# Patient Record
Sex: Female | Born: 1935 | Race: White | Hispanic: No | Smoking: Current every day smoker
Health system: Southern US, Community
[De-identification: ages and names within clinical notes are randomized; demographics above are authoritative.]

## PROBLEM LIST (undated history)

## (undated) DIAGNOSIS — R51 Headache: Secondary | ICD-10-CM

## (undated) DIAGNOSIS — R0600 Dyspnea, unspecified: Secondary | ICD-10-CM

## (undated) DIAGNOSIS — C801 Malignant (primary) neoplasm, unspecified: Secondary | ICD-10-CM

## (undated) DIAGNOSIS — H9190 Unspecified hearing loss, unspecified ear: Secondary | ICD-10-CM

## (undated) DIAGNOSIS — K219 Gastro-esophageal reflux disease without esophagitis: Secondary | ICD-10-CM

## (undated) DIAGNOSIS — Z8719 Personal history of other diseases of the digestive system: Secondary | ICD-10-CM

## (undated) DIAGNOSIS — I2510 Atherosclerotic heart disease of native coronary artery without angina pectoris: Secondary | ICD-10-CM

## (undated) DIAGNOSIS — M1990 Unspecified osteoarthritis, unspecified site: Secondary | ICD-10-CM

## (undated) DIAGNOSIS — R1310 Dysphagia, unspecified: Secondary | ICD-10-CM

## (undated) DIAGNOSIS — F329 Major depressive disorder, single episode, unspecified: Secondary | ICD-10-CM

## (undated) DIAGNOSIS — I219 Acute myocardial infarction, unspecified: Secondary | ICD-10-CM

## (undated) DIAGNOSIS — R519 Headache, unspecified: Secondary | ICD-10-CM

## (undated) DIAGNOSIS — K5790 Diverticulosis of intestine, part unspecified, without perforation or abscess without bleeding: Secondary | ICD-10-CM

## (undated) DIAGNOSIS — Z972 Presence of dental prosthetic device (complete) (partial): Secondary | ICD-10-CM

## (undated) DIAGNOSIS — K922 Gastrointestinal hemorrhage, unspecified: Secondary | ICD-10-CM

## (undated) DIAGNOSIS — D649 Anemia, unspecified: Secondary | ICD-10-CM

## (undated) DIAGNOSIS — F32A Depression, unspecified: Secondary | ICD-10-CM

## (undated) DIAGNOSIS — J449 Chronic obstructive pulmonary disease, unspecified: Secondary | ICD-10-CM

## (undated) DIAGNOSIS — I10 Essential (primary) hypertension: Secondary | ICD-10-CM

## (undated) DIAGNOSIS — E785 Hyperlipidemia, unspecified: Secondary | ICD-10-CM

## (undated) DIAGNOSIS — M810 Age-related osteoporosis without current pathological fracture: Secondary | ICD-10-CM

## (undated) HISTORY — PX: PILONIDAL CYST EXCISION: SHX744

## (undated) HISTORY — PX: CHOLECYSTECTOMY: SHX55

## (undated) HISTORY — PX: ABDOMINAL HYSTERECTOMY: SHX81

## (undated) HISTORY — PX: ESOPHAGOGASTRODUODENOSCOPY: SHX1529

## (undated) HISTORY — PX: APPENDECTOMY: SHX54

## (undated) HISTORY — PX: GANGLION CYST EXCISION: SHX1691

## (undated) HISTORY — PX: COLONOSCOPY: SHX174

---

## 2004-07-03 ENCOUNTER — Other Ambulatory Visit: Payer: Self-pay

## 2006-08-06 ENCOUNTER — Ambulatory Visit: Payer: Self-pay | Admitting: Family Medicine

## 2007-03-01 ENCOUNTER — Ambulatory Visit: Payer: Self-pay | Admitting: Gastroenterology

## 2007-03-26 ENCOUNTER — Ambulatory Visit: Payer: Self-pay | Admitting: Gastroenterology

## 2007-11-11 ENCOUNTER — Ambulatory Visit: Payer: Self-pay | Admitting: Family Medicine

## 2008-02-08 ENCOUNTER — Ambulatory Visit: Payer: Self-pay

## 2008-05-01 ENCOUNTER — Ambulatory Visit: Payer: Self-pay | Admitting: Family Medicine

## 2008-10-08 ENCOUNTER — Ambulatory Visit: Payer: Self-pay | Admitting: Family Medicine

## 2008-10-12 ENCOUNTER — Ambulatory Visit: Payer: Self-pay | Admitting: Internal Medicine

## 2009-07-11 ENCOUNTER — Ambulatory Visit: Payer: Self-pay | Admitting: Unknown Physician Specialty

## 2010-09-02 ENCOUNTER — Ambulatory Visit: Payer: Self-pay | Admitting: Internal Medicine

## 2010-09-04 ENCOUNTER — Ambulatory Visit: Payer: Self-pay | Admitting: Internal Medicine

## 2010-09-06 ENCOUNTER — Ambulatory Visit: Payer: Self-pay | Admitting: Internal Medicine

## 2010-12-18 ENCOUNTER — Ambulatory Visit: Payer: Self-pay | Admitting: Gastroenterology

## 2010-12-20 LAB — PATHOLOGY REPORT

## 2012-06-15 ENCOUNTER — Observation Stay: Payer: Self-pay | Admitting: Internal Medicine

## 2012-06-15 LAB — COMPREHENSIVE METABOLIC PANEL
Alkaline Phosphatase: 65 U/L (ref 50–136)
Anion Gap: 6 — ABNORMAL LOW (ref 7–16)
Bilirubin,Total: 0.6 mg/dL (ref 0.2–1.0)
Calcium, Total: 8.8 mg/dL (ref 8.5–10.1)
Chloride: 105 mmol/L (ref 98–107)
Co2: 28 mmol/L (ref 21–32)
EGFR (African American): >60
EGFR (Non-African Amer.): 59 — ABNORMAL LOW
Glucose: 76 mg/dL (ref 65–99)
Osmolality: 276 (ref 275–301)
Potassium: 3.5 mmol/L (ref 3.5–5.1)
Sodium: 139 mmol/L (ref 136–145)

## 2012-06-15 LAB — CBC
HCT: 40.9 % (ref 35.0–47.0)
MCHC: 34.6 g/dL (ref 32.0–36.0)
RBC: 4.79 10*6/uL (ref 3.80–5.20)
RDW: 14.5 % (ref 11.5–14.5)
WBC: 7.8 10*3/uL (ref 3.6–11.0)

## 2012-06-15 LAB — CK TOTAL AND CKMB (NOT AT ARMC): CK, Total: 51 U/L (ref 21–215)

## 2012-06-15 LAB — PROTIME-INR: Prothrombin Time: 14.5 secs (ref 11.5–14.7)

## 2012-06-15 LAB — TROPONIN I: Troponin-I: 0.03 ng/mL

## 2012-06-16 LAB — CBC WITH DIFFERENTIAL/PLATELET
Basophil %: 0.6 %
Eosinophil %: 3 %
HGB: 13.5 g/dL (ref 12.0–16.0)
Lymphocyte #: 1.9 10*3/uL (ref 1.0–3.6)
MCV: 85 fL (ref 80–100)
Monocyte %: 9.7 %
Neutrophil %: 57.3 %
Platelet: 149 10*3/uL — ABNORMAL LOW (ref 150–440)
RBC: 4.68 10*6/uL (ref 3.80–5.20)
RDW: 14.3 % (ref 11.5–14.5)
WBC: 6.6 10*3/uL (ref 3.6–11.0)

## 2012-06-16 LAB — BASIC METABOLIC PANEL
Anion Gap: 6 — ABNORMAL LOW (ref 7–16)
Co2: 27 mmol/L (ref 21–32)
Creatinine: 0.94 mg/dL (ref 0.60–1.30)
EGFR (African American): >60
EGFR (Non-African Amer.): 58 — ABNORMAL LOW
Glucose: 91 mg/dL (ref 65–99)
Osmolality: 284 (ref 275–301)
Potassium: 4.2 mmol/L (ref 3.5–5.1)
Sodium: 143 mmol/L (ref 136–145)

## 2012-06-16 LAB — HEMOGLOBIN A1C: Hemoglobin A1C: 5 % (ref 4.2–6.3)

## 2012-06-16 LAB — CK TOTAL AND CKMB (NOT AT ARMC)
CK, Total: 56 U/L (ref 21–215)
CK, Total: 64 U/L (ref 21–215)
CK-MB: 1 ng/mL (ref 0.5–3.6)

## 2012-06-16 LAB — MAGNESIUM: Magnesium: 2 mg/dL

## 2012-06-16 LAB — LIPID PANEL
Cholesterol: 123 mg/dL (ref 0–200)
Triglycerides: 147 mg/dL (ref 0–200)

## 2012-06-16 LAB — TROPONIN I: Troponin-I: 0.03 ng/mL

## 2012-11-05 ENCOUNTER — Ambulatory Visit: Payer: Self-pay | Admitting: Gastroenterology

## 2012-11-25 ENCOUNTER — Ambulatory Visit: Payer: Self-pay | Admitting: Gastroenterology

## 2013-11-27 ENCOUNTER — Inpatient Hospital Stay: Payer: Self-pay | Admitting: Internal Medicine

## 2013-11-27 LAB — CBC
HCT: 39.1 % (ref 35.0–47.0)
HGB: 12.8 g/dL (ref 12.0–16.0)
MCH: 28.3 pg (ref 26.0–34.0)
MCHC: 32.7 g/dL (ref 32.0–36.0)
MCV: 86 fL (ref 80–100)
Platelet: 133 10*3/uL — ABNORMAL LOW (ref 150–440)
RBC: 4.53 10*6/uL (ref 3.80–5.20)
RDW: 14.4 % (ref 11.5–14.5)
WBC: 7 10*3/uL (ref 3.6–11.0)

## 2013-11-27 LAB — URINALYSIS, COMPLETE
BACTERIA: NONE SEEN
BLOOD: NEGATIVE
Bilirubin,UR: NEGATIVE
GLUCOSE, UR: NEGATIVE mg/dL (ref 0–75)
Ketone: NEGATIVE
Leukocyte Esterase: NEGATIVE
Nitrite: NEGATIVE
Ph: 5 (ref 4.5–8.0)
Protein: 30
RBC,UR: 1 /HPF (ref 0–5)
SPECIFIC GRAVITY: 1.021 (ref 1.003–1.030)
Squamous Epithelial: <1
WBC UR: 2 /HPF (ref 0–5)

## 2013-11-27 LAB — BASIC METABOLIC PANEL
Anion Gap: 5 — ABNORMAL LOW (ref 7–16)
BUN: 16 mg/dL (ref 7–18)
CO2: 25 mmol/L (ref 21–32)
Calcium, Total: 8.1 mg/dL — ABNORMAL LOW (ref 8.5–10.1)
Chloride: 108 mmol/L — ABNORMAL HIGH (ref 98–107)
Creatinine: 1.13 mg/dL (ref 0.60–1.30)
EGFR (African American): 54 — ABNORMAL LOW
EGFR (Non-African Amer.): 46 — ABNORMAL LOW
Glucose: 99 mg/dL (ref 65–99)
Osmolality: 277 (ref 275–301)
Potassium: 4.5 mmol/L (ref 3.5–5.1)
SODIUM: 138 mmol/L (ref 136–145)

## 2013-11-27 LAB — TROPONIN I: Troponin-I: 0.03 ng/mL

## 2013-11-28 LAB — BASIC METABOLIC PANEL
ANION GAP: 5 — AB (ref 7–16)
BUN: 18 mg/dL (ref 7–18)
CALCIUM: 8.2 mg/dL — AB (ref 8.5–10.1)
Chloride: 106 mmol/L (ref 98–107)
Co2: 26 mmol/L (ref 21–32)
Creatinine: 1.18 mg/dL (ref 0.60–1.30)
EGFR (African American): 51 — ABNORMAL LOW
EGFR (Non-African Amer.): 44 — ABNORMAL LOW
Glucose: 216 mg/dL — ABNORMAL HIGH (ref 65–99)
Osmolality: 282 (ref 275–301)
Potassium: 3.9 mmol/L (ref 3.5–5.1)
Sodium: 137 mmol/L (ref 136–145)

## 2014-10-04 DIAGNOSIS — E782 Mixed hyperlipidemia: Secondary | ICD-10-CM | POA: Insufficient documentation

## 2015-01-23 NOTE — Discharge Summary (Signed)
PATIENT NAME:  Kari Mcdonald, Kari Mcdonald MR#:  [id number] DATE OF BIRTH:  January 30, 1932  DATE OF ADMISSION:  06/15/2012 DATE OF DISCHARGE:  06/17/2012  DISCHARGE DIAGNOSIS: Noncardiac chest pain.  PRESENTATION AND HISTORY:  81 year old female with past medical history of significant chronic obstructive pulmonary disease, ongoing smoking, history of non-ST elevation myocardial infarction, and hypertension presented to the hospital secondary to chest pain that started this morning. It started like sharp chest pain with some shortness of breath and numbness in the left arm. She called EMS and EMS gave her sublingual nitroglycerin and the pain was relieved with that.  She was brought to the ER and admitted to medical service for further work-up for possible cardiac event. She was admitted under observation.  HOSPITAL COURSE:  All three troponins remained negative and she remained pain-free during the hospital stay. We did a LexiScan nuclear medicine scan checking for any ischemia, which was negative for any ischemia or arrhythmia. She had an ejection fraction of 48%. She was discharged with her regular home medications.  Other medical problems addressed during this hospital stay were: 1. History of hypertension. We continued her home medications like hydrochlorothiazide, triamterene, atenolol, and enalapril. 2. Chronic obstructive pulmonary disease: She was stable and there was no wheezing, so we just continued her Advair and albuterol. 3. History of tobacco use. She was placed on a nicotine patch while in the hospital. 4. Depression. We continued her trazodone.   CONDITION ON DISCHARGE: Stable.   CODE STATUS: FULL CODE.  MEDICATIONS ON DISCHARGE:  1. Atenolol 50 mg once a day.  2. Enalapril 20 mg once a day.  3. Atorvastatin 20 mg once a day.  4. Omeprazole 20 mg delayed-release once a day. 5. Trazodone 100-mg oral tablet, 2 tabs orally once a day. 6. Hydrochlorothiazide/triamterene 50/75 mg once a  day. 7. Aspirin 81 mg once a day.  8. Nicotine patch once a day for 20 days. 9. Fluticasone formoterol 1 puff inhaled 2 times a day.   HOME OXYGEN: Not needed.  DIET: Low fat, low cholesterol, carbohydrate controlled diet.  Consistency regular.   ACTIVITY: As tolerated.  Return to work in one week.  TIMEFRAME FOR FOLLOWUP:  1 to 2 weeks with her primary medical doctor.    ____________________________ Ceasar Lund. Anselm Jungling, MD vgv:bjt D: 06/18/2012 18:38:58 ET T: 06/19/2012 13:57:52 ET JOB#: [id number]  cc: Ceasar Lund. Anselm Jungling, MD, <Dictator> Fonnie Jarvis. Ilene Qua, MD Vaughan Basta MD ELECTRONICALLY SIGNED 06/29/2012 15:26

## 2015-01-23 NOTE — H&P (Signed)
PATIENT NAME:  Kari Mcdonald, Kari Mcdonald MR#:  [id number] DATE OF BIRTH:  Oct 23, 1931  DATE OF ADMISSION:  06/15/2012  ADMITTING PHYSICIAN: Dr. Gladstone Lighter  PRIMARY CARE PHYSICIAN: [facility] in [place]: Chest pain.  HISTORY OF PRESENT ILLNESS: Kari Mcdonald is a 81 year old Caucasian female with past medical history significant for chronic obstructive pulmonary disease, ongoing smoking and history of NSTEMI and hypertension presented to the hospital secondary to chest pain that started this morning. Patient says her pain started like a sharp chest pain when she woke up this morning under her left breast and also she was a little diaphoretic with some shortness of breath and also had numbness of the left arm so called EMS. The pain lasted up until EMS arrived and she received two sublingual nitro pills. Currently she is chest pain free and does not have any other symptoms. No prior cardiac history other than what she describes as an NSTEMI without any cardiac intervention and was told that she had a stress heart attack. She has been at her baseline without any other changes up until this morning.   PAST MEDICAL HISTORY: 1. Hypertension.  2. Early dementia. 3. Gastroesophageal reflux disease.   4. NSTEMI without any cardiac intervention.  5. Chronic obstructive pulmonary disease. 6. Ongoing smoking.   PAST SURGICAL HISTORY:  1. Cholecystectomy.  2. Appendectomy.  3. Pilonidal cyst removal.  4. Hysterectomy.   SOCIAL HISTORY: Lives at home with grandson. Smokes about 1 pack per day, smoked for more than 60 years. No use of alcohol.   FAMILY HISTORY: Mom with Alzheimer's dementia and dad with non-Hodgkin's lymphoma and no history of any cardiac disease in the family.   ALLERGIES TO MEDICATIONS: She is allergic to penicillin.   CURRENT MEDICATIONS AT HOME:  1. Atenolol 50 mg p.o. daily.  2. Atorvastatin 20 mg p.o. daily.  3. Enalapril 20 mg p.o. daily.   4. Hydrochlorothiazide/triamterene 50/75, 1 tablet p.o. daily.  5. Omeprazole 20 mg p.o. daily.  6. Trazodone 200 mg at bedtime.   REVIEW OF SYSTEMS: CONSTITUTIONAL: No fever, fatigue or weakness. EYES: Uses reading glasses. No glaucoma or cataracts or inflammation. ENT: No tinnitus, epistaxis or discharge. Positive for allergies and some runny nose. RESPIRATORY: Positive for chronic cough from chronic obstructive pulmonary disease and also has chronic obstructive pulmonary disease. No wheeze or hemoptysis. CARDIOVASCULAR: Positive for chest pain. No orthopnea, edema, arrhythmia, palpitations or syncope. GASTROINTESTINAL: No nausea, vomiting, diarrhea, abdominal pain, hematemesis, or melena.. Positive for dysphagia and status post esophageal stricture dilatation. GENITOURINARY: No dysuria, hematuria, renal calculus, frequency, or incontinence. ENDOCRINE: No polyuria, nocturia, thyroid problems, heat or cold intolerance. HEMATOLOGY: No anemia, easy bruising or bleeding. SKIN: No acne, rash, or lesions. MUSCULOSKELETAL: No neck, back pain. Positive for arthritis and pain in the knees. NEUROLOGIC: No numbness, weakness, cerebrovascular accident, transient ischemic attack, or seizures. PSYCHOLOGICAL: No anxiety, insomnia. Positive for history of depression, currently not depressed.   PHYSICAL EXAMINATION:  VITAL SIGNS: Temperature 98 degrees Fahrenheit, pulse 51, respirations 20, blood pressure 127/64, pulse oximetry 95% on room air.   GENERAL: Well-built, well-nourished female lying in bed, not in any acute distress.   HEENT: Normocephalic, atraumatic. Pupils equal, round, reacting to light. Anicteric sclerae. Extraocular movements intact. Oropharynx clear without erythema, mass or exudates.   NECK: Supple. No thyromegaly, JVD, or carotid bruits. No lymphadenopathy.   LUNGS: Moving air bilaterally. No wheeze or crackles. No use of accessory muscles for breathing.   CARDIOVASCULAR: S1, S2.  3/6  systolic murmur. No rubs or gallops.   ABDOMEN: Soft, nontender, nondistended. No hepatosplenomegaly. Normal bowel sounds.   EXTREMITIES: No pedal edema. No clubbing or cyanosis. 2+ dorsalis pedis pulses palpable bilaterally.   SKIN: No acne, rash or lesions.   LYMPH: No cervical lymphadenopathy.   NEUROLOGIC: Cranial nerves intact. No focal motor or sensory deficits.   PSYCHOLOGICAL: Patient is awake, alert, oriented x3.   LABORATORY, DIAGNOSTIC AND RADIOLOGICAL DATA: EKG showing sinus tachycardia with second-degree AV block, heart rate of 53. Sodium 139, potassium 3.5, chloride 105, bicarbonate 28, BUN 13, creatinine 0.93, glucose 76, calcium 8.8.  ALT 12, AST 22, alkaline phosphatase 65, total bilirubin 0.6, albumin 3.1, WBC 7.8, hemoglobin 14.1, hematocrit 40.9, platelet count 198. Magnesium 1.6. INR 1.1, troponin 0.03. CK 51, CK-MB less than 0.5, PTT 30.3.   ASSESSMENT AND PLAN: 81 year old female with past medical history of hypertension, chronic obstructive pulmonary disease and ongoing smoking admitted with chest pain.  1. Chest pain. Admit under observation to telemetry especially with prior history of NSTEMI and risk factors. Could be musculoskeletal pain as well, however, pain is now resolved so will get Myoview in a.m. Continue aspirin.  2. Hypertension. Continue home medications of triamterene, HCTZ, atenolol and enalapril. 3. Chronic obstructive pulmonary disease. She, appears stable without any wheezing. No need to start any steroids. Continue Advair and albuterol p.r.n. inhalers. 4. Tobacco use disorder. Counseled for three minutes. Placed on nicotine patch while in the hospital. 5. CODE STATUS: FULL CODE.     TIME SPENT ON ADMISSION: 50 minutes.   ____________________________ Gladstone Lighter, MD rk:cms D: 06/15/2012 16:51:33 ET T: 06/15/2012 17:22:51 ET JOB#: [id number]  cc: Gladstone Lighter, MD, <Dictator> [place]  MD ELECTRONICALLY SIGNED 06/15/2012 20:41

## 2015-01-27 NOTE — H&P (Signed)
PATIENT NAME:  Kari Mcdonald, Kari Mcdonald MR#:  [id number] DATE OF BIRTH:  [birth year]  DATE OF ADMISSION:  11/27/2013  PRIMARY CARE PHYSICIAN:  Dr. Thereasa Distance.   REFERRING PHYSICIAN:  Dr. Lisa Roca.   CHIEF COMPLAINT:  Cough, shortness of breath.   HISTORY OF PRESENT ILLNESS:  Kari Mcdonald is a 81 year old female who comes to the Emergency Department with complaints of cough, shortness of breath for the last one week.  The patient has been having cough with productive sputum of greenish sputum.  Today, the patient was experiencing severe tightness in the chest associated with severe shortness of breath.  The patient was also noted to have a fever of 102.  The patient was given ibuprofen before bringing to the Emergency Department.  Work-up in the Emergency Department with chest x-ray, no acute abnormalities were noted.  The patient was diffusely wheezing, was given breathing treatments.   PAST MEDICAL HISTORY: 1.  Hypertension.  2.  COPD.  3.  Hyperlipidemia.  4.  Gastroesophageal reflux disease.  5.  Depression, anxiety.   PAST SURGICAL HISTORY:  1.  Hysterectomy.  2.  Cholecystectomy.  3.  Appendectomy.  4.  Cyst removed from the arm and supine.   5.  Pilonidal cystectomy.   ALLERGIES:  PENICILLIN.   HOME MEDICATIONS:  1.  Trazodone 100 mg 2 tablets at bedtime.  2.  Prednisone 40 mg for four days. 3.  Omeprazole 20 mg once a day.  4.  Nicotine 21 mcg patch transdermal once a day.  5.  Triamterene/hydrochlorothiazide 1 tablet once a day.  6.  Advair Diskus 1 puff 2 times a day.  7.  Enalapril 20 mg once a day.  8.  Atorvastatin 20 mg once a day.  9.  Atenolol 50 mg once a day.  10.  Aspirin 81 mg once a day.   SOCIAL HISTORY:  Continues to smoke more than 2 packs a day.  Denies drinking alcohol or using illicit drugs.  Lives with her niece.   FAMILY HISTORY:  Father and another sister died from lung cancer.  Another sister died from melanoma.   REVIEW OF  SYSTEMS: CONSTITUTIONAL:  Generalized weakness.  EYES:  No change in vision.  EARS, NOSE, THROAT:  No change in hearing.  RESPIRATORY:  Has cough, shortness of breath.  CARDIOVASCULAR:  No chest pain, palpitations.  GASTROINTESTINAL:  No nausea, vomiting, abdominal pain.   GENITORUINARY:  No dysuria or hematuria.  SKIN:  No rashes or lesions.  ENDOCRINE:  No polyuria or polydipsia.  SKIN:  No rash or lesions.  MUSCULOSKELETAL:  Has joint pains and aches.  NEUROLOGIC:  No weakness or numbness in any part of the body.   PHYSICAL EXAMINATION: GENERAL:  This is a well-built, well-nourished, age-appropriate female lying down in the bed not in distress.  VITAL SIGNS:  Temperature 98.3, pulse 78, blood pressure 139/73, respiratory rate of 16, oxygen saturation is 93% on 2 liters of oxygen.  HEENT:  Head normocephalic, atraumatic.  Eyes, no scleral icterus.  Conjunctivae normal.  Pupils equal and reactive.  Extraocular movements are intact.  Mucous membranes moist.  No pharyngeal erythema.  NECK:  Supple.  No lymphadenopathy.  No JVD.  No carotid bruit.  CHEST:  Has no focal tenderness.  LUNGS:  Bilateral diffuse wheezing.  HEART:  S1, S2 regular.  No murmurs are heard.  Tachycardia.  ABDOMEN:  Bowel sounds plus.  Soft, nontender, nondistended.  No hepatosplenomegaly.  EXTREMITIES:  No pedal edema.  Pulses  2+.  NEUROLOGIC:  The patient is alert, oriented to place, person and time.  Cranial nerves II through XII intact.  Motor 5 by 5 in upper and lower extremities.  SKIN:  No rash or lesions.  MUSCULOSKELETAL:  Good range of motion in all the extremities.   LABORATORY DATA:  UA negative for nitrites and leukocyte esterase.  Chest x-ray, one view portable:  No acute cardiopulmonary disease.  CT head without contrast:  No acute intracranial abnormality, atrophy and chronic small vessel disease.  BMP is completely within normal limits.  CBC completely within normal limits.   ASSESSMENT AND PLAN:   Kari Mcdonald is a 81 year old female who comes to the Emergency Department with chronic obstructive pulmonary disease exacerbation.  1.  Chronic obstructive pulmonary disease exacerbation:  Continue with the breathing treatments, Solu-Medrol, Rocephin and Zithromax.  2.  Tobacco use.  Counseled with the patient.  3.  Hypertension.  Continue the home medications.  4.  Keep the patient on deep vein thrombosis prophylaxis with Lovenox.   TIME SPENT:  45 minutes.   ____________________________ Monica Becton, MD pv:ea D: 11/27/2013 22:58:53 ET T: 11/28/2013 02:13:45 ET JOB#: [id number]  cc: Monica Becton, MD, <Dictator> Sofie Hartigan, MD Monica Becton MD ELECTRONICALLY SIGNED 12/08/2013 1:31

## 2015-01-27 NOTE — Discharge Summary (Signed)
PATIENT NAME:  Kari Mcdonald, Kari Mcdonald MR#:  [id number] DATE OF BIRTH:  12/07/1931  DATE OF ADMISSION:  11/27/2013 DATE OF DISCHARGE:  11/29/2013  DISCHARGE DIAGNOSES: 1. Chronic obstructive pulmonary disease exacerbation.  2. Hypertension.  3. Hyperlipidemia.  4. Tobacco abuse.   CONDITION ON DISCHARGE: Stable.   CODE STATUS: FULL CODE   DISCHARGE MEDICATIONS: 1. Enalapril 20 mg oral once a day.  2. Atorvastatin 20 mg oral once a day.  3. Trazodone 100 mg 2 tablets once a day.  4. Hydrochlorothiazide and triamterene 50/75 mg once a day.  5. Aspirin 81 mg once a day  6. Prednisone 10 mg oral tablet, start at 60 mg and taper x 10 mg daily until complete.  7. Spiriva 18 mcg inhalation capsule once a day  8. Advair 1 puff inhalation 2 times a day.  9. ProAir 2 puffs inhalation 4 times a day as needed for shortness of breath. 11. Nicotine patch once a day for 15 days.   HOME HEALTH ON DISCHARGE: Yes. Advised to have physical therapy and nurse aide on discharge.  DIET ON DISCHARGE: Low-sodium, low-fat, low-cholesterol, regular consistency diet.   ACTIVITY: As tolerated.   Advised to follow routine follow-ups with primary care physician within 1 to 2 weeks.   HISTORY OF PRESENT ILLNESS: The patient is a 81 year old female came to the Emergency Department with complaint of cough and shortness of breath for the last one week. She had been having cough with productive sputum with greenish sputum, was also experiencing severe tightness in her chest, associated with shortness of breath and fever of 102 degrees Fahrenheit. In ER initial work-up chest x-ray there was no abnormality. The patient was diffuse wheezing and so given breathing treatment and admitted to medical service.  HOSPITAL COURSE AND STAY:  1. Chronic obstructive pulmonary disease exacerbation. Breathing treatment, and Solu-Medrol and IV ceftriaxone and azithromycin and the patient with significant improvement in overall  condition and so discharged home with tapering steroid and oral antibiotic.  2. Hypertension. We continued home medication.  3. Hyperlipidemia. Continue atorvastatin. 4. Smoking.  Counseling was done for smoking cessation.   IMPORTANT LABORATORY RESULTS IN THE HOSPITAL STAY: WBC count was 7, hemoglobin was 12.8, platelet count was 133, MCV was 86. Troponin 0.03, glucose 99, creatinine 1.13, sodium 138, potassium 4.5, chloride 108 and CO2 of 28.   Urinalysis grossly negative.   CT of the chest, PA and lateral, no acute abnormality.   TOTAL TIME SPENT ON THIS DISCHARGE: 40 minutes.   ____________________________ Ceasar Lund Anselm Jungling, MD vgv:sg D: 12/03/2013 21:37:40 ET T: 12/04/2013 07:28:44 ET JOB#: [id number]  cc: Ceasar Lund. Anselm Jungling, MD, <Dictator> Vaughan Basta MD ELECTRONICALLY SIGNED 12/12/2013 7:46

## 2015-08-09 ENCOUNTER — Ambulatory Visit
Admission: EM | Admit: 2015-08-09 | Discharge: 2015-08-09 | Disposition: A | Discharge status: Home / Self Care | Payer: Medicare Other | Attending: Family Medicine | Admitting: Family Medicine

## 2015-08-09 ENCOUNTER — Other Ambulatory Visit: Payer: Self-pay

## 2015-08-09 ENCOUNTER — Ambulatory Visit (INDEPENDENT_AMBULATORY_CARE_PROVIDER_SITE_OTHER): Payer: Medicare Other

## 2015-08-09 ENCOUNTER — Encounter: Payer: Self-pay | Admitting: Emergency Medicine

## 2015-08-09 DIAGNOSIS — J45901 Unspecified asthma with (acute) exacerbation: Secondary | ICD-10-CM | POA: Insufficient documentation

## 2015-08-09 DIAGNOSIS — F17200 Nicotine dependence, unspecified, uncomplicated: Secondary | ICD-10-CM | POA: Insufficient documentation

## 2015-08-09 DIAGNOSIS — R05 Cough: Secondary | ICD-10-CM | POA: Diagnosis present

## 2015-08-09 DIAGNOSIS — K2990 Gastroduodenitis, unspecified, without bleeding: Secondary | ICD-10-CM | POA: Diagnosis not present

## 2015-08-09 DIAGNOSIS — R0602 Shortness of breath: Secondary | ICD-10-CM

## 2015-08-09 DIAGNOSIS — J441 Chronic obstructive pulmonary disease with (acute) exacerbation: Secondary | ICD-10-CM | POA: Diagnosis not present

## 2015-08-09 DIAGNOSIS — R109 Unspecified abdominal pain: Secondary | ICD-10-CM | POA: Insufficient documentation

## 2015-08-09 DIAGNOSIS — K2970 Gastritis, unspecified, without bleeding: Secondary | ICD-10-CM

## 2015-08-09 DIAGNOSIS — J449 Chronic obstructive pulmonary disease, unspecified: Secondary | ICD-10-CM | POA: Diagnosis present

## 2015-08-09 HISTORY — DX: Essential (primary) hypertension: I10

## 2015-08-09 MED ORDER — IPRATROPIUM-ALBUTEROL 0.5-2.5 (3) MG/3ML IN SOLN
3.0000 mL | Freq: Once | RESPIRATORY_TRACT | Status: AC
  Administered 2015-08-09: 3 mL via RESPIRATORY_TRACT

## 2015-08-09 MED ORDER — GI COCKTAIL ~~LOC~~
30.0000 mL | Freq: Once | ORAL | Status: AC
  Administered 2015-08-09: 30 mL via ORAL

## 2015-08-09 MED ORDER — PREDNISONE 10 MG (21) PO TBPK: ORAL_TABLET | ORAL | Status: DC

## 2015-08-09 MED ORDER — BENZONATATE 200 MG PO CAPS: 200.0000 mg | ORAL_CAPSULE | Freq: Three times a day (TID) | ORAL | Status: DC | PRN

## 2015-08-09 MED ORDER — SUCRALFATE 1 G PO TABS: 2.0000 g | ORAL_TABLET | Freq: Two times a day (BID) | ORAL | Status: DC

## 2015-08-09 MED ORDER — METHYLPREDNISOLONE SODIUM SUCC 125 MG IJ SOLR
125.0000 mg | Freq: Once | INTRAMUSCULAR | Status: AC
  Administered 2015-08-09: 125 mg via INTRAMUSCULAR

## 2015-08-09 MED ORDER — AZITHROMYCIN 250 MG PO TABS: ORAL_TABLET | ORAL | Status: DC

## 2015-08-09 NOTE — ED Notes (Signed)
Pt with sob x days and cough

## 2015-08-09 NOTE — Discharge Instructions (Signed)
Abdominal Pain, Adult Many things can cause belly (abdominal) pain. Most times, the belly pain is not dangerous. Many cases of belly pain can be watched and treated at home. HOME CARE   Do not take medicines that help you go poop (laxatives) unless told to by your doctor.  Only take medicine as told by your doctor.  Eat or drink as told by your doctor. Your doctor will tell you if you should be on a special diet. GET HELP IF:  You do not know what is causing your belly pain.  You have belly pain while you are sick to your stomach (nauseous) or have runny poop (diarrhea).  You have pain while you pee or poop.  Your belly pain wakes you up at night.  You have belly pain that gets worse or better when you eat.  You have belly pain that gets worse when you eat fatty foods.  You have a fever. GET HELP RIGHT AWAY IF:   The pain does not go away within 2 hours.  You keep throwing up (vomiting).  The pain changes and is only in the right or left part of the belly.  You have bloody or tarry looking poop. MAKE SURE YOU:   Understand these instructions.  Will watch your condition.  Will get help right away if you are not doing well or get worse.   This information is not intended to replace advice given to you by your health care provider. Make sure you discuss any questions you have with your health care provider.   Document Released: 03/10/2008 Document Revised: 10/13/2014 Document Reviewed: 06/01/2013 Elsevier Interactive Patient Education 2016 [employer]  Chronic Obstructive Pulmonary Disease Exacerbation Chronic obstructive pulmonary disease (COPD) is a common lung problem. In COPD, the flow of air from the lungs is limited. COPD exacerbations are times that breathing gets worse and you need extra treatment. Without treatment they can be life threatening. If they happen often, your lungs can become more damaged. If your COPD gets worse, your doctor may treat you  with:  Medicines.  Oxygen.  Different ways to clear your airway, such as using a mask. HOME CARE  Do not smoke.  Avoid tobacco smoke and other things that bother your lungs.  If given, take your antibiotic medicine as told. Finish the medicine even if you start to feel better.  Only take medicines as told by your doctor.  Drink enough fluids to keep your pee (urine) clear or pale yellow (unless your doctor has told you not to).  Use a cool mist machine (vaporizer).  If you use oxygen or a machine that turns liquid medicine into a mist (nebulizer), continue to use them as told.  Keep up with shots (vaccinations) as told by your doctor.  Exercise regularly.  Eat healthy foods.  Keep all doctor visits as told. GET HELP RIGHT AWAY IF:  You are very short of breath and it gets worse.  You have trouble talking.  You have bad chest pain.  You have blood in your spit (sputum).  You have a fever.  You keep throwing up (vomiting).  You feel weak, or you pass out (faint).  You feel confused.  You keep getting worse. MAKE SURE YOU:  Understand these instructions.  Will watch your condition.  Will get help right away if you are not doing well or get worse.   This information is not intended to replace advice given to you by your health care provider. Make  sure you discuss any questions you have with your health care provider.   Document Released: 09/11/2011 Document Revised: 10/13/2014 Document Reviewed: 05/27/2013 Elsevier Interactive Patient Education 2016 [employer]  Chronic Obstructive Pulmonary Disease Chronic obstructive pulmonary disease (COPD) is a common lung problem. In COPD, the flow of air from the lungs is limited. The way your lungs work will probably never return to normal, but there are things you can do to improve your lungs and make yourself feel better. Your doctor may treat your condition with:  Medicines.  Oxygen.  Lung  surgery.  Changes to your diet.  Rehabilitation. This may involve a team of specialists. HOME CARE  Take all medicines as told by your doctor.  Avoid medicines or cough syrups that dry up your airway (such as antihistamines) and do not allow you to get rid of thick spit. You do not need to avoid them if told differently by your doctor.  If you smoke, stop. Smoking makes the problem worse.  Avoid being around things that make your breathing worse (like smoke, chemicals, and fumes).  Use oxygen therapy and therapy to help improve your lungs (pulmonary rehabilitation) if told by your doctor. If you need home oxygen therapy, ask your doctor if you should buy a tool to measure your oxygen level (oximeter).  Avoid people who have a sickness you can catch (contagious).  Avoid going outside when it is very hot, cold, or humid.  Eat healthy foods. Eat smaller meals more often. Rest before meals.  Stay active, but remember to also rest.  Make sure to get all the shots (vaccines) your doctor recommends. Ask your doctor if you need a pneumonia shot.  Learn and use tips on how to relax.  Learn and use tips on how to control your breathing as told by your doctor. Try:  Breathing in (inhaling) through your nose for 1 second. Then, pucker your lips and breath out (exhale) through your lips for 2 seconds.  Putting one hand on your belly (abdomen). Breathe in slowly through your nose for 1 second. Your hand on your belly should move out. Pucker your lips and breathe out slowly through your lips. Your hand on your belly should move in as you breathe out.  Learn and use controlled coughing to clear thick spit from your lungs. The steps are:  Lean your head a little forward.  Breathe in deeply.  Try to hold your breath for 3 seconds.  Keep your mouth slightly open while coughing 2 times.  Spit any thick spit out into a tissue.  Rest and do the steps again 1 or 2 times as needed. GET HELP  IF:  You cough up more thick spit than usual.  There is a change in the color or thickness of the spit.  It is harder to breathe than usual.  Your breathing is faster than usual. GET HELP RIGHT AWAY IF:  You have shortness of breath while resting.  You have shortness of breath that stops you from:  Being able to talk.  Doing normal activities.  You chest hurts for longer than 5 minutes.  Your skin color is more blue than usual.  Your pulse oximeter shows that you have low oxygen for longer than 5 minutes. MAKE SURE YOU:  Understand these instructions.  Will watch your condition.  Will get help right away if you are not doing well or get worse.   This information is not intended to replace advice given to you  by your health care provider. Make sure you discuss any questions you have with your health care provider.   Document Released: 03/10/2008 Document Revised: 10/13/2014 Document Reviewed: 05/19/2013 Elsevier Interactive Patient Education 2016 [place] of Breath Shortness of breath means you have trouble breathing. Shortness of breath needs medical care right away. HOME CARE   Do not smoke.  Avoid being around chemicals or things (paint fumes, dust) that may bother your breathing.  Rest as needed. Slowly begin your normal activities.  Only take medicines as told by your doctor.  Keep all doctor visits as told. GET HELP RIGHT AWAY IF:   Your shortness of breath gets worse.  You feel lightheaded, pass out (faint), or have a cough that is not helped by medicine.  You cough up blood.  You have pain with breathing.  You have pain in your chest, arms, shoulders, or belly (abdomen).  You have a fever.  You cannot walk up stairs or exercise the way you normally do.  You do not get better in the time expected.  You have a hard time doing normal activities even with rest.  You have problems with your medicines.  You have any new  symptoms. MAKE SURE YOU:  Understand these instructions.  Will watch your condition.  Will get help right away if you are not doing well or get worse.   This information is not intended to replace advice given to you by your health care provider. Make sure you discuss any questions you have with your health care provider.   Document Released: 03/10/2008 Document Revised: 09/27/2013 Document Reviewed: 12/08/2011 Elsevier Interactive Patient Education [employer].

## 2015-08-09 NOTE — ED Provider Notes (Signed)
CSN: [id number]     Arrival date & time 08/09/15  1238 History   First MD Initiated Contact with Patient 08/09/15 1311    Nurses notes were reviewed. Chief Complaint  Patient presents with  . Cough   patient is an 81 year old white female history of COPD who comes in shortness of breath and difficult to breathing for about a week. She still smoking and adamantly stresses that she is going to continue to smoke. She has a Spiriva inhaler and a beta agonist inhaler which she has used during this time she states hasn't really helped her much if any. States that she's been coughing the sputum has been clear and no color to it.    she also has a history of indigestion for about 3 weeks states she has difficulty eating and she had increased belching as well. She's tried antiacids over-the-counter and did not help.Marland Kitchen Apparently this morning the indigestion got so bad she called her PCPs office at the [facility]. She told him that she was having difficult to breathing the told to go to the urgent cares to the emergency room as explained to her and her daughter-in-law within the more appropriate place to go.  She denies chest pain and just having wheezing but EKG will be done due to her age and because of epigastric pain even last going on for about 3 weeks.   (Consider location/radiation/quality/duration/timing/severity/associated sxs/prior Treatment) Patient is a 81 y.o. female presenting with cough and abdominal pain. The history is provided by the patient and a relative. No language interpreter was used.  Cough Cough characteristics:  Non-productive Severity:  Moderate Onset quality: About a week. Timing:  Constant Progression:  Worsening Chronicity:  New Smoker: yes   Context: smoke exposure, upper respiratory infection and with activity   Context: not weather changes   Relieved by:  Nothing Worsened by:  Deep breathing, smoking and activity Ineffective treatments:  Beta-agonist inhaler and  ipratropium inhaler Associated symptoms: shortness of breath and wheezing   Associated symptoms: no sinus congestion   Shortness of breath:    Severity:  Moderate   Onset quality:  Sudden   Timing:  Constant   Progression:  Worsening Risk factors: recent infection   Risk factors: no chemical exposure   Abdominal Pain Pain location:  Epigastric Pain quality: aching and burning   Pain radiates to:  Epigastric region Pain severity:  Moderate Onset quality:  Sudden Progression:  Waxing and waning Context: not alcohol use and not medication withdrawal   Relieved by:  Nothing Worsened by:  Belching Ineffective treatments:  OTC medications Associated symptoms: cough and shortness of breath     Past Medical History  Diagnosis Date  . Hypertension    Past Surgical History  Procedure Laterality Date  . Abdominal hysterectomy    . Cholecystectomy     History reviewed. No pertinent family history. Social History  Substance Use Topics  . Smoking status: Current Every Day Smoker  . Smokeless tobacco: None  . Alcohol Use: No   OB History    No data available     Review of Systems  Respiratory: Positive for cough, shortness of breath and wheezing.   Gastrointestinal: Positive for abdominal pain.  All other systems reviewed and are negative.   Allergies  Penicillins  Home Medications   Prior to Admission medications   Medication Sig Start Date End Date Taking? Authorizing Provider  azithromycin (ZITHROMAX Z-PAK) 250 MG tablet Take 2 tablets first day and then 1  po a day for 4 days 08/09/15   Frederich Cha, MD  benzonatate (TESSALON) 200 MG capsule Take 1 capsule (200 mg total) by mouth 3 (three) times daily as needed for cough. 08/09/15   Frederich Cha, MD  predniSONE (STERAPRED UNI-PAK 21 TAB) 10 MG (21) TBPK tablet Sig 6 tablet day 1, 5 tablets day 2, 4 tablets day 3,,3tablets day 4, 2 tablets day 5, 1 tablet day 6 take all tablets orally 08/09/15   Frederich Cha, MD  sucralfate  (CARAFATE) 1 G tablet Take 2 tablets (2 g total) by mouth 2 (two) times daily. An hour before eating. 08/09/15   Frederich Cha, MD   Meds Ordered and Administered this Visit   Medications  methylPREDNISolone sodium succinate (SOLU-MEDROL) 125 mg/2 mL injection 125 mg (125 mg Intramuscular Given 08/09/15 1343)  ipratropium-albuterol (DUONEB) 0.5-2.5 (3) MG/3ML nebulizer solution 3 mL (3 mLs Nebulization Given 08/09/15 1343)  gi cocktail (Maalox,Lidocaine,Donnatal) (30 mLs Oral Given 08/09/15 1344)    BP 129/75 mmHg  Pulse 76  Temp(Src) 98 F (36.7 C) (Tympanic)  Resp 22  Ht 5\' 6"  (1.676 m)  Wt 168 lb (76.204 kg)  BMI 27.13 kg/m2  SpO2 95% No data found.   Physical Exam  Constitutional: She is oriented to person, place, and time.  HENT:  Head: Normocephalic and atraumatic.  Right Ear: External ear normal.  Left Ear: External ear normal.  Eyes: Conjunctivae are normal. Pupils are equal, round, and reactive to light.  Neck: Normal range of motion. Neck supple.  Cardiovascular: Normal rate, regular rhythm and normal heart sounds.   Pulmonary/Chest: She has wheezes. She has no rales. She exhibits no tenderness.  Abdominal: Soft. She exhibits no distension. There is no hepatosplenomegaly. There is tenderness in the epigastric area. There is no rebound and no CVA tenderness. A hernia is present.    Some mild tenderness midepigastric normal pathology noted no CVA tenderness  Musculoskeletal: Normal range of motion.  Neurological: She is alert and oriented to person, place, and time.  Skin: Skin is warm.  Psychiatric: She has a normal mood and affect.  Vitals reviewed.   ED Course  Procedures (including critical care time)  Labs Review Labs Reviewed - No data to display  Imaging Review Dg Chest 2 View  08/09/2015  CLINICAL DATA:  Productive cough EXAM: CHEST  2 VIEW COMPARISON:  11/27/2013 FINDINGS: Heart size and vascularity normal. Atherosclerotic aortic arch without aneurysm.  Lungs are clear. No infiltrate, effusion, or mass lesion. No change from the prior study. IMPRESSION: No active cardiopulmonary disease. Electronically Signed   By: Franchot Gallo M.D.   On: 08/09/2015 13:44     Visual Acuity Review  Right Eye Distance:   Left Eye Distance:   Bilateral Distance:    Right Eye Near:   Left Eye Near:    Bilateral Near:     ED ECG REPORT   Date: 08/09/2015  EKG Time: 2:07 PM  Rate: 65  Rhythm: normal sinus rhythm and premature atrial contractions (PAC),  there are no previous tracings available for comparison, normal sinus rhythm, PAC's noted  Axis: 45  Intervals:none  ST&T Change: none  Narrative Interpretation: none            MDM   1. Acute exacerbation of chronic obstructive pulmonary disease (COPD) ([place])   2. Gastritis and gastroduodenitis   3. Abdominal pain, acute   4. Shortness of breath    after breathing treatment patient's wheezes diminished considerably and patient is  doing much better after the GI cocktail as well. EKG no acute changes. Will treat COPD exacerbation warning need to stop smoking, Zithromax for Z-Pak and Tessalon Perles, and 6 a course of prednisone. For the gastritis and gastroduodenitis explained to her and her daughter-in-law she needs to avoid all anti-inflammatories other than the one baby aspirin that's encoated and follow-up PCP for possible GI referral. Will also place on Carafate 2 tablets twice a day since that continues to help the abdominal discomfort.     Frederich Cha, MD 08/09/15 [phone]

## 2015-08-24 ENCOUNTER — Emergency Department: Payer: Medicare Other

## 2015-08-24 ENCOUNTER — Encounter: Payer: Self-pay | Admitting: Emergency Medicine

## 2015-08-24 ENCOUNTER — Emergency Department
Admission: EM | Admit: 2015-08-24 | Discharge: 2015-08-24 | Disposition: A | Discharge status: Home / Self Care | Payer: Medicare Other | Attending: Emergency Medicine | Admitting: Emergency Medicine

## 2015-08-24 DIAGNOSIS — I10 Essential (primary) hypertension: Secondary | ICD-10-CM | POA: Diagnosis not present

## 2015-08-24 DIAGNOSIS — F17200 Nicotine dependence, unspecified, uncomplicated: Secondary | ICD-10-CM | POA: Diagnosis not present

## 2015-08-24 DIAGNOSIS — Z880 Allergy status to penicillin: Secondary | ICD-10-CM | POA: Insufficient documentation

## 2015-08-24 DIAGNOSIS — W1812XA Fall from or off toilet with subsequent striking against object, initial encounter: Secondary | ICD-10-CM | POA: Insufficient documentation

## 2015-08-24 DIAGNOSIS — E871 Hypo-osmolality and hyponatremia: Secondary | ICD-10-CM | POA: Diagnosis not present

## 2015-08-24 DIAGNOSIS — Z7952 Long term (current) use of systemic steroids: Secondary | ICD-10-CM | POA: Diagnosis not present

## 2015-08-24 DIAGNOSIS — Y9289 Other specified places as the place of occurrence of the external cause: Secondary | ICD-10-CM | POA: Diagnosis not present

## 2015-08-24 DIAGNOSIS — Y998 Other external cause status: Secondary | ICD-10-CM | POA: Diagnosis not present

## 2015-08-24 DIAGNOSIS — Z792 Long term (current) use of antibiotics: Secondary | ICD-10-CM | POA: Diagnosis not present

## 2015-08-24 DIAGNOSIS — Z79899 Other long term (current) drug therapy: Secondary | ICD-10-CM | POA: Insufficient documentation

## 2015-08-24 DIAGNOSIS — M25512 Pain in left shoulder: Secondary | ICD-10-CM

## 2015-08-24 DIAGNOSIS — S4992XA Unspecified injury of left shoulder and upper arm, initial encounter: Secondary | ICD-10-CM | POA: Insufficient documentation

## 2015-08-24 DIAGNOSIS — W19XXXA Unspecified fall, initial encounter: Secondary | ICD-10-CM

## 2015-08-24 DIAGNOSIS — Y9389 Activity, other specified: Secondary | ICD-10-CM | POA: Diagnosis not present

## 2015-08-24 HISTORY — DX: Chronic obstructive pulmonary disease, unspecified: J44.9

## 2015-08-24 LAB — CBC
HEMATOCRIT: 42.2 % (ref 35.0–47.0)
HEMOGLOBIN: 14 g/dL (ref 12.0–16.0)
MCH: 28.5 pg (ref 26.0–34.0)
MCHC: 33.2 g/dL (ref 32.0–36.0)
MCV: 85.7 fL (ref 80.0–100.0)
Platelets: 195 10*3/uL (ref 150–440)
RBC: 4.93 MIL/uL (ref 3.80–5.20)
RDW: 14.9 % — AB (ref 11.5–14.5)
WBC: 13.9 10*3/uL — AB (ref 3.6–11.0)

## 2015-08-24 LAB — COMPREHENSIVE METABOLIC PANEL
ALBUMIN: 4 g/dL (ref 3.5–5.0)
ALK PHOS: 59 U/L (ref 38–126)
ALT: 19 U/L (ref 14–54)
AST: 26 U/L (ref 15–41)
Anion gap: 6 (ref 5–15)
BILIRUBIN TOTAL: 0.5 mg/dL (ref 0.3–1.2)
BUN: 13 mg/dL (ref 6–20)
CO2: 28 mmol/L (ref 22–32)
Calcium: 9.4 mg/dL (ref 8.9–10.3)
Chloride: 95 mmol/L — ABNORMAL LOW (ref 101–111)
Creatinine, Ser: 1.02 mg/dL — ABNORMAL HIGH (ref 0.44–1.00)
GFR calc Af Amer: 58 mL/min — ABNORMAL LOW (ref >60)
GFR calc non Af Amer: 50 mL/min — ABNORMAL LOW (ref >60)
GLUCOSE: 118 mg/dL — AB (ref 65–99)
POTASSIUM: 4 mmol/L (ref 3.5–5.1)
Sodium: 129 mmol/L — ABNORMAL LOW (ref 135–145)
TOTAL PROTEIN: 7 g/dL (ref 6.5–8.1)

## 2015-08-24 MED ORDER — HYDROCODONE-ACETAMINOPHEN 5-325 MG PO TABS: 1.0000 | ORAL_TABLET | ORAL | Status: DC | PRN

## 2015-08-24 MED ORDER — ONDANSETRON HCL 4 MG/2ML IJ SOLN
4.0000 mg | Freq: Once | INTRAMUSCULAR | Status: AC
  Administered 2015-08-24: 4 mg via INTRAVENOUS
  Filled 2015-08-24: qty 2

## 2015-08-24 MED ORDER — MORPHINE SULFATE (PF) 4 MG/ML IV SOLN
4.0000 mg | Freq: Once | INTRAVENOUS | Status: AC
  Administered 2015-08-24: 4 mg via INTRAVENOUS
  Filled 2015-08-24: qty 1

## 2015-08-24 MED ORDER — IPRATROPIUM-ALBUTEROL 0.5-2.5 (3) MG/3ML IN SOLN
3.0000 mL | Freq: Once | RESPIRATORY_TRACT | Status: AC
  Administered 2015-08-24: 3 mL via RESPIRATORY_TRACT
  Filled 2015-08-24: qty 3

## 2015-08-24 MED ORDER — SODIUM CHLORIDE 0.9 % IV BOLUS (SEPSIS)
1000.0000 mL | Freq: Once | INTRAVENOUS | Status: AC
  Administered 2015-08-24: 1000 mL via INTRAVENOUS

## 2015-08-24 NOTE — Discharge Instructions (Signed)
You have been seen in the emergency department today for a fall. Your shoulder x-ray shows no fracture, but it does show advanced arthritis. Please wear your arm sling as needed for comfort. Please take Tylenol or Motrin as needed for discomfort. Your sodium level was found to be low today at 129. Please have this rechecked by your primary care doctor on Monday or Tuesday. Return to the emergency Department for any dizziness, lightheadedness, off-balance sensation, or weakness.    Shoulder Pain The shoulder is the joint that connects your arm to your body. Muscles and band-like tissues that connect bones to muscles (tendons) hold the joint together. Shoulder pain is felt if an injury or medical problem affects one or more parts of the shoulder. HOME CARE   Put ice on the sore area.  Put ice in a plastic bag.  Place a towel between your skin and the bag.  Leave the ice on for 15-20 minutes, 03-04 times a day for the first 2 days.  Stop using cold packs if they do not help with the pain.  If you were given something to keep your shoulder from moving (sling; shoulder immobilizer), wear it as told. Only take it off to shower or bathe.  Move your arm as little as possible, but keep your hand moving to prevent puffiness (swelling).  Squeeze a soft ball or foam pad as much as possible to help prevent swelling.  Take medicine as told by your doctor. GET HELP IF:  You have progressing new pain in your arm, hand, or fingers.  Your hand or fingers get cold.  Your medicine does not help lessen your pain. GET HELP RIGHT AWAY IF:   Your arm, hand, or fingers are numb or tingling.  Your arm, hand, or fingers are puffy (swollen), painful, or turn white or blue. MAKE SURE YOU:   Understand these instructions.  Will watch your condition.  Will get help right away if you are not doing well or get worse.   This information is not intended to replace advice given to you by your health care  provider. Make sure you discuss any questions you have with your health care provider.   Document Released: 03/10/2008 Document Revised: 10/13/2014 Document Reviewed: 01/15/2015 Elsevier Interactive Patient Education [employer].

## 2015-08-24 NOTE — ED Notes (Signed)
Pt comes into the ED from home via EMS c/o of a fall this morning off of the commode.  Patient unsure of why she fell and denies any dizziness or shortness of breath.  Denies hitting her head or LOC but does complain of Left scapular pain at a 3/10.  Unremarkable EKG, h/o COPD and HTN.

## 2015-08-24 NOTE — ED Notes (Signed)
Patient transported to X-ray 

## 2015-08-24 NOTE — ED Provider Notes (Signed)
[facility] Emergency Department Provider Note  Time seen: 12:25 PM  I have reviewed the triage vital signs and the nursing notes.   HISTORY  Chief Complaint Fall    HPI Kari Mcdonald is a 81 y.o. female with a past medical history of hypertension, COPD, who presents the emergency department after a fall. According to the patient she was on the toilet, was attempting to get up and slipped falling onto her left side. Complaining of left shoulder pain. Denies head injury loss of consciousness. Denies any other pain complaints. Denies feeling dizzy, weak, chest pain or short of breath. Describes her shoulder pain as dull, moderate, worse with movement.    Past Medical History  Diagnosis Date  . Hypertension   . COPD (chronic obstructive pulmonary disease) (HCC)     There are no active problems to display for this patient.   Past Surgical History  Procedure Laterality Date  . Abdominal hysterectomy    . Cholecystectomy      Current Outpatient Rx  Name  Route  Sig  Dispense  Refill  . azithromycin (ZITHROMAX Z-PAK) 250 MG tablet      Take 2 tablets first day and then 1 po a day for 4 days   6 tablet   0   . benzonatate (TESSALON) 200 MG capsule   Oral   Take 1 capsule (200 mg total) by mouth 3 (three) times daily as needed for cough.   20 capsule   1   . predniSONE (STERAPRED UNI-PAK 21 TAB) 10 MG (21) TBPK tablet      Sig 6 tablet day 1, 5 tablets day 2, 4 tablets day 3,,3tablets day 4, 2 tablets day 5, 1 tablet day 6 take all tablets orally   21 tablet   0   . sucralfate (CARAFATE) 1 G tablet   Oral   Take 2 tablets (2 g total) by mouth 2 (two) times daily. An hour before eating.   120 tablet   0     Allergies Penicillins  No family history on file.  Social History Social History  Substance Use Topics  . Smoking status: Current Every Day Smoker -- 0.50 packs/day  . Smokeless tobacco: None  . Alcohol Use: No    Review  of Systems Constitutional: Negative for fever. Cardiovascular: Negative for chest pain. Respiratory: Negative for shortness of breath. Gastrointestinal: Negative for abdominal pain Genitourinary: Negative for dysuria. Musculoskeletal: Left shoulder pain. Skin: Negative for rash. Neurological: Negative for headache 10-point ROS otherwise negative.  ____________________________________________   PHYSICAL EXAM:  VITAL SIGNS: ED Triage Vitals  Enc Vitals Group     BP --      Pulse --      Resp --      Temp 08/24/15 1151 97.8 F (36.6 C)     Temp Source 08/24/15 1151 Oral     SpO2 --      Weight --      Height --      Head Cir --      Peak Flow --      Pain Score 08/24/15 1154 3     Pain Loc --      Pain Edu? --      Excl. in [place]? --     Constitutional: Alert and oriented. Well appearing and in no distress. Eyes: Normal exam ENT   Head: Normocephalic and atraumatic.   Mouth/Throat: Mucous membranes are moist. Cardiovascular: Normal rate, regular rhythm. No murmur  Respiratory: Normal respiratory effort without tachypnea nor retractions. Breath sounds are clear and equal bilaterally. No wheezes/rales/rhonchi. Gastrointestinal: Soft and nontender. No distention.   Musculoskeletal: Left shoulder pain. Worse with range of motion. No elbow tenderness. Neurovascularly intact with 2+ radial pulse, good grip strengths, equal sensation. Neurologic:  Normal speech and language. No gross focal neurologic deficits Skin:  Skin is warm, dry and intact.  Psychiatric: Mood and affect are normal. Speech and behavior are normal.   ____________________________________________    EKG  EKG reviewed and interpreted by myself shows normal sinus rhythm at 61 bpm, narrow QRS, normal axis, normal intervals, nonspecific ST changes present. No ST elevations.  ____________________________________________    RADIOLOGY  Shoulder x-ray shows arthritis without  fracture  ____________________________________________    INITIAL IMPRESSION / ASSESSMENT AND PLAN / ED COURSE  Pertinent labs & imaging results that were available during my care of the patient were reviewed by me and considered in my medical decision making (see chart for details).  Patient potassium department left shoulder pain after a fall off the toilet. Appears to be mechanical. Denies any dizziness, weakness, chest pain or shortness of breath. Patient's only complaint is left shoulder pain worse with movement.  Shoulder x-ray negative for fracture. We'll place in an arm sling for comfort. Patient's sodium level did return at 129. We will dose normal saline 1 L, I discussed this with the patient and the need to follow up with her primary care doctor Monday for a repeat sodium check. Patient is agreeable. I discussed strict return precautions for any dizziness, lightheadedness, off-balance sensation, or weakness.  ____________________________________________   FINAL CLINICAL IMPRESSION(S) / ED DIAGNOSES  Left shoulder pain Mild hyponatremia  Harvest Dark, MD 08/24/15 1343

## 2016-04-28 ENCOUNTER — Other Ambulatory Visit: Payer: Self-pay | Admitting: Family Medicine

## 2016-04-28 DIAGNOSIS — R1012 Left upper quadrant pain: Secondary | ICD-10-CM

## 2016-05-06 ENCOUNTER — Other Ambulatory Visit: Payer: Self-pay | Admitting: Nurse Practitioner

## 2016-05-06 DIAGNOSIS — R1310 Dysphagia, unspecified: Secondary | ICD-10-CM

## 2016-05-07 ENCOUNTER — Ambulatory Visit: Payer: Medicare Other

## 2016-05-07 ENCOUNTER — Ambulatory Visit
Admission: RE | Admit: 2016-05-07 | Discharge: 2016-05-07 | Disposition: A | Discharge status: Home / Self Care | Payer: Medicare Other | Source: Ambulatory Visit | Attending: Family Medicine | Admitting: Family Medicine

## 2016-05-07 DIAGNOSIS — N281 Cyst of kidney, acquired: Secondary | ICD-10-CM | POA: Insufficient documentation

## 2016-05-07 DIAGNOSIS — R1012 Left upper quadrant pain: Secondary | ICD-10-CM | POA: Insufficient documentation

## 2016-05-16 ENCOUNTER — Ambulatory Visit
Admission: RE | Admit: 2016-05-16 | Discharge: 2016-05-16 | Disposition: A | Discharge status: Home / Self Care | Payer: Medicare Other | Source: Ambulatory Visit | Attending: Nurse Practitioner | Admitting: Nurse Practitioner

## 2016-05-16 DIAGNOSIS — R195 Other fecal abnormalities: Secondary | ICD-10-CM | POA: Insufficient documentation

## 2016-05-16 DIAGNOSIS — K219 Gastro-esophageal reflux disease without esophagitis: Secondary | ICD-10-CM | POA: Diagnosis not present

## 2016-05-16 DIAGNOSIS — K449 Diaphragmatic hernia without obstruction or gangrene: Secondary | ICD-10-CM | POA: Insufficient documentation

## 2016-05-16 DIAGNOSIS — R1310 Dysphagia, unspecified: Secondary | ICD-10-CM

## 2016-05-16 DIAGNOSIS — R1319 Other dysphagia: Secondary | ICD-10-CM | POA: Insufficient documentation

## 2016-05-16 DIAGNOSIS — K5730 Diverticulosis of large intestine without perforation or abscess without bleeding: Secondary | ICD-10-CM | POA: Insufficient documentation

## 2016-05-16 DIAGNOSIS — K228 Other specified diseases of esophagus: Secondary | ICD-10-CM | POA: Diagnosis not present

## 2016-07-17 ENCOUNTER — Encounter: Payer: Self-pay | Admitting: *Deleted

## 2016-07-21 ENCOUNTER — Encounter: Payer: Self-pay | Admitting: *Deleted

## 2016-07-21 ENCOUNTER — Encounter
Disposition: A | Discharge status: Home / Self Care | Payer: Self-pay | Source: Ambulatory Visit | Attending: Gastroenterology

## 2016-07-21 ENCOUNTER — Ambulatory Visit: Payer: Medicare Other | Admitting: Anesthesiology

## 2016-07-21 ENCOUNTER — Ambulatory Visit
Admit: 2016-07-21 | Discharge: 2016-07-21 | Disposition: A | Discharge status: Home / Self Care | Payer: Medicare Other | Source: Ambulatory Visit | Attending: Gastroenterology | Admitting: Gastroenterology

## 2016-07-21 DIAGNOSIS — K224 Dyskinesia of esophagus: Secondary | ICD-10-CM | POA: Insufficient documentation

## 2016-07-21 DIAGNOSIS — R1310 Dysphagia, unspecified: Secondary | ICD-10-CM | POA: Diagnosis present

## 2016-07-21 DIAGNOSIS — K449 Diaphragmatic hernia without obstruction or gangrene: Secondary | ICD-10-CM | POA: Insufficient documentation

## 2016-07-21 DIAGNOSIS — F17200 Nicotine dependence, unspecified, uncomplicated: Secondary | ICD-10-CM | POA: Diagnosis not present

## 2016-07-21 DIAGNOSIS — I10 Essential (primary) hypertension: Secondary | ICD-10-CM | POA: Insufficient documentation

## 2016-07-21 DIAGNOSIS — Z79899 Other long term (current) drug therapy: Secondary | ICD-10-CM | POA: Diagnosis not present

## 2016-07-21 DIAGNOSIS — K228 Other specified diseases of esophagus: Secondary | ICD-10-CM | POA: Insufficient documentation

## 2016-07-21 DIAGNOSIS — K210 Gastro-esophageal reflux disease with esophagitis: Secondary | ICD-10-CM | POA: Diagnosis not present

## 2016-07-21 DIAGNOSIS — I252 Old myocardial infarction: Secondary | ICD-10-CM | POA: Insufficient documentation

## 2016-07-21 DIAGNOSIS — E785 Hyperlipidemia, unspecified: Secondary | ICD-10-CM | POA: Insufficient documentation

## 2016-07-21 DIAGNOSIS — J449 Chronic obstructive pulmonary disease, unspecified: Secondary | ICD-10-CM | POA: Insufficient documentation

## 2016-07-21 DIAGNOSIS — Z7982 Long term (current) use of aspirin: Secondary | ICD-10-CM | POA: Insufficient documentation

## 2016-07-21 DIAGNOSIS — K2950 Unspecified chronic gastritis without bleeding: Secondary | ICD-10-CM | POA: Insufficient documentation

## 2016-07-21 DIAGNOSIS — F329 Major depressive disorder, single episode, unspecified: Secondary | ICD-10-CM | POA: Insufficient documentation

## 2016-07-21 HISTORY — PX: ESOPHAGOGASTRODUODENOSCOPY (EGD) WITH PROPOFOL: SHX5813

## 2016-07-21 HISTORY — DX: Gastro-esophageal reflux disease without esophagitis: K21.9

## 2016-07-21 HISTORY — DX: Unspecified osteoarthritis, unspecified site: M19.90

## 2016-07-21 HISTORY — DX: Hyperlipidemia, unspecified: E78.5

## 2016-07-21 HISTORY — DX: Age-related osteoporosis without current pathological fracture: M81.0

## 2016-07-21 HISTORY — DX: Diverticulosis of intestine, part unspecified, without perforation or abscess without bleeding: K57.90

## 2016-07-21 HISTORY — DX: Acute myocardial infarction, unspecified: I21.9

## 2016-07-21 HISTORY — DX: Gastrointestinal hemorrhage, unspecified: K92.2

## 2016-07-21 HISTORY — DX: Major depressive disorder, single episode, unspecified: F32.9

## 2016-07-21 HISTORY — DX: Anemia, unspecified: D64.9

## 2016-07-21 HISTORY — DX: Depression, unspecified: F32.A

## 2016-07-21 HISTORY — DX: Dysphagia, unspecified: R13.10

## 2016-07-21 HISTORY — DX: Malignant (primary) neoplasm, unspecified: C80.1

## 2016-07-21 SURGERY — ESOPHAGOGASTRODUODENOSCOPY (EGD) WITH PROPOFOL
Anesthesia: General

## 2016-07-21 MED ORDER — PROPOFOL 10 MG/ML IV BOLUS
INTRAVENOUS | Status: DC | PRN
  Administered 2016-07-21 (×3): 20 mg via INTRAVENOUS
  Administered 2016-07-21: 10 mg via INTRAVENOUS
  Administered 2016-07-21 (×2): 20 mg via INTRAVENOUS
  Administered 2016-07-21: 80 mg via INTRAVENOUS
  Administered 2016-07-21: 20 mg via INTRAVENOUS

## 2016-07-21 MED ORDER — IPRATROPIUM-ALBUTEROL 0.5-2.5 (3) MG/3ML IN SOLN
RESPIRATORY_TRACT | Status: AC
  Administered 2016-07-21: 3 mL via RESPIRATORY_TRACT
  Filled 2016-07-21: qty 3

## 2016-07-21 MED ORDER — IPRATROPIUM-ALBUTEROL 0.5-2.5 (3) MG/3ML IN SOLN
3.0000 mL | Freq: Once | RESPIRATORY_TRACT | Status: AC
  Administered 2016-07-21: 3 mL via RESPIRATORY_TRACT

## 2016-07-21 MED ORDER — SODIUM CHLORIDE 0.9 % IV SOLN
INTRAVENOUS | Status: DC
  Administered 2016-07-21: 10:00:00 via INTRAVENOUS

## 2016-07-21 MED ORDER — SODIUM CHLORIDE 0.9 % IV SOLN: INTRAVENOUS | Status: DC

## 2016-07-21 NOTE — Anesthesia Preprocedure Evaluation (Signed)
Anesthesia Evaluation  Patient identified by MRN, date of birth, ID band Patient awake    Reviewed: Allergy & Precautions, H&P , NPO status , Patient's Chart, lab work & pertinent test results  History of Anesthesia Complications Negative for: history of anesthetic complications  Airway Mallampati: III  TM Distance: <3 FB Neck ROM: limited    Dental  (+) Poor Dentition, Missing, Edentulous Upper, Edentulous Lower   Pulmonary shortness of breath and with exertion, COPD, Current Smoker,    Pulmonary exam normal breath sounds clear to auscultation       Cardiovascular Exercise Tolerance: Poor hypertension, (-) angina(-) Past MI (patient denies MI history) Normal cardiovascular exam Rhythm:regular Rate:Normal     Neuro/Psych PSYCHIATRIC DISORDERS negative neurological ROS     GI/Hepatic Neg liver ROS, GERD  Controlled,  Endo/Other  negative endocrine ROS  Renal/GU negative Renal ROS  negative genitourinary   Musculoskeletal  (+) Arthritis ,   Abdominal   Peds  Hematology negative hematology ROS (+)   Anesthesia Other Findings Past Medical History: No date: Anemia No date: Arthritis No date: Cancer (HCC)     Comment: Skin Cancer No date: COPD (chronic obstructive pulmonary disease) (* No date: Depression No date: Diverticulosis No date: Dysphagia No date: GERD (gastroesophageal reflux disease) No date: GI bleed No date: Hyperlipidemia No date: Hypertension No date: Myocardial infarction     Comment: 2006 No date: Osteoporosis  Past Surgical History: No date: ABDOMINAL HYSTERECTOMY No date: CHOLECYSTECTOMY No date: COLONOSCOPY No date: ESOPHAGOGASTRODUODENOSCOPY  BMI    Body Mass Index:  27.12 kg/m      Reproductive/Obstetrics negative OB ROS                             Anesthesia Physical Anesthesia Plan  ASA: III  Anesthesia Plan: General   Post-op Pain Management:     Induction:   Airway Management Planned:   Additional Equipment:   Intra-op Plan:   Post-operative Plan:   Informed Consent: I have reviewed the patients History and Physical, chart, labs and discussed the procedure including the risks, benefits and alternatives for the proposed anesthesia with the patient or authorized representative who has indicated his/her understanding and acceptance.   Dental Advisory Given  Plan Discussed with: Anesthesiologist, CRNA and Surgeon  Anesthesia Plan Comments:         Anesthesia Quick Evaluation

## 2016-07-21 NOTE — H&P (Addendum)
Outpatient short stay form Pre-procedure 07/21/2016 10:27 AM Kari Sails MD  Primary Physician: Dr. Thereasa Distance  Reason for visit:  Dysphagia  History of present illness:  Patient is a 82 year old female presenting today as above. She has a history of dysphagia in the past as well as more so recently. She had a barium swallow done on 05/16/2016 with findings of presbyesophagus a small sliding hiatal hernia. The standardized barium tablet passed normally there is no obstructing abnormality. On review these films I also note that there is a prominent aortic arch and also pushes on the esophagus. Patient is edentulous and does use dentures. Of note patient does not regurgitate foods.  Of note patient also has a history of anemia and "watermelon stomach".      Current Facility-Administered Medications:  .  0.9 %  sodium chloride infusion, , Intravenous, Continuous, Kari Sails, MD, Last Rate: 20 mL/hr at 07/21/16 1009 .  0.9 %  sodium chloride infusion, , Intravenous, Continuous, Kari Sails, MD .  0.9 %  sodium chloride infusion, , Intravenous, Continuous, Kari Sails, MD  Prescriptions Prior to Admission  Medication Sig Dispense Refill Last Dose  . albuterol (PROVENTIL HFA;VENTOLIN HFA) 108 (90 Base) MCG/ACT inhaler Inhale 2 puffs into the lungs every 6 (six) hours as needed for wheezing or shortness of breath.   07/20/2016 at Unknown time  . aspirin EC 81 MG tablet Take 81 mg by mouth daily.   07/20/2016 at Unknown time  . atorvastatin (LIPITOR) 20 MG tablet Take 20 mg by mouth daily.   07/20/2016 at Unknown time  . citalopram (CELEXA) 40 MG tablet Take 40 mg by mouth daily.   07/20/2016 at Unknown time  . dexlansoprazole (DEXILANT) 60 MG capsule Take 60 mg by mouth daily.   07/20/2016 at Unknown time  . tiotropium (SPIRIVA) 18 MCG inhalation capsule Place 18 mcg into inhaler and inhale daily.   07/20/2016 at Unknown time  . traZODone (DESYREL) 100 MG tablet  Take 200 mg by mouth at bedtime.   07/20/2016 at Unknown time  . triamterene-hydrochlorothiazide (MAXZIDE) 75-50 MG tablet Take 1 tablet by mouth daily.   07/20/2016 at Unknown time  . azithromycin (ZITHROMAX Z-PAK) 250 MG tablet Take 2 tablets first day and then 1 po a day for 4 days 6 tablet 0   . benzonatate (TESSALON) 200 MG capsule Take 1 capsule (200 mg total) by mouth 3 (three) times daily as needed for cough. 20 capsule 1   . HYDROcodone-acetaminophen (NORCO/VICODIN) 5-325 MG tablet Take 1 tablet by mouth every 4 (four) hours as needed for moderate pain. 12 tablet 0   . predniSONE (STERAPRED UNI-PAK 21 TAB) 10 MG (21) TBPK tablet Sig 6 tablet day 1, 5 tablets day 2, 4 tablets day 3,,3tablets day 4, 2 tablets day 5, 1 tablet day 6 take all tablets orally 21 tablet 0   . sucralfate (CARAFATE) 1 G tablet Take 2 tablets (2 g total) by mouth 2 (two) times daily. An hour before eating. 120 tablet 0      Allergies  Allergen Reactions  . Penicillins Rash     Past Medical History:  Diagnosis Date  . Anemia   . Arthritis   . Cancer ([place])    Skin Cancer  . COPD (chronic obstructive pulmonary disease) ([place])   . Depression   . Diverticulosis   . Dysphagia   . GERD (gastroesophageal reflux disease)   . GI bleed   . Hyperlipidemia   .  Hypertension   . Myocardial infarction    2006  . Osteoporosis     Review of systems:      Physical Exam    Heart and lungs: Regular rate and rhythm without rub or gallop, lungs are bilaterally clear.    HEENT: Normocephalic atraumatic eyes are anicteric    Other:     Pertinant exam for procedure: Soft mild discomfort to palpation left lower quadrant suprapubic area. There are no masses or rebound. Bowel sounds are positive normoactive.    Planned proceedures: EGD and indicated procedures. I have discussed the risks benefits and complications of procedures to include not limited to bleeding, infection, perforation and the risk of sedation and  the patient wishes to proceed.    Kari Sails, MD Gastroenterology 07/21/2016  10:27 AM

## 2016-07-21 NOTE — Anesthesia Postprocedure Evaluation (Signed)
Anesthesia Post Note  Patient: Kari Mcdonald  Procedure(s) Performed: Procedure(s) (LRB): ESOPHAGOGASTRODUODENOSCOPY (EGD) WITH PROPOFOL (N/A)  Patient location during evaluation: Endoscopy Anesthesia Type: General Level of consciousness: awake and alert Pain management: pain level controlled Vital Signs Assessment: post-procedure vital signs reviewed and stable Respiratory status: spontaneous breathing, nonlabored ventilation, respiratory function stable and patient connected to nasal cannula oxygen Cardiovascular status: blood pressure returned to baseline and stable Postop Assessment: no signs of nausea or vomiting Anesthetic complications: no    Last Vitals:  Vitals:   07/21/16 1050 07/21/16 1100  BP:  (!) 150/64  Pulse:    Resp:    Temp: 36.4 C     Last Pain:  Vitals:   07/21/16 0936  TempSrc: Tympanic                 Precious Haws Akila Batta

## 2016-07-21 NOTE — Transfer of Care (Addendum)
Immediate Anesthesia Transfer of Care Note  Patient: Kari Mcdonald  Procedure(s) Performed: Procedure(s): ESOPHAGOGASTRODUODENOSCOPY (EGD) WITH PROPOFOL (N/A)  Patient Location: PACU  Anesthesia Type:General  Level of Consciousness: sedated and patient cooperative  Airway & Oxygen Therapy: Patient Spontanous Breathing and Patient connected to nasal cannula oxygen  Post-op Assessment: Report given to RN and Post -op Vital signs reviewed and stable  Post vital signs: Reviewed and stable  Last Vitals:  Vitals:   07/21/16 0936 07/21/16 1050  BP: (!) 186/54   Pulse: 63   Resp: 18   Temp: 36.5 C 36.4 C    Last Pain:  Vitals:   07/21/16 0936  TempSrc: Tympanic         Complications: No apparent anesthesia complications

## 2016-07-21 NOTE — Op Note (Addendum)
[facility] Gastroenterology Patient Name: Kari Mcdonald Procedure Date: 07/21/2016 10:17 AM MRN: [id number] Account #: [id number] Date of Birth: 06/13/1932 Admit Type: Outpatient Age: 82 Room: [facility] ENDO ROOM 3 Gender: Female Note Status: Finalized Procedure:            Upper GI endoscopy Indications:          Dysphagia Providers:            Lollie Sails, MD Referring MD:         Sofie Hartigan (Referring MD) Medicines:            Monitored Anesthesia Care Complications:        No immediate complications. Procedure:            Pre-Anesthesia Assessment:                       - ASA Grade Assessment: III - A patient with severe                        systemic disease.                       After obtaining informed consent, the endoscope was                        passed under direct vision. Throughout the procedure,                        the patient's blood pressure, pulse, and oxygen                        saturations were monitored continuously. The Endoscope                        was introduced through the mouth, and advanced to the                        third part of duodenum. The upper GI endoscopy was                        accomplished without difficulty. Findings:      LA Grade B (one or more mucosal breaks greater than 5 mm, not extending       between the tops of two mucosal folds) esophagitis with no bleeding was       found 25 to 35 cm from the incisors. Biopsies were taken with a cold       forceps for histology at 28 cm from the incisors.      Abnormal motility was noted in the middle third of the esophagus and in       the lower third of the esophagus. The cricopharyngeus was normal. There       are extra peristaltic waves in the esophageal body. Tertiary peristaltic       waves are noted.      The Z-line was variable. Biopsies were taken with a cold forceps for       histology.      Diffuse and patchy mild inflammation  characterized by congestion (edema)       and erythema was found in the gastric body and in the gastric antrum.       Biopsies were taken  with a cold forceps for histology. Biopsies were       taken with a cold forceps for Helicobacter pylori testing.      A small to medium hiatal hernia was found. The Z-line was a variable       distance from incisors; the hiatal hernia was sliding. Impression:           - LA Grade B erosive esophagitis. Biopsied.                       - Abnormal esophageal motility, consistent with                        presbyesophagus.                       - Z-line variable. Biopsied.                       - Gastritis. Biopsied.                       - Small hiatal hernia. Recommendation:       - Discharge patient to home.                       - Await pathology results.                       - Use Protonix (pantoprazole) 40 mg PO BID daily.                       - Return to GI clinic in 4 weeks. Procedure Code(s):    --- Professional ---                       [phone], Esophagogastroduodenoscopy, flexible, transoral;                        with biopsy, single or multiple CPT copyright 2016 American Medical Association. All rights reserved. The codes documented in this report are preliminary and upon coder review may  be revised to meet current compliance requirements. Lollie Sails, MD 07/21/2016 10:53:07 AM This report has been signed electronically. Number of Addenda: 0 Note Initiated On: 07/21/2016 10:17 AM      [facility]

## 2016-07-22 LAB — SURGICAL PATHOLOGY

## 2016-07-26 ENCOUNTER — Encounter: Payer: Self-pay | Admitting: Gastroenterology

## 2016-07-30 NOTE — Discharge Instructions (Signed)

## 2016-07-31 ENCOUNTER — Encounter: Payer: Self-pay | Admitting: *Deleted

## 2016-08-06 ENCOUNTER — Encounter
Admission: RE | Disposition: A | Discharge status: Home / Self Care | Payer: Self-pay | Source: Ambulatory Visit | Attending: Ophthalmology

## 2016-08-06 ENCOUNTER — Ambulatory Visit: Payer: Medicare Other | Admitting: Anesthesiology

## 2016-08-06 ENCOUNTER — Ambulatory Visit
Admission: RE | Admit: 2016-08-06 | Discharge: 2016-08-06 | Disposition: A | Discharge status: Home / Self Care | Payer: Medicare Other | Source: Ambulatory Visit | Attending: Ophthalmology | Admitting: Ophthalmology

## 2016-08-06 DIAGNOSIS — I252 Old myocardial infarction: Secondary | ICD-10-CM | POA: Insufficient documentation

## 2016-08-06 DIAGNOSIS — J449 Chronic obstructive pulmonary disease, unspecified: Secondary | ICD-10-CM | POA: Insufficient documentation

## 2016-08-06 DIAGNOSIS — K219 Gastro-esophageal reflux disease without esophagitis: Secondary | ICD-10-CM | POA: Insufficient documentation

## 2016-08-06 DIAGNOSIS — I10 Essential (primary) hypertension: Secondary | ICD-10-CM | POA: Diagnosis not present

## 2016-08-06 DIAGNOSIS — H2512 Age-related nuclear cataract, left eye: Secondary | ICD-10-CM | POA: Insufficient documentation

## 2016-08-06 DIAGNOSIS — F17200 Nicotine dependence, unspecified, uncomplicated: Secondary | ICD-10-CM | POA: Diagnosis not present

## 2016-08-06 HISTORY — DX: Presence of dental prosthetic device (complete) (partial): Z97.2

## 2016-08-06 HISTORY — DX: Headache, unspecified: R51.9

## 2016-08-06 HISTORY — DX: Headache: R51

## 2016-08-06 HISTORY — PX: CATARACT EXTRACTION W/PHACO: SHX586

## 2016-08-06 SURGERY — PHACOEMULSIFICATION, CATARACT, WITH IOL INSERTION
Anesthesia: Monitor Anesthesia Care | Site: Eye | Laterality: Left | Wound class: Clean

## 2016-08-06 MED ORDER — ACETAMINOPHEN 325 MG PO TABS: 325.0000 mg | ORAL_TABLET | ORAL | Status: DC | PRN

## 2016-08-06 MED ORDER — POVIDONE-IODINE 5 % OP SOLN
OPHTHALMIC | Status: DC | PRN
  Administered 2016-08-06: 1 via OPHTHALMIC

## 2016-08-06 MED ORDER — ACETAMINOPHEN 160 MG/5ML PO SOLN: 325.0000 mg | ORAL | Status: DC | PRN

## 2016-08-06 MED ORDER — MIDAZOLAM HCL 2 MG/2ML IJ SOLN
INTRAMUSCULAR | Status: DC | PRN
  Administered 2016-08-06: 1 mg via INTRAVENOUS

## 2016-08-06 MED ORDER — BALANCED SALT IO SOLN
INTRAOCULAR | Status: DC | PRN
  Administered 2016-08-06: 1 mL via OPHTHALMIC

## 2016-08-06 MED ORDER — NA HYALUR & NA CHOND-NA HYALUR 0.4-0.35 ML IO KIT
PACK | INTRAOCULAR | Status: DC | PRN
  Administered 2016-08-06: 1 mL via INTRAOCULAR

## 2016-08-06 MED ORDER — MOXIFLOXACIN HCL 0.5 % OP SOLN
1.0000 [drp] | OPHTHALMIC | Status: DC | PRN
  Administered 2016-08-06 (×3): 1 [drp] via OPHTHALMIC

## 2016-08-06 MED ORDER — TIMOLOL MALEATE 0.5 % OP SOLN
OPHTHALMIC | Status: DC | PRN
  Administered 2016-08-06: 1 [drp] via OPHTHALMIC

## 2016-08-06 MED ORDER — MOXIFLOXACIN HCL 0.5 % OP SOLN
OPHTHALMIC | Status: DC | PRN
  Administered 2016-08-06: .2 mL via OPHTHALMIC

## 2016-08-06 MED ORDER — BRIMONIDINE TARTRATE 0.2 % OP SOLN
OPHTHALMIC | Status: DC | PRN
  Administered 2016-08-06: 1 [drp] via OPHTHALMIC

## 2016-08-06 MED ORDER — FENTANYL CITRATE (PF) 100 MCG/2ML IJ SOLN
INTRAMUSCULAR | Status: DC | PRN
  Administered 2016-08-06: 50 ug via INTRAVENOUS

## 2016-08-06 MED ORDER — ARMC OPHTHALMIC DILATING DROPS
1.0000 "application " | OPHTHALMIC | Status: DC | PRN
  Administered 2016-08-06 (×3): 1 via OPHTHALMIC

## 2016-08-06 MED ORDER — EPINEPHRINE PF 1 MG/ML IJ SOLN
INTRAOCULAR | Status: DC | PRN
  Administered 2016-08-06: 68 mL via OPHTHALMIC

## 2016-08-06 MED ORDER — LACTATED RINGERS IV SOLN: INTRAVENOUS | Status: DC

## 2016-08-06 SURGICAL SUPPLY — 25 items
CANNULA ANT/CHMB 27GA (MISCELLANEOUS) ×3 IMPLANT
CARTRIDGE ABBOTT (MISCELLANEOUS) IMPLANT
GLOVE SURG LX 7.5 STRW (GLOVE) ×2
GLOVE SURG LX STRL 7.5 STRW (GLOVE) ×1 IMPLANT
GLOVE SURG TRIUMPH 8.0 PF LTX (GLOVE) ×3 IMPLANT
GOWN STRL REUS W/ TWL LRG LVL3 (GOWN DISPOSABLE) ×2 IMPLANT
GOWN STRL REUS W/TWL LRG LVL3 (GOWN DISPOSABLE) ×4
LENS IOL TECNIS ITEC 24.5 (Intraocular Lens) ×3 IMPLANT
MARKER SKIN DUAL TIP RULER LAB (MISCELLANEOUS) ×3 IMPLANT
NDL RETROBULBAR .5 NSTRL (NEEDLE) IMPLANT
NEEDLE FILTER BLUNT 18X 1/2SAF (NEEDLE) ×4
NEEDLE FILTER BLUNT 18X1 1/2 (NEEDLE) ×2 IMPLANT
PACK CATARACT BRASINGTON (MISCELLANEOUS) ×3 IMPLANT
PACK EYE AFTER SURG (MISCELLANEOUS) ×3 IMPLANT
PACK OPTHALMIC (MISCELLANEOUS) ×3 IMPLANT
RING MALYGIN 7.0 (MISCELLANEOUS) IMPLANT
SUT ETHILON 10-0 CS-B-6CS-B-6 (SUTURE)
SUT VICRYL  9 0 (SUTURE)
SUT VICRYL 9 0 (SUTURE) IMPLANT
SUTURE EHLN 10-0 CS-B-6CS-B-6 (SUTURE) IMPLANT
SYR 3ML LL SCALE MARK (SYRINGE) ×6 IMPLANT
SYR 5ML LL (SYRINGE) ×3 IMPLANT
SYR TB 1ML LUER SLIP (SYRINGE) ×3 IMPLANT
WATER STERILE IRR 250ML POUR (IV SOLUTION) ×3 IMPLANT
WIPE NON LINTING 3.25X3.25 (MISCELLANEOUS) ×3 IMPLANT

## 2016-08-06 NOTE — Anesthesia Procedure Notes (Signed)
Procedure Name: MAC Date/Time: 08/06/2016 9:17 AM Performed by: Cameron Ali Pre-anesthesia Checklist: Patient identified, Emergency Drugs available, Suction available, Timeout performed and Patient being monitored Patient Re-evaluated:Patient Re-evaluated prior to inductionOxygen Delivery Method: Nasal cannula Placement Confirmation: positive ETCO2

## 2016-08-06 NOTE — H&P (Signed)
The History and Physical notes are on paper, have been signed, and are to be scanned. The patient remains stable and unchanged from the H&P.   Previous H&P reviewed, patient examined, and there are no changes.  Kari Mcdonald 08/06/2016 8:45 AM

## 2016-08-06 NOTE — Op Note (Signed)
OPERATIVE NOTE  LUWANDA PRIDE [id number] 08/06/2016   PREOPERATIVE DIAGNOSIS:  Nuclear sclerotic cataract left eye. H25.12   POSTOPERATIVE DIAGNOSIS:    Nuclear sclerotic cataract left eye.     PROCEDURE:  Phacoemusification with posterior chamber intraocular lens placement of the left eye   LENS:   Implant Name Type Inv. Item Serial No. Manufacturer Lot No. LRB No. Used  LENS IOL DIOP 24.5 - [id number] Intraocular Lens LENS IOL DIOP 24.5 [id number]   Left 1        ULTRASOUND TIME: 17  % of 1 minutes 16 seconds, CDE 13.0  SURGEON:  Wyonia Hough, MD   ANESTHESIA:  Topical with tetracaine drops and 2% Xylocaine jelly, augmented with 1% preservative-free intracameral lidocaine.    COMPLICATIONS:  None.   DESCRIPTION OF PROCEDURE:  The patient was identified in the holding room and transported to the operating room and placed in the supine position under the operating microscope.  The left eye was identified as the operative eye and it was prepped and draped in the usual sterile ophthalmic fashion.   A 1 millimeter clear-corneal paracentesis was made at the 1:30 position.  0.5 ml of preservative-free 1% lidocaine was injected into the anterior chamber.  The anterior chamber was filled with Viscoat viscoelastic.  A 2.4 millimeter keratome was used to make a near-clear corneal incision at the 10:30 position.  .  A curvilinear capsulorrhexis was made with a cystotome and capsulorrhexis forceps.  Balanced salt solution was used to hydrodissect and hydrodelineate the nucleus.   Phacoemulsification was then used in stop and chop fashion to remove the lens nucleus and epinucleus.  The remaining cortex was then removed using the irrigation and aspiration handpiece. Provisc was then placed into the capsular bag to distend it for lens placement.  A lens was then injected into the capsular bag.  The remaining viscoelastic was aspirated.   Wounds were hydrated with balanced salt  solution.  The anterior chamber was inflated to a physiologic pressure with balanced salt solution.  No wound leaks were noted. Vigamox 0.2 ml of a 1mg  per ml solution was injected into the anterior chamber for a dose of 0.2 mg of intracameral antibiotic at the completion of the case.   Timolol and Brimonidine drops were applied to the eye.  The patient was taken to the recovery room in stable condition without complications of anesthesia or surgery.  Donye Campanelli 08/06/2016, 9:34 AM

## 2016-08-06 NOTE — Anesthesia Postprocedure Evaluation (Signed)
Anesthesia Post Note  Patient: Kari Mcdonald  Procedure(s) Performed: Procedure(s) (LRB): CATARACT EXTRACTION PHACO AND INTRAOCULAR LENS PLACEMENT (IOC) (Left)  Patient location during evaluation: PACU Anesthesia Type: MAC Level of consciousness: awake and alert and oriented Pain management: satisfactory to patient Vital Signs Assessment: post-procedure vital signs reviewed and stable Respiratory status: spontaneous breathing, nonlabored ventilation and respiratory function stable Cardiovascular status: blood pressure returned to baseline and stable Postop Assessment: Adequate PO intake and No signs of nausea or vomiting Anesthetic complications: no    Raliegh Ip

## 2016-08-06 NOTE — Anesthesia Preprocedure Evaluation (Signed)
Anesthesia Evaluation  Patient identified by MRN, date of birth, ID band Patient awake    Reviewed: Allergy & Precautions, H&P , NPO status , Patient's Chart, lab work & pertinent test results  Airway Mallampati: II  TM Distance: >3 FB Neck ROM: full    Dental  (+) Edentulous Upper, Edentulous Lower   Pulmonary COPD,  COPD inhaler, Current Smoker,    + rhonchi  + wheezing      Cardiovascular hypertension, + Past MI  Normal cardiovascular exam     Neuro/Psych PSYCHIATRIC DISORDERS    GI/Hepatic GERD  ,  Endo/Other    Renal/GU      Musculoskeletal   Abdominal   Peds  Hematology   Anesthesia Other Findings   Reproductive/Obstetrics                             Anesthesia Physical Anesthesia Plan  ASA: III  Anesthesia Plan: MAC   Post-op Pain Management:    Induction:   Airway Management Planned:   Additional Equipment:   Intra-op Plan:   Post-operative Plan:   Informed Consent: I have reviewed the patients History and Physical, chart, labs and discussed the procedure including the risks, benefits and alternatives for the proposed anesthesia with the patient or authorized representative who has indicated his/her understanding and acceptance.     Plan Discussed with:   Anesthesia Plan Comments:         Anesthesia Quick Evaluation

## 2016-08-06 NOTE — Transfer of Care (Signed)
Immediate Anesthesia Transfer of Care Note  Patient: Kari Mcdonald  Procedure(s) Performed: Procedure(s): CATARACT EXTRACTION PHACO AND INTRAOCULAR LENS PLACEMENT (IOC) (Left)  Patient Location: PACU  Anesthesia Type: MAC  Level of Consciousness: awake, alert  and patient cooperative  Airway and Oxygen Therapy: Patient Spontanous Breathing and Patient connected to supplemental oxygen  Post-op Assessment: Post-op Vital signs reviewed, Patient's Cardiovascular Status Stable, Respiratory Function Stable, Patent Airway and No signs of Nausea or vomiting  Post-op Vital Signs: Reviewed and stable  Complications: No apparent anesthesia complications

## 2016-08-07 ENCOUNTER — Encounter: Payer: Self-pay | Admitting: Ophthalmology

## 2016-09-04 NOTE — Discharge Instructions (Signed)
Cataract Surgery, Care After °Refer to this sheet in the next few weeks. These instructions provide you with information about caring for yourself after your procedure. Your health care provider may also give you more specific instructions. Your treatment has been planned according to current medical practices, but problems sometimes occur. Call your health care provider if you have any problems or questions after your procedure. °What can I expect after the procedure? °After the procedure, it is common to have: °· Itching. °· Discomfort. °· Fluid discharge. °· Sensitivity to light and to touch. °· Bruising. °Follow these instructions at home: °Eye Care  °· Check your eye every day for signs of infection. Watch for: °¨ Redness, swelling, or pain. °¨ Fluid, blood, or pus. °¨ Warmth. °¨ Bad smell. °Activity  °· Avoid strenuous activities, such as playing contact sports, for as long as told by your health care provider. °· Do not drive or operate heavy machinery until your health care provider approves. °· Do not bend or lift heavy objects . Bending increases pressure in the eye. You can walk, climb stairs, and do light household chores. °· Ask your health care provider when you can return to work. If you work in a dusty environment, you may be advised to wear protective eyewear for a period of time. °General instructions  °· Take or apply over-the-counter and prescription medicines only as told by your health care provider. This includes eye drops. °· Do not touch or rub your eyes. °· If you were given a protective shield, wear it as told by your health care provider. If you were not given a protective shield, wear sunglasses as told by your health care provider to protect your eyes. °· Keep the area around your eye clean and dry. Avoid swimming or allowing water to hit you directly in the face while showering until told by your health care provider. Keep soap and shampoo out of your eyes. °· Do not put a contact lens  into the affected eye or eyes until your health care provider approves. °· Keep all follow-up visits as told by your health care provider. This is important. °Contact a health care provider if: ° °· You have increased bruising around your eye. °· You have pain that is not helped with medicine. °· You have a fever. °· You have redness, swelling, or pain in your eye. °· You have fluid, blood, or pus coming from your incision. °· Your vision gets worse. °Get help right away if: °· You have sudden vision loss. °This information is not intended to replace advice given to you by your health care provider. Make sure you discuss any questions you have with your health care provider. °Document Released: 04/11/2005 Document Revised: 01/31/2016 Document Reviewed: 08/02/2015 °Elsevier Interactive Patient Education © 2017 [employer] ° ° ° ° °General Anesthesia, Adult, Care After °These instructions provide you with information about caring for yourself after your procedure. Your health care provider may also give you more specific instructions. Your treatment has been planned according to current medical practices, but problems sometimes occur. Call your health care provider if you have any problems or questions after your procedure. °What can I expect after the procedure? °After the procedure, it is common to have: °· Vomiting. °· A sore throat. °· Mental slowness. °It is common to feel: °· Nauseous. °· Cold or shivery. °· Sleepy. °· Tired. °· Sore or achy, even in parts of your body where you did not have surgery. °Follow these instructions at   home: °For at least 24 hours after the procedure:  °· Do not: °¨ Participate in activities where you could fall or become injured. °¨ Drive. °¨ Use heavy machinery. °¨ Drink alcohol. °¨ Take sleeping pills or medicines that cause drowsiness. °¨ Make important decisions or sign legal documents. °¨ Take care of children on your own. °· Rest. °Eating and drinking  °· If you vomit, drink  water, juice, or soup when you can drink without vomiting. °· Drink enough fluid to keep your urine clear or pale yellow. °· Make sure you have little or no nausea before eating solid foods. °· Follow the diet recommended by your health care provider. °General instructions  °· Have a responsible adult stay with you until you are awake and alert. °· Return to your normal activities as told by your health care provider. Ask your health care provider what activities are safe for you. °· Take over-the-counter and prescription medicines only as told by your health care provider. °· If you smoke, do not smoke without supervision. °· Keep all follow-up visits as told by your health care provider. This is important. °Contact a health care provider if: °· You continue to have nausea or vomiting at home, and medicines are not helpful. °· You cannot drink fluids or start eating again. °· You cannot urinate after 8-12 hours. °· You develop a skin rash. °· You have fever. °· You have increasing redness at the site of your procedure. °Get help right away if: °· You have difficulty breathing. °· You have chest pain. °· You have unexpected bleeding. °· You feel that you are having a life-threatening or urgent problem. °This information is not intended to replace advice given to you by your health care provider. Make sure you discuss any questions you have with your health care provider. °Document Released: 12/29/2000 Document Revised: 02/25/2016 Document Reviewed: 09/06/2015 °Elsevier Interactive Patient Education © 2017 Elsevier Inc. ° °

## 2016-09-10 ENCOUNTER — Ambulatory Visit: Payer: Medicare Other | Admitting: Anesthesiology

## 2016-09-10 ENCOUNTER — Encounter
Admission: RE | Disposition: A | Discharge status: Home / Self Care | Payer: Self-pay | Source: Ambulatory Visit | Attending: Ophthalmology

## 2016-09-10 ENCOUNTER — Ambulatory Visit
Admission: RE | Admit: 2016-09-10 | Discharge: 2016-09-10 | Disposition: A | Discharge status: Home / Self Care | Payer: Medicare Other | Source: Ambulatory Visit | Attending: Ophthalmology | Admitting: Ophthalmology

## 2016-09-10 DIAGNOSIS — H2511 Age-related nuclear cataract, right eye: Secondary | ICD-10-CM | POA: Insufficient documentation

## 2016-09-10 DIAGNOSIS — K219 Gastro-esophageal reflux disease without esophagitis: Secondary | ICD-10-CM | POA: Insufficient documentation

## 2016-09-10 DIAGNOSIS — F17200 Nicotine dependence, unspecified, uncomplicated: Secondary | ICD-10-CM | POA: Diagnosis not present

## 2016-09-10 DIAGNOSIS — I10 Essential (primary) hypertension: Secondary | ICD-10-CM | POA: Diagnosis not present

## 2016-09-10 DIAGNOSIS — I252 Old myocardial infarction: Secondary | ICD-10-CM | POA: Diagnosis not present

## 2016-09-10 DIAGNOSIS — J449 Chronic obstructive pulmonary disease, unspecified: Secondary | ICD-10-CM | POA: Diagnosis not present

## 2016-09-10 HISTORY — DX: Personal history of other diseases of the digestive system: Z87.19

## 2016-09-10 HISTORY — DX: Unspecified hearing loss, unspecified ear: H91.90

## 2016-09-10 HISTORY — DX: Dyspnea, unspecified: R06.00

## 2016-09-10 HISTORY — PX: CATARACT EXTRACTION W/PHACO: SHX586

## 2016-09-10 HISTORY — DX: Atherosclerotic heart disease of native coronary artery without angina pectoris: I25.10

## 2016-09-10 SURGERY — PHACOEMULSIFICATION, CATARACT, WITH IOL INSERTION
Anesthesia: Monitor Anesthesia Care | Laterality: Right | Wound class: Clean

## 2016-09-10 MED ORDER — TIMOLOL MALEATE 0.5 % OP SOLN
OPHTHALMIC | Status: DC | PRN
  Administered 2016-09-10: 1 [drp] via OPHTHALMIC

## 2016-09-10 MED ORDER — BRIMONIDINE TARTRATE 0.2 % OP SOLN
OPHTHALMIC | Status: DC | PRN
  Administered 2016-09-10: 1 [drp] via OPHTHALMIC

## 2016-09-10 MED ORDER — ARMC OPHTHALMIC DILATING DROPS
1.0000 "application " | OPHTHALMIC | Status: DC | PRN
  Administered 2016-09-10 (×3): 1 via OPHTHALMIC

## 2016-09-10 MED ORDER — MIDAZOLAM HCL 2 MG/2ML IJ SOLN
INTRAMUSCULAR | Status: DC | PRN
  Administered 2016-09-10: 2 mg via INTRAVENOUS

## 2016-09-10 MED ORDER — ACETAMINOPHEN 160 MG/5ML PO SOLN: 325.0000 mg | ORAL | Status: DC | PRN

## 2016-09-10 MED ORDER — MOXIFLOXACIN HCL 0.5 % OP SOLN
OPHTHALMIC | Status: DC | PRN
  Administered 2016-09-10: 0.4 mL via OPHTHALMIC

## 2016-09-10 MED ORDER — FENTANYL CITRATE (PF) 100 MCG/2ML IJ SOLN
INTRAMUSCULAR | Status: DC | PRN
  Administered 2016-09-10: 50 ug via INTRAVENOUS

## 2016-09-10 MED ORDER — ACETAMINOPHEN 325 MG PO TABS: 325.0000 mg | ORAL_TABLET | ORAL | Status: DC | PRN

## 2016-09-10 MED ORDER — NA HYALUR & NA CHOND-NA HYALUR 0.4-0.35 ML IO KIT
PACK | INTRAOCULAR | Status: DC | PRN
  Administered 2016-09-10: 1 mL via INTRAOCULAR

## 2016-09-10 MED ORDER — MOXIFLOXACIN HCL 0.5 % OP SOLN
1.0000 [drp] | OPHTHALMIC | Status: DC | PRN
  Administered 2016-09-10 (×3): 1 [drp] via OPHTHALMIC

## 2016-09-10 MED ORDER — LIDOCAINE HCL (PF) 4 % IJ SOLN
INTRAMUSCULAR | Status: DC | PRN
  Administered 2016-09-10: 1 mL via OPHTHALMIC

## 2016-09-10 MED ORDER — LACTATED RINGERS IV SOLN: INTRAVENOUS | Status: DC

## 2016-09-10 MED ORDER — EPINEPHRINE PF 1 MG/ML IJ SOLN
INTRAOCULAR | Status: DC | PRN
  Administered 2016-09-10: 50 mL via OPHTHALMIC

## 2016-09-10 SURGICAL SUPPLY — 25 items
CANNULA ANT/CHMB 27GA (MISCELLANEOUS) ×3 IMPLANT
CARTRIDGE ABBOTT (MISCELLANEOUS) IMPLANT
GLOVE SURG LX 7.5 STRW (GLOVE) ×2
GLOVE SURG LX STRL 7.5 STRW (GLOVE) ×1 IMPLANT
GLOVE SURG TRIUMPH 8.0 PF LTX (GLOVE) ×3 IMPLANT
GOWN STRL REUS W/ TWL LRG LVL3 (GOWN DISPOSABLE) ×2 IMPLANT
GOWN STRL REUS W/TWL LRG LVL3 (GOWN DISPOSABLE) ×4
LENS IOL TECNIS ITEC 25.5 (Intraocular Lens) ×3 IMPLANT
MARKER SKIN DUAL TIP RULER LAB (MISCELLANEOUS) ×3 IMPLANT
NDL RETROBULBAR .5 NSTRL (NEEDLE) IMPLANT
NEEDLE FILTER BLUNT 18X 1/2SAF (NEEDLE) ×2
NEEDLE FILTER BLUNT 18X1 1/2 (NEEDLE) ×1 IMPLANT
PACK CATARACT BRASINGTON (MISCELLANEOUS) ×3 IMPLANT
PACK EYE AFTER SURG (MISCELLANEOUS) ×3 IMPLANT
PACK OPTHALMIC (MISCELLANEOUS) ×3 IMPLANT
RING MALYGIN 7.0 (MISCELLANEOUS) IMPLANT
SUT ETHILON 10-0 CS-B-6CS-B-6 (SUTURE)
SUT VICRYL  9 0 (SUTURE)
SUT VICRYL 9 0 (SUTURE) IMPLANT
SUTURE EHLN 10-0 CS-B-6CS-B-6 (SUTURE) IMPLANT
SYR 3ML LL SCALE MARK (SYRINGE) ×3 IMPLANT
SYR 5ML LL (SYRINGE) ×3 IMPLANT
SYR TB 1ML LUER SLIP (SYRINGE) ×3 IMPLANT
WATER STERILE IRR 250ML POUR (IV SOLUTION) ×3 IMPLANT
WIPE NON LINTING 3.25X3.25 (MISCELLANEOUS) ×3 IMPLANT

## 2016-09-10 NOTE — Anesthesia Procedure Notes (Signed)
Procedure Name: MAC Performed by: Shakendra Griffeth Pre-anesthesia Checklist: Patient identified, Emergency Drugs available, Suction available, Timeout performed and Patient being monitored Patient Re-evaluated:Patient Re-evaluated prior to inductionOxygen Delivery Method: Nasal cannula Placement Confirmation: positive ETCO2       

## 2016-09-10 NOTE — H&P (Signed)
The History and Physical notes are on paper, have been signed, and are to be scanned. The patient remains stable and unchanged from the H&P.   Previous H&P reviewed, patient examined, and there are no changes.  Kari Mcdonald 09/10/2016 9:20 AM

## 2016-09-10 NOTE — Anesthesia Preprocedure Evaluation (Addendum)
Anesthesia Evaluation  Patient identified by MRN, date of birth, ID band Patient awake    Reviewed: Allergy & Precautions, H&P , NPO status , Patient's Chart, lab work & pertinent test results  Airway Mallampati: II  TM Distance: >3 FB Neck ROM: full    Dental no notable dental hx. (+) Edentulous Upper, Edentulous Lower   Pulmonary shortness of breath, COPD,  COPD inhaler, Current Smoker,    Pulmonary exam normal + rhonchi  + wheezing      Cardiovascular hypertension, + Past MI  Normal cardiovascular exam     Neuro/Psych PSYCHIATRIC DISORDERS    GI/Hepatic GERD  ,  Endo/Other    Renal/GU      Musculoskeletal   Abdominal   Peds  Hematology   Anesthesia Other Findings   Reproductive/Obstetrics                             Anesthesia Physical  Anesthesia Plan  ASA: II  Anesthesia Plan: MAC   Post-op Pain Management:    Induction:   Airway Management Planned:   Additional Equipment:   Intra-op Plan:   Post-operative Plan:   Informed Consent: I have reviewed the patients History and Physical, chart, labs and discussed the procedure including the risks, benefits and alternatives for the proposed anesthesia with the patient or authorized representative who has indicated his/her understanding and acceptance.     Plan Discussed with:   Anesthesia Plan Comments: (Course rhonchi.  Pt states she feels at baseline health.  Encourged pt to visit PCP for check-up. )       Anesthesia Quick Evaluation

## 2016-09-10 NOTE — Op Note (Signed)
LOCATION:  [facility]   PREOPERATIVE DIAGNOSIS:    Nuclear sclerotic cataract right eye. H25.11   POSTOPERATIVE DIAGNOSIS:  Nuclear sclerotic cataract right eye.     PROCEDURE:  Phacoemusification with posterior chamber intraocular lens placement of the right eye   LENS:   Implant Name Type Inv. Item Serial No. Manufacturer Lot No. LRB No. Used  LENS IOL DIOP 25.5 - [id number] Intraocular Lens LENS IOL DIOP 25.5 [id number]   Right 1        ULTRASOUND TIME: 18 % of 1 minutes, 3 seconds.  CDE 11.2   SURGEON:  Wyonia Hough, MD   ANESTHESIA:  Topical with tetracaine drops and 2% Xylocaine jelly, augmented with 1% preservative-free intracameral lidocaine.    COMPLICATIONS:  None.   DESCRIPTION OF PROCEDURE:  The patient was identified in the holding room and transported to the operating room and placed in the supine position under the operating microscope.  The right eye was identified as the operative eye and it was prepped and draped in the usual sterile ophthalmic fashion.   A 1 millimeter clear-corneal paracentesis was made at the 12:00 position.  0.5 ml of preservative-free 1% lidocaine was injected into the anterior chamber. The anterior chamber was filled with Viscoat viscoelastic.  A 2.4 millimeter keratome was used to make a near-clear corneal incision at the 9:00 position.  A curvilinear capsulorrhexis was made with a cystotome and capsulorrhexis forceps.  Balanced salt solution was used to hydrodissect and hydrodelineate the nucleus.   Phacoemulsification was then used in stop and chop fashion to remove the lens nucleus and epinucleus.  The remaining cortex was then removed using the irrigation and aspiration handpiece. Provisc was then placed into the capsular bag to distend it for lens placement.  A lens was then injected into the capsular bag.  The remaining viscoelastic was aspirated.   Wounds were hydrated with balanced salt solution.  The anterior  chamber was inflated to a physiologic pressure with balanced salt solution.  No wound leaks were noted. Vigamox 0.2 ml of a 1mg  per ml solution was injected into the anterior chamber for a dose of 0.2 mg of intracameral antibiotic at the completion of the case.   Timolol and Brimonidine drops were applied to the eye.  The patient was taken to the recovery room in stable condition without complications of anesthesia or surgery.   Lanita Stammen 09/10/2016, 10:27 AM

## 2016-09-10 NOTE — Transfer of Care (Signed)
Immediate Anesthesia Transfer of Care Note  Patient: Kari Mcdonald  Procedure(s) Performed: Procedure(s): CATARACT EXTRACTION PHACO AND INTRAOCULAR LENS PLACEMENT (IOC) (Right)  Patient Location: PACU  Anesthesia Type: MAC  Level of Consciousness: awake, alert  and patient cooperative  Airway and Oxygen Therapy: Patient Spontanous Breathing and Patient connected to supplemental oxygen  Post-op Assessment: Post-op Vital signs reviewed, Patient's Cardiovascular Status Stable, Respiratory Function Stable, Patent Airway and No signs of Nausea or vomiting  Post-op Vital Signs: Reviewed and stable  Complications: No apparent anesthesia complications

## 2016-09-10 NOTE — Anesthesia Postprocedure Evaluation (Signed)
Anesthesia Post Note  Patient: Kari Mcdonald  Procedure(s) Performed: Procedure(s) (LRB): CATARACT EXTRACTION PHACO AND INTRAOCULAR LENS PLACEMENT (IOC) (Right)  Patient location during evaluation: PACU Anesthesia Type: MAC Level of consciousness: awake and alert and oriented Pain management: satisfactory to patient Vital Signs Assessment: post-procedure vital signs reviewed and stable Respiratory status: spontaneous breathing, nonlabored ventilation and respiratory function stable Cardiovascular status: blood pressure returned to baseline and stable Postop Assessment: Adequate PO intake and No signs of nausea or vomiting Anesthetic complications: no    Raliegh Ip

## 2016-09-11 ENCOUNTER — Encounter: Payer: Self-pay | Admitting: Ophthalmology

## 2016-11-17 ENCOUNTER — Encounter: Payer: Self-pay | Admitting: Emergency Medicine

## 2016-11-17 ENCOUNTER — Inpatient Hospital Stay
Admission: EM | Admit: 2016-11-17 | Discharge: 2016-11-19 | DRG: 641 | Disposition: A | Discharge status: Home / Self Care | Payer: Medicare Other | Attending: Internal Medicine | Admitting: Internal Medicine

## 2016-11-17 ENCOUNTER — Emergency Department: Payer: Medicare Other

## 2016-11-17 DIAGNOSIS — R634 Abnormal weight loss: Secondary | ICD-10-CM | POA: Diagnosis present

## 2016-11-17 DIAGNOSIS — H9190 Unspecified hearing loss, unspecified ear: Secondary | ICD-10-CM | POA: Diagnosis present

## 2016-11-17 DIAGNOSIS — M810 Age-related osteoporosis without current pathological fracture: Secondary | ICD-10-CM | POA: Diagnosis present

## 2016-11-17 DIAGNOSIS — Z79899 Other long term (current) drug therapy: Secondary | ICD-10-CM

## 2016-11-17 DIAGNOSIS — K210 Gastro-esophageal reflux disease with esophagitis: Secondary | ICD-10-CM | POA: Diagnosis present

## 2016-11-17 DIAGNOSIS — E860 Dehydration: Secondary | ICD-10-CM | POA: Diagnosis present

## 2016-11-17 DIAGNOSIS — E876 Hypokalemia: Secondary | ICD-10-CM | POA: Diagnosis present

## 2016-11-17 DIAGNOSIS — I959 Hypotension, unspecified: Secondary | ICD-10-CM | POA: Diagnosis present

## 2016-11-17 DIAGNOSIS — Z66 Do not resuscitate: Secondary | ICD-10-CM | POA: Diagnosis present

## 2016-11-17 DIAGNOSIS — R0902 Hypoxemia: Secondary | ICD-10-CM | POA: Diagnosis present

## 2016-11-17 DIAGNOSIS — J449 Chronic obstructive pulmonary disease, unspecified: Secondary | ICD-10-CM | POA: Diagnosis present

## 2016-11-17 DIAGNOSIS — I252 Old myocardial infarction: Secondary | ICD-10-CM

## 2016-11-17 DIAGNOSIS — F17210 Nicotine dependence, cigarettes, uncomplicated: Secondary | ICD-10-CM | POA: Diagnosis present

## 2016-11-17 DIAGNOSIS — Z7982 Long term (current) use of aspirin: Secondary | ICD-10-CM

## 2016-11-17 DIAGNOSIS — Z8249 Family history of ischemic heart disease and other diseases of the circulatory system: Secondary | ICD-10-CM

## 2016-11-17 DIAGNOSIS — M1990 Unspecified osteoarthritis, unspecified site: Secondary | ICD-10-CM | POA: Diagnosis present

## 2016-11-17 DIAGNOSIS — I10 Essential (primary) hypertension: Secondary | ICD-10-CM | POA: Diagnosis present

## 2016-11-17 DIAGNOSIS — K529 Noninfective gastroenteritis and colitis, unspecified: Secondary | ICD-10-CM | POA: Diagnosis present

## 2016-11-17 DIAGNOSIS — F329 Major depressive disorder, single episode, unspecified: Secondary | ICD-10-CM | POA: Diagnosis present

## 2016-11-17 DIAGNOSIS — G43A1 Cyclical vomiting, intractable: Secondary | ICD-10-CM

## 2016-11-17 DIAGNOSIS — N179 Acute kidney failure, unspecified: Secondary | ICD-10-CM | POA: Diagnosis present

## 2016-11-17 DIAGNOSIS — Z880 Allergy status to penicillin: Secondary | ICD-10-CM

## 2016-11-17 DIAGNOSIS — I2510 Atherosclerotic heart disease of native coronary artery without angina pectoris: Secondary | ICD-10-CM | POA: Diagnosis present

## 2016-11-17 DIAGNOSIS — R262 Difficulty in walking, not elsewhere classified: Secondary | ICD-10-CM

## 2016-11-17 DIAGNOSIS — R2681 Unsteadiness on feet: Secondary | ICD-10-CM

## 2016-11-17 DIAGNOSIS — R1115 Cyclical vomiting syndrome unrelated to migraine: Secondary | ICD-10-CM

## 2016-11-17 DIAGNOSIS — E785 Hyperlipidemia, unspecified: Secondary | ICD-10-CM | POA: Diagnosis present

## 2016-11-17 LAB — TROPONIN I
TROPONIN I: 0.03 ng/mL — AB (ref <0.03)
Troponin I: 0.04 ng/mL (ref <0.03)
Troponin I: 0.03 ng/mL (ref <0.03)

## 2016-11-17 LAB — CBC
HCT: 39.6 % (ref 35.0–47.0)
Hemoglobin: 13.4 g/dL (ref 12.0–16.0)
MCH: 29.8 pg (ref 26.0–34.0)
MCHC: 33.9 g/dL (ref 32.0–36.0)
MCV: 87.9 fL (ref 80.0–100.0)
PLATELETS: 274 10*3/uL (ref 150–440)
RBC: 4.51 MIL/uL (ref 3.80–5.20)
RDW: 14 % (ref 11.5–14.5)
WBC: 12.8 10*3/uL — AB (ref 3.6–11.0)

## 2016-11-17 LAB — COMPREHENSIVE METABOLIC PANEL
ALT: 10 U/L — ABNORMAL LOW (ref 14–54)
AST: 18 U/L (ref 15–41)
Albumin: 3.4 g/dL — ABNORMAL LOW (ref 3.5–5.0)
Alkaline Phosphatase: 76 U/L (ref 38–126)
Anion gap: 10 (ref 5–15)
BUN: 38 mg/dL — ABNORMAL HIGH (ref 6–20)
CHLORIDE: 104 mmol/L (ref 101–111)
CO2: 21 mmol/L — AB (ref 22–32)
Calcium: 8.5 mg/dL — ABNORMAL LOW (ref 8.9–10.3)
Creatinine, Ser: 3.08 mg/dL — ABNORMAL HIGH (ref 0.44–1.00)
GFR calc Af Amer: 15 mL/min — ABNORMAL LOW (ref >60)
GFR, EST NON AFRICAN AMERICAN: 13 mL/min — AB (ref >60)
Glucose, Bld: 104 mg/dL — ABNORMAL HIGH (ref 65–99)
POTASSIUM: 2.7 mmol/L — AB (ref 3.5–5.1)
SODIUM: 135 mmol/L (ref 135–145)
Total Bilirubin: 0.4 mg/dL (ref 0.3–1.2)
Total Protein: 6.3 g/dL — ABNORMAL LOW (ref 6.5–8.1)

## 2016-11-17 LAB — LACTIC ACID, PLASMA
Lactic Acid, Venous: 0.7 mmol/L (ref 0.5–1.9)
Lactic Acid, Venous: 1.3 mmol/L (ref 0.5–1.9)

## 2016-11-17 LAB — URINALYSIS, COMPLETE (UACMP) WITH MICROSCOPIC
BACTERIA UA: NONE SEEN
Bilirubin Urine: NEGATIVE
GLUCOSE, UA: NEGATIVE mg/dL
HGB URINE DIPSTICK: NEGATIVE
Ketones, ur: NEGATIVE mg/dL
NITRITE: NEGATIVE
Protein, ur: NEGATIVE mg/dL
SPECIFIC GRAVITY, URINE: 1.013 (ref 1.005–1.030)
pH: 5 (ref 5.0–8.0)

## 2016-11-17 LAB — C DIFFICILE QUICK SCREEN W PCR REFLEX
C Diff antigen: NEGATIVE
C Diff interpretation: NOT DETECTED
C Diff toxin: NEGATIVE

## 2016-11-17 LAB — LIPASE, BLOOD: LIPASE: 14 U/L (ref 11–51)

## 2016-11-17 LAB — MAGNESIUM: Magnesium: 1.9 mg/dL (ref 1.7–2.4)

## 2016-11-17 MED ORDER — ONDANSETRON HCL 4 MG PO TABS: 4.0000 mg | ORAL_TABLET | Freq: Four times a day (QID) | ORAL | Status: DC | PRN

## 2016-11-17 MED ORDER — PANTOPRAZOLE SODIUM 40 MG PO TBEC: 40.0000 mg | DELAYED_RELEASE_TABLET | Freq: Every day | ORAL | Status: DC

## 2016-11-17 MED ORDER — ALBUTEROL SULFATE (2.5 MG/3ML) 0.083% IN NEBU: 2.5000 mg | INHALATION_SOLUTION | Freq: Four times a day (QID) | RESPIRATORY_TRACT | Status: DC | PRN

## 2016-11-17 MED ORDER — SODIUM CHLORIDE 0.9 % IV BOLUS (SEPSIS)
500.0000 mL | Freq: Once | INTRAVENOUS | Status: AC
  Administered 2016-11-17: 500 mL via INTRAVENOUS

## 2016-11-17 MED ORDER — TIOTROPIUM BROMIDE MONOHYDRATE 18 MCG IN CAPS
18.0000 ug | ORAL_CAPSULE | Freq: Every day | RESPIRATORY_TRACT | Status: DC
  Administered 2016-11-17 – 2016-11-19 (×3): 18 ug via RESPIRATORY_TRACT
  Filled 2016-11-17: qty 5

## 2016-11-17 MED ORDER — ACETAMINOPHEN 650 MG RE SUPP: 650.0000 mg | Freq: Four times a day (QID) | RECTAL | Status: DC | PRN

## 2016-11-17 MED ORDER — TRAZODONE HCL 100 MG PO TABS
200.0000 mg | ORAL_TABLET | Freq: Every day | ORAL | Status: DC
  Administered 2016-11-17 – 2016-11-18 (×2): 200 mg via ORAL
  Filled 2016-11-17 (×2): qty 2

## 2016-11-17 MED ORDER — PANTOPRAZOLE SODIUM 40 MG PO TBEC
40.0000 mg | DELAYED_RELEASE_TABLET | Freq: Every day | ORAL | Status: DC
  Administered 2016-11-17 – 2016-11-19 (×3): 40 mg via ORAL
  Filled 2016-11-17 (×3): qty 1

## 2016-11-17 MED ORDER — ENOXAPARIN SODIUM 30 MG/0.3ML ~~LOC~~ SOLN
30.0000 mg | SUBCUTANEOUS | Status: DC
  Administered 2016-11-17 – 2016-11-18 (×2): 30 mg via SUBCUTANEOUS
  Filled 2016-11-17 (×2): qty 0.3

## 2016-11-17 MED ORDER — ACETAMINOPHEN 500 MG PO TABS
500.0000 mg | ORAL_TABLET | Freq: Once | ORAL | Status: AC
  Administered 2016-11-17: 500 mg via ORAL

## 2016-11-17 MED ORDER — ASPIRIN EC 81 MG PO TBEC
81.0000 mg | DELAYED_RELEASE_TABLET | Freq: Every day | ORAL | Status: DC
  Administered 2016-11-17 – 2016-11-19 (×3): 81 mg via ORAL
  Filled 2016-11-17 (×3): qty 1

## 2016-11-17 MED ORDER — MAGNESIUM SULFATE IN D5W 1-5 GM/100ML-% IV SOLN
1.0000 g | Freq: Once | INTRAVENOUS | Status: AC
  Administered 2016-11-17: 1 g via INTRAVENOUS
  Filled 2016-11-17: qty 100

## 2016-11-17 MED ORDER — ACETAMINOPHEN 325 MG PO TABS: 650.0000 mg | ORAL_TABLET | Freq: Four times a day (QID) | ORAL | Status: DC | PRN

## 2016-11-17 MED ORDER — ONDANSETRON HCL 4 MG/2ML IJ SOLN: 4.0000 mg | Freq: Four times a day (QID) | INTRAMUSCULAR | Status: DC | PRN

## 2016-11-17 MED ORDER — SODIUM CHLORIDE 0.9% FLUSH
3.0000 mL | Freq: Two times a day (BID) | INTRAVENOUS | Status: DC
  Administered 2016-11-17 – 2016-11-18 (×2): 3 mL via INTRAVENOUS

## 2016-11-17 MED ORDER — CITALOPRAM HYDROBROMIDE 20 MG PO TABS
40.0000 mg | ORAL_TABLET | Freq: Every day | ORAL | Status: DC
  Administered 2016-11-17 – 2016-11-19 (×3): 40 mg via ORAL
  Filled 2016-11-17 (×3): qty 2

## 2016-11-17 MED ORDER — ALBUTEROL SULFATE HFA 108 (90 BASE) MCG/ACT IN AERS: 2.0000 | INHALATION_SPRAY | Freq: Four times a day (QID) | RESPIRATORY_TRACT | Status: DC | PRN

## 2016-11-17 MED ORDER — SODIUM CHLORIDE 0.9 % IV SOLN
30.0000 meq | Freq: Once | INTRAVENOUS | Status: AC
  Administered 2016-11-17: 30 meq via INTRAVENOUS
  Filled 2016-11-17: qty 15

## 2016-11-17 MED ORDER — POTASSIUM CHLORIDE IN NACL 20-0.9 MEQ/L-% IV SOLN
INTRAVENOUS | Status: AC
  Administered 2016-11-17 – 2016-11-18 (×2): via INTRAVENOUS
  Filled 2016-11-17 (×3): qty 1000

## 2016-11-17 MED ORDER — ACETAMINOPHEN 500 MG PO TABS
ORAL_TABLET | ORAL | Status: AC
  Administered 2016-11-17: 500 mg via ORAL
  Filled 2016-11-17: qty 1

## 2016-11-17 NOTE — ED Triage Notes (Signed)
Pt in via EMS; pt sent over from [facility] where she is being followed for N/V x approximately 2 months.  [place] reports BP 60/40 and oxygen saturation 86% on room air.  Pt given 500cc bolus by EMS, on 3L nasal cannula upon arrival.  EMS reports last BP 94/43.  Pt A/Ox4, vitals WDL at this time.

## 2016-11-17 NOTE — Consult Note (Signed)
Lucilla Lame, MD [address] [place],  [address] Phone: [phone] Fax : [phone]  Consultation  Referring Provider:     Dr. Mar Daring Primary Care Physician:  [facility], Chrissie Noa, MD Primary Gastroenterologist:  Dr. Gustavo Lah         Reason for Consultation:     Nausea vomiting diarrhea  Date of Admission:  11/17/2016 Date of Consultation:  11/17/2016         HPI:   Kari Mcdonald is a 83 y.o. female who reports that she has been having nausea and vomiting with diarrhea for the last few months.  The patient also reports that she has had significant weight loss. The patient was evaluated by Dr. Gustavo Lah who did a upper endoscopy on the patient back in October.  The patient reports that she had not gotten her results of this and then went to see his dictation of this morning in the office.  At that time the patient was found to have hypotension and was sent to the ER.  The patient also reports that she has not had a colonoscopy for the workup of her diarrhea.  She states that she has 10 bowel movements a day and usually has up to one bowel movement per hour all night long.  She did report that she underwent a colonoscopy a significant time ago and states that she was told that she had a difficult colon to transverse.  The patient denies any black stools or bloody stools.  She also denies any significant abdominal pain.  The patient's endoscopy showed her to have intestinal metaplasia in her stomach with esophagitis that was reported to be possibly pill induced.   Past Medical History:  Diagnosis Date  . Anemia   . Arthritis    left knee  . Cancer ([place])    Skin Cancer  . COPD (chronic obstructive pulmonary disease) ([place])   . Coronary artery disease   . Depression   . Diverticulosis   . Dysphagia   . Dyspnea   . GERD (gastroesophageal reflux disease)   . GI bleed   . Headache    from eye strain  . History of hiatal hernia   . HOH (hard of hearing)   .  Hyperlipidemia   . Hypertension   . Myocardial infarction    2006  . Osteoporosis   . Wears dentures    full upper and lower    Past Surgical History:  Procedure Laterality Date  . ABDOMINAL HYSTERECTOMY    . APPENDECTOMY    . CATARACT EXTRACTION W/PHACO Left 08/06/2016   Procedure: CATARACT EXTRACTION PHACO AND INTRAOCULAR LENS PLACEMENT (IOC);  Surgeon: Leandrew Koyanagi, MD;  Location: [place];  Service: Ophthalmology;  Laterality: Left;  . CATARACT EXTRACTION W/PHACO Right 09/10/2016   Procedure: CATARACT EXTRACTION PHACO AND INTRAOCULAR LENS PLACEMENT (IOC);  Surgeon: Leandrew Koyanagi, MD;  Location: [place];  Service: Ophthalmology;  Laterality: Right;  . CHOLECYSTECTOMY    . COLONOSCOPY    . ESOPHAGOGASTRODUODENOSCOPY    . ESOPHAGOGASTRODUODENOSCOPY (EGD) WITH PROPOFOL N/A 07/21/2016   Procedure: ESOPHAGOGASTRODUODENOSCOPY (EGD) WITH PROPOFOL;  Surgeon: Lollie Sails, MD;  Location: [facility] ENDOSCOPY;  Service: Endoscopy;  Laterality: N/A;  . GANGLION CYST EXCISION Left    wrist (x2)  . PILONIDAL CYST EXCISION      Prior to Admission medications   Medication Sig Start Date End Date Taking? Authorizing Provider  albuterol (PROVENTIL HFA;VENTOLIN HFA) 108 (90 Base) MCG/ACT inhaler Inhale 2  puffs into the lungs every 6 (six) hours as needed for wheezing or shortness of breath.   Yes Historical Provider, MD  aspirin EC 81 MG tablet Take 81 mg by mouth daily.   Yes Historical Provider, MD  atorvastatin (LIPITOR) 20 MG tablet Take 20 mg by mouth daily.   Yes Historical Provider, MD  citalopram (CELEXA) 40 MG tablet Take 40 mg by mouth daily.   Yes Historical Provider, MD  dexlansoprazole (DEXILANT) 60 MG capsule Take 60 mg by mouth daily.   Yes Historical Provider, MD  enalapril (VASOTEC) 20 MG tablet Take 20 mg by mouth daily. 11/13/16  Yes Historical Provider, MD  Lactobacillus (PROBIOTIC ACIDOPHILUS PO) Take 1 tablet by mouth daily.   Yes Historical  Provider, MD  pantoprazole (PROTONIX) 40 MG tablet Take 40 mg by mouth daily.   Yes Historical Provider, MD  tiotropium (SPIRIVA) 18 MCG inhalation capsule Place 18 mcg into inhaler and inhale daily.   Yes Historical Provider, MD  traZODone (DESYREL) 100 MG tablet Take 200 mg by mouth at bedtime.   Yes Historical Provider, MD  triamterene-hydrochlorothiazide (MAXZIDE) 75-50 MG tablet Take 1 tablet by mouth daily.   Yes Historical Provider, MD    History reviewed. No pertinent family history.   Social History  Substance Use Topics  . Smoking status: Current Every Day Smoker    Packs/day: 0.50    Years: 60.00  . Smokeless tobacco: Never Used  . Alcohol use No    Allergies as of 11/17/2016 - Review Complete 11/17/2016  Allergen Reaction Noted  . Penicillins Rash 08/09/2015    Review of Systems:    All systems reviewed and negative except where noted in HPI.   Physical Exam:  Vital signs in last 24 hours: Temp:  [97.7 F (36.5 C)-97.8 F (36.6 C)] 97.8 F (36.6 C) (02/12 1445) Pulse Rate:  [59-64] 59 (02/12 1445) Resp:  [16-20] 16 (02/12 1445) BP: (90-118)/(41-76) 99/41 (02/12 1445) SpO2:  [95 %-100 %] 100 % (02/12 1445) Weight:  [150 lb (68 kg)-153 lb 11.2 oz (69.7 kg)] 153 lb 11.2 oz (69.7 kg) (02/12 1445)   General:   Pleasant, cooperative in NAD Head:  Normocephalic and atraumatic. Eyes:   No icterus.   Conjunctiva pink. PERRLA. Ears:  Normal auditory acuity. Neck:  Supple; no masses or thyroidomegaly Lungs: Respirations even and unlabored. Lungs clear to auscultation bilaterally.   No wheezes, crackles, or rhonchi.  Heart:  Regular rate and rhythm;  Without murmur, clicks, rubs or gallops Abdomen:  Soft, nondistended, nontender. Normal bowel sounds. No appreciable masses or hepatomegaly.  No rebound or guarding.  Rectal:  Not performed. Msk:  Symmetrical without gross deformities.    Extremities:  Without edema, cyanosis or clubbing. Neurologic:  Alert and oriented  x3;  grossly normal neurologically. Skin:  Intact without significant lesions or rashes. Cervical Nodes:  No significant cervical adenopathy. Psych:  Alert and cooperative. Normal affect.  LAB RESULTS:  Recent Labs  11/17/16 1114  WBC 12.8*  HGB 13.4  HCT 39.6  PLT 274   BMET  Recent Labs  11/17/16 1114  NA 135  K 2.7*  CL 104  CO2 21*  GLUCOSE 104*  BUN 38*  CREATININE 3.08*  CALCIUM 8.5*   LFT  Recent Labs  11/17/16 1114  PROT 6.3*  ALBUMIN 3.4*  AST 18  ALT 10*  ALKPHOS 76  BILITOT 0.4   PT/INR No results for input(s): LABPROT, INR in the last 72 hours.  STUDIES: Dg  Abd Portable 2 Views  Result Date: 11/17/2016 CLINICAL DATA:  Vomiting and loose stools for 2 months EXAM: PORTABLE ABDOMEN - 2 VIEW COMPARISON:  Abdominal CT 07/11/2009 FINDINGS: The bowel gas pattern is normal. There is no evidence of free air. No radio-opaque calculi or other significant radiographic abnormality is seen. EKG leads create artifact over the abdomen. IMPRESSION: Negative. Electronically Signed   By: Monte Fantasia M.D.   On: 11/17/2016 11:39      Impression / Plan:   Kari Mcdonald is a 83 y.o. y/o female with weight loss diarrhea and nausea.  The patient may have microscopic colitis Although the patient states she has had no further diarrhea since being admitted to the hospital.  She has been placed on a clear liquid diet.  Although this is not classic for lactose intolerance it is curious that the patient's diarrhea should stop when she has been admitted to the hospital on a clear liquid diet.  The patient has been told that in her weakened state with hypotension on admission it is unlikely that we will need proceed with a colonoscopy at this time.  She has been told that if the diarrhea returns that she should undergo a colonoscopy with biopsies to rule out microscopic colitis.  The patient has been explained the plan and agrees with it.  Thank you for involving me in the  care of this patient.      LOS: 0 days   Lucilla Lame, MD  11/17/2016, 7:32 PM   Note: This dictation was prepared with Dragon dictation along with smaller phrase technology. Any transcriptional errors that result from this process are unintentional.

## 2016-11-17 NOTE — ED Provider Notes (Signed)
[facility] Emergency Department Provider Note   ____________________________________________   First MD Initiated Contact with Patient 11/17/16 1104     (approximate)  I have reviewed the triage vital signs and the nursing notes.   HISTORY  Chief Complaint Hypotension  HPI Kari Mcdonald is a 83 y.o. female here for evaluation of vomiting and diarrhea  The patient is here with her son, she reports that for the last 2 months she is had difficulty eating anything because she will often vomit, she's also been having loose watery stools most every few hours for over 2 months with about a 12 pound weight loss.  She denies being any pain. No chest pain or short of breath. No fevers. She occasionally gets nauseated and will vomit. She is able to keep small amounts of water down, but will often throw up.  She reports she been feeling fatigued and a little lightheaded for the last several days which prompted her to go to the clinic.  The clinic her blood pressure was noted to be 60 systolic, with associated bradycardia in the low oxygen saturation  She denies any shortness of breath. She does not use oxygen at home.  Patient family reports that a couple months ago she had a small bowel follow-through, they do not yet know the results of   Past Medical History:  Diagnosis Date  . Anemia   . Arthritis    left knee  . Cancer ([place])    Skin Cancer  . COPD (chronic obstructive pulmonary disease) ([place])   . Coronary artery disease   . Depression   . Diverticulosis   . Dysphagia   . Dyspnea   . GERD (gastroesophageal reflux disease)   . GI bleed   . Headache    from eye strain  . History of hiatal hernia   . HOH (hard of hearing)   . Hyperlipidemia   . Hypertension   . Myocardial infarction    2006  . Osteoporosis   . Wears dentures    full upper and lower    There are no active problems to display for this patient.   Past Surgical History:   Procedure Laterality Date  . ABDOMINAL HYSTERECTOMY    . APPENDECTOMY    . CATARACT EXTRACTION W/PHACO Left 08/06/2016   Procedure: CATARACT EXTRACTION PHACO AND INTRAOCULAR LENS PLACEMENT (IOC);  Surgeon: Leandrew Koyanagi, MD;  Location: [place];  Service: Ophthalmology;  Laterality: Left;  . CATARACT EXTRACTION W/PHACO Right 09/10/2016   Procedure: CATARACT EXTRACTION PHACO AND INTRAOCULAR LENS PLACEMENT (IOC);  Surgeon: Leandrew Koyanagi, MD;  Location: [place];  Service: Ophthalmology;  Laterality: Right;  . CHOLECYSTECTOMY    . COLONOSCOPY    . ESOPHAGOGASTRODUODENOSCOPY    . ESOPHAGOGASTRODUODENOSCOPY (EGD) WITH PROPOFOL N/A 07/21/2016   Procedure: ESOPHAGOGASTRODUODENOSCOPY (EGD) WITH PROPOFOL;  Surgeon: Lollie Sails, MD;  [facility] END[facility]COPY;  Service: Endoscopy;  Laterality: N/A;  . GANGLION CYST EXCISION Left    wrist (x2)  . PILONIDAL CYST EXCISION      Prior to Admission medications   Medication Sig Start Date End Date Taking? Authorizing Provider  albuterol (PROVENTIL HFA;VENTOLIN HFA) 108 (90 Base) MCG/ACT inhaler Inhale 2 puffs into the lungs every 6 (six) hours as needed for wheezing or shortness of breath.    Historical Provider, MD  aspirin EC 81 MG tablet Take 81 mg by mouth daily.    Historical Provider, MD  atorvastatin (LIPITOR) 20 MG tablet Take 20  mg by mouth daily.    Historical Provider, MD  citalopram (CELEXA) 40 MG tablet Take 40 mg by mouth daily.    Historical Provider, MD  dexlansoprazole (DEXILANT) 60 MG capsule Take 60 mg by mouth daily.    Historical Provider, MD  pantoprazole (PROTONIX) 40 MG tablet Take 40 mg by mouth daily.    Historical Provider, MD  tiotropium (SPIRIVA) 18 MCG inhalation capsule Place 18 mcg into inhaler and inhale daily.    Historical Provider, MD  traZODone (DESYREL) 100 MG tablet Take 200 mg by mouth at bedtime.    Historical Provider, MD  triamterene-hydrochlorothiazide (MAXZIDE) 75-50 MG  tablet Take 1 tablet by mouth daily.    Historical Provider, MD    Allergies Penicillins  No family history on file.  Social History Social History  Substance Use Topics  . Smoking status: Current Every Day Smoker    Packs/day: 0.50    Years: 60.00  . Smokeless tobacco: Never Used  . Alcohol use No    Review of Systems Constitutional: No fever/chills Eyes: No visual changes. ENT: No sore throat. Cardiovascular: Denies chest pain. Respiratory: Denies shortness of breath. Gastrointestinal: No abdominal pain.  No constipation. Genitourinary: Negative for dysuria. Musculoskeletal: Negative for back pain. Skin: Negative for rash. Neurological: Negative for headaches, focal weakness or numbness.  10-point ROS otherwise negative.  ____________________________________________   PHYSICAL EXAM:  VITAL SIGNS: ED Triage Vitals  Enc Vitals Group     BP 11/17/16 1052 90/70     Pulse Rate 11/17/16 1052 60     Resp 11/17/16 1052 20     Temp 11/17/16 1052 97.7 F (36.5 C)     Temp Source 11/17/16 1052 Oral     SpO2 11/17/16 1052 99 %     Weight 11/17/16 1053 150 lb (68 kg)     Height 11/17/16 1053 5\' 6"  (1.676 m)     Head Circumference --      Peak Flow --      Pain Score --      Pain Loc --      Pain Edu? --      Excl. in [place]? --     Constitutional: Alert and oriented. Well appearing and in no acute distress.Fingertips are somewhat cool, and pulse oximetry when placed on the finger reads in the mid 80s, but when placed on the ear lobe reads mid 90s on room air  Eyes: Conjunctivae are normal. PERRL. EOMI. Head: Atraumatic. Nose: No congestion/rhinnorhea. Mouth/Throat: Mucous membranes are dry.  Oropharynx non-erythematous. Neck: No stridor.   Cardiovascular: Normal rate, regular rhythm. Grossly normal heart sounds.  Good peripheral circulation. Respiratory: Normal respiratory effort.  No retractions. Lungs CTAB. Gastrointestinal: Soft and nontender. No distention.    Musculoskeletal: No lower extremity tenderness nor edema.  Neurologic:  Normal speech and language. No gross focal neurologic deficits are appreciated. No gait instability. Skin:  Skin is warm, dry and intact. No rash noted. Psychiatric: Mood and affect are normal. Speech and behavior are normal.  ____________________________________________   LABS (all labs ordered are listed, but only abnormal results are displayed)  Labs Reviewed  CBC - Abnormal; Notable for the following:       Result Value   WBC 12.8 (*)    All other components within normal limits  COMPREHENSIVE METABOLIC PANEL - Abnormal; Notable for the following:    Potassium 2.7 (*)    CO2 21 (*)    Glucose, Bld 104 (*)    BUN 38 (*)  Creatinine, Ser 3.08 (*)    Calcium 8.5 (*)    Total Protein 6.3 (*)    Albumin 3.4 (*)    ALT 10 (*)    GFR calc non Af Amer 13 (*)    GFR calc Af Amer 15 (*)    All other components within normal limits  TROPONIN I - Abnormal; Notable for the following:    Troponin I 0.03 (*)    All other components within normal limits  LIPASE, BLOOD  LACTIC ACID, PLASMA  MAGNESIUM  URINALYSIS, COMPLETE (UACMP) WITH MICROSCOPIC  LACTIC ACID, PLASMA   ____________________________________________  EKG  Reviewed and interpreted by me at 11:25 AM Heart rate 60 Cure as 110 QTc is markedly prolonged at 670 There is slurring of the T waves noted throughout multiple leads, concerning for possible electrolyte abnormality, felt less likely to represent a global ischemia  Normal sinus rhythm, prolonged QT, high concern for possible electrolyte abnormality based on the 12-lead ____________________________________________  RADIOLOGY  Dg Abd Portable 2 Views  Result Date: 11/17/2016 CLINICAL DATA:  Vomiting and loose stools for 2 months EXAM: PORTABLE ABDOMEN - 2 VIEW COMPARISON:  Abdominal CT 07/11/2009 FINDINGS: The bowel gas pattern is normal. There is no evidence of free air. No radio-opaque  calculi or other significant radiographic abnormality is seen. EKG leads create artifact over the abdomen. IMPRESSION: Negative. Electronically Signed   By: Monte Fantasia M.D.   On: 11/17/2016 11:39    ____________________________________________   PROCEDURES  Procedure(s) performed: None  Procedures  Critical Care performed: No  ____________________________________________   INITIAL IMPRESSION / ASSESSMENT AND PLAN / ED COURSE  Pertinent labs & imaging results that were available during my care of the patient were reviewed by me and considered in my medical decision making (see chart for details).    Clinical Course as of Nov 17 1217  Mon Nov 17, 2016  1114 Blood pressure improved, [id number] systolic presently. Awake and alert with no complaint at this time  [MQ]    Clinical Course User Index [MQ] Delman Kitten, MD   Placed a call to GI clinic, [facility] for consultation and recommendations.  ----------------------------------------- 12:18 PM on 11/17/2016 -----------------------------------------  Patient being admitted, dehydration with electrolyte abnormality including prolonged QT on EKG with hypokalemia. Patient and family agreeable with plan for admission. Do not feel the patient is in need of emergent CT scan of the abdomen and pelvis, patient's symptoms including vomiting and diarrhea apparently been ongoing for over 2 months. Further workup under the hospitalist service, also anticipating and gastroenterology consultation. ____________________________________________   FINAL CLINICAL IMPRESSION(S) / ED DIAGNOSES  Final diagnoses:  AKI (acute kidney injury) ([place])  Hypokalemia  Chronic diarrhea      NEW MEDICATIONS STARTED DURING THIS VISIT:  New Prescriptions   No medications on file     Note:  This document was prepared using Dragon voice recognition software and may include unintentional dictation errors.     Delman Kitten, MD 11/17/16 [phone]

## 2016-11-17 NOTE — H&P (Signed)
[facility] [place] NAME: Kari Mcdonald    MR#:  [id number]  DATE OF BIRTH:  10-21-1931  DATE OF ADMISSION:  11/17/2016  PRIMARY CARE PHYSICIAN: FELDPAUSCH, Chrissie Noa, MD   REQUESTING/REFERRING PHYSICIAN: QUALE  Vomiting and diarrhea HISTORY OF PRESENT ILLNESS:  Kari Mcdonald  is a 83 y.o. female with a known history of COPD, coronary artery disease, GERD and multiple other medical problems has been experiencing 3 month history of nausea vomiting and diarrhea. Patient has been followed up by gastroenterologist Dr.SKULSK, today patient was seen by GI PA  and patient was  very hypotensive and she was sent over to the emergency department .patient's systolic blood pressure was at 60 was found to be hypoxemic. Hospitalist team is called to admit the patient.   PAST MEDICAL HISTORY:   Past Medical History:  Diagnosis Date  . Anemia   . Arthritis    left knee  . Cancer ([place])    Skin Cancer  . COPD (chronic obstructive pulmonary disease) ([place])   . Coronary artery disease   . Depression   . Diverticulosis   . Dysphagia   . Dyspnea   . GERD (gastroesophageal reflux disease)   . GI bleed   . Headache    from eye strain  . History of hiatal hernia   . HOH (hard of hearing)   . Hyperlipidemia   . Hypertension   . Myocardial infarction    2006  . Osteoporosis   . Wears dentures    full upper and lower    PAST SURGICAL HISTOIRY:   Past Surgical History:  Procedure Laterality Date  . ABDOMINAL HYSTERECTOMY    . APPENDECTOMY    . CATARACT EXTRACTION W/PHACO Left 08/06/2016   Procedure: CATARACT EXTRACTION PHACO AND INTRAOCULAR LENS PLACEMENT (IOC);  Surgeon: Leandrew Koyanagi, MD;  Location: [place];  Service: Ophthalmology;  Laterality: Left;  . CATARACT EXTRACTION W/PHACO Right 09/10/2016   Procedure: CATARACT EXTRACTION PHACO AND INTRAOCULAR LENS PLACEMENT (IOC);  Surgeon: Leandrew Koyanagi, MD;  Location:  [place];  Service: Ophthalmology;  Laterality: Right;  . CHOLECYSTECTOMY    . COLONOSCOPY    . ESOPHAGOGASTRODUODENOSCOPY    . ESOPHAGOGASTRODUODENOSCOPY (EGD) WITH PROPOFOL N/A 07/21/2016   Procedure: ESOPHAGOGASTRODUODENOSCOPY (EGD) WITH PROPOFOL;  Surgeon: Lollie Sails, MD;  Location: [facility] ENDOSCOPY;  Service: Endoscopy;  Laterality: N/A;  . GANGLION CYST EXCISION Left    wrist (x2)  . PILONIDAL CYST EXCISION      SOCIAL HISTORY:   Social History  Substance Use Topics  . Smoking status: Current Every Day Smoker    Packs/day: 0.50    Years: 60.00  . Smokeless tobacco: Never Used  . Alcohol use No    FAMILY HISTORY:  Hypertension runs in her family   DRUG ALLERGIES:   Allergies  Allergen Reactions  . Penicillins Rash    REVIEW OF SYSTEMS:  CONSTITUTIONAL: No fever, fatigue or weakness.  EYES: No blurred or double vision.  EARS, NOSE, AND THROAT: No tinnitus or ear pain.  RESPIRATORY: No cough, shortness of breath, wheezing or hemoptysis.  CARDIOVASCULAR: No chest pain, orthopnea, edema.  GASTROINTESTINAL: chronic  nausea, vomiting, diarrhea for 3 mons; denies abdominal pain.  GENITOURINARY: No dysuria, hematuria.  ENDOCRINE: No polyuria, nocturia,  HEMATOLOGY: No anemia, easy bruising or bleeding SKIN: No rash or lesion. MUSCULOSKELETAL: No joint pain or arthritis.   NEUROLOGIC: No tingling, numbness, weakness.  PSYCHIATRY: No anxiety or depression.  MEDICATIONS AT HOME:   Prior to Admission medications   Medication Sig Start Date End Date Taking? Authorizing Provider  albuterol (PROVENTIL HFA;VENTOLIN HFA) 108 (90 Base) MCG/ACT inhaler Inhale 2 puffs into the lungs every 6 (six) hours as needed for wheezing or shortness of breath.   Yes Historical Provider, MD  aspirin EC 81 MG tablet Take 81 mg by mouth daily.   Yes Historical Provider, MD  atorvastatin (LIPITOR) 20 MG tablet Take 20 mg by mouth daily.   Yes Historical Provider, MD   citalopram (CELEXA) 40 MG tablet Take 40 mg by mouth daily.   Yes Historical Provider, MD  dexlansoprazole (DEXILANT) 60 MG capsule Take 60 mg by mouth daily.   Yes Historical Provider, MD  enalapril (VASOTEC) 20 MG tablet Take 20 mg by mouth daily. 11/13/16  Yes Historical Provider, MD  Lactobacillus (PROBIOTIC ACIDOPHILUS PO) Take 1 tablet by mouth daily.   Yes Historical Provider, MD  pantoprazole (PROTONIX) 40 MG tablet Take 40 mg by mouth daily.   Yes Historical Provider, MD  tiotropium (SPIRIVA) 18 MCG inhalation capsule Place 18 mcg into inhaler and inhale daily.   Yes Historical Provider, MD  traZODone (DESYREL) 100 MG tablet Take 200 mg by mouth at bedtime.   Yes Historical Provider, MD  triamterene-hydrochlorothiazide (MAXZIDE) 75-50 MG tablet Take 1 tablet by mouth daily.   Yes Historical Provider, MD      VITAL SIGNS:  Blood pressure 118/76, pulse 64, temperature 97.7 F (36.5 C), temperature source Oral, resp. rate 20, height 5\' 6"  (1.676 m), weight 68 kg (150 lb), SpO2 95 %.  PHYSICAL EXAMINATION:  GENERAL:  83 y.o.-year-old patient lying in the bed with no acute distress.  EYES: Pupils equal, round, reactive to light and accommodation. No scleral icterus. Extraocular muscles intact.  HEENT: Head atraumatic, normocephalic. Oropharynx and nasopharynx clear. Dry Mucous membranes NECK:  Supple, no jugular venous distention. No thyroid enlargement, no tenderness.  LUNGS: Normal breath sounds bilaterally, no wheezing, rales,rhonchi or crepitation. No use of accessory muscles of respiration.  CARDIOVASCULAR: S1, S2 normal. No murmurs, rubs, or gallops.  ABDOMEN: Soft, nontender, nondistended. Bowel sounds present. No organomegaly or mass.  EXTREMITIES: No pedal edema, cyanosis, or clubbing.  NEUROLOGIC: Cranial nerves II through XII are intact. Muscle strength 5/5 in all extremities. Sensation intact. Gait not checked.  PSYCHIATRIC: The patient is alert and oriented x 3.  SKIN: No  obvious rash, lesion, or ulcer.   LABORATORY PANEL:   CBC  Recent Labs Lab 11/17/16 1114  WBC 12.8*  HGB 13.4  HCT 39.6  PLT 274   ------------------------------------------------------------------------------------------------------------------  Chemistries   Recent Labs Lab 11/17/16 1114  NA 135  K 2.7*  CL 104  CO2 21*  GLUCOSE 104*  BUN 38*  CREATININE 3.08*  CALCIUM 8.5*  MG 1.9  AST 18  ALT 10*  ALKPHOS 76  BILITOT 0.4   ------------------------------------------------------------------------------------------------------------------  Cardiac Enzymes  Recent Labs Lab 11/17/16 1114  TROPONINI 0.03*   ------------------------------------------------------------------------------------------------------------------  RADIOLOGY:  Dg Abd Portable 2 Views  Result Date: 11/17/2016 CLINICAL DATA:  Vomiting and loose stools for 2 months EXAM: PORTABLE ABDOMEN - 2 VIEW COMPARISON:  Abdominal CT 07/11/2009 FINDINGS: The bowel gas pattern is normal. There is no evidence of free air. No radio-opaque calculi or other significant radiographic abnormality is seen. EKG leads create artifact over the abdomen. IMPRESSION: Negative. Electronically Signed   By: Monte Fantasia M.D.   On: 11/17/2016 11:39    EKG:   Orders  placed or performed during the hospital encounter of 11/17/16  . ED EKG  . ED EKG  . EKG 12-Lead  . EKG 12-Lead    IMPRESSION AND PLAN:   Kari Mcdonald  is a 83 y.o. female with a known history of COPD, coronary artery disease, GERD and multiple other medical problems has been experiencing 3 month history of nausea vomiting and diarrhea. Patient has been followed up by gastroenterologist Dr.SKULSK, today patient was seen by GI PA  and patient was  very hypotensive  #acute kidney injury prerenal from dehydration Admit med -surg pt  hydrated with IV fluids   monitor renal function closely   #Severe hypotension from dehydration Blood pressure  is improving with IV fluids , continue IV fluids   #Chronic vomiting and diarrhea had EGD with Dr Alisia Ferrari afterward with abnormal esophageal motility, la grade B esophagitis, and gastritis. There was no H pylori, Barretts, dysplasia/malignancy. IV fluids Check stool for C. difficile toxin Consult GI Monitor electrolytes PPI  #Hypokalemia replete and recheck in a.m.   #Chronic COPD stable Nebulizers as needed       All the records are reviewed and case discussed with ED provider. Management plans discussed with the patient, family and they are in agreement.  CODE STATUS: DNR, son is HCPOA  TOTAL TIME TAKING CARE OF THIS PATIENT: 43  minutes.   Note: This dictation was prepared with Dragon dictation along with smaller phrase technology. Any transcriptional errors that result from this process are unintentional.  Nicholes Mango M.D on 11/17/2016 at 2:22 PM  Between 7am to 6pm - Pager - [phone]  After 6pm go to [email or web address] - password [email or web address] [facility]  [place] [facility]  Office  [phone]  CC: Primary care physician; [facility], Chrissie Noa, MD

## 2016-11-17 NOTE — Progress Notes (Signed)
Family Meeting Note  Advance Directive:yes  Today a meeting took place with the Patient, son and daughter at bedside   The following clinical team members were present during this meeting:MD  The following were discussed:Patient's diagnosis: , Patient's progosis: Unable to determine and Goals for treatment: DNR, son is the healthcare power of attorney. Not considering palliative care  Additional follow-up to be provided: Hospitalist and gastro-  Time spent during discussion:18 MIN  Nicholes Mango, MD

## 2016-11-18 LAB — COMPREHENSIVE METABOLIC PANEL
ALT: 7 U/L — AB (ref 14–54)
AST: 19 U/L (ref 15–41)
Albumin: 2.8 g/dL — ABNORMAL LOW (ref 3.5–5.0)
Alkaline Phosphatase: 63 U/L (ref 38–126)
Anion gap: 10 (ref 5–15)
BUN: 34 mg/dL — AB (ref 6–20)
CHLORIDE: 114 mmol/L — AB (ref 101–111)
CO2: 15 mmol/L — ABNORMAL LOW (ref 22–32)
CREATININE: 2.09 mg/dL — AB (ref 0.44–1.00)
Calcium: 8.4 mg/dL — ABNORMAL LOW (ref 8.9–10.3)
GFR calc Af Amer: 24 mL/min — ABNORMAL LOW (ref >60)
GFR, EST NON AFRICAN AMERICAN: 21 mL/min — AB (ref >60)
GLUCOSE: 77 mg/dL (ref 65–99)
Potassium: 2.9 mmol/L — ABNORMAL LOW (ref 3.5–5.1)
Sodium: 139 mmol/L (ref 135–145)
Total Bilirubin: 0.5 mg/dL (ref 0.3–1.2)
Total Protein: 5.3 g/dL — ABNORMAL LOW (ref 6.5–8.1)

## 2016-11-18 LAB — TSH: TSH: 2.876 u[IU]/mL (ref 0.350–4.500)

## 2016-11-18 LAB — TROPONIN I: TROPONIN I: 0.04 ng/mL — AB (ref <0.03)

## 2016-11-18 MED ORDER — ADULT MULTIVITAMIN W/MINERALS CH
1.0000 | ORAL_TABLET | Freq: Every day | ORAL | Status: DC
  Administered 2016-11-18 – 2016-11-19 (×2): 1 via ORAL
  Filled 2016-11-18 (×2): qty 1

## 2016-11-18 MED ORDER — ENSURE ENLIVE PO LIQD
237.0000 mL | Freq: Three times a day (TID) | ORAL | Status: DC
  Administered 2016-11-18 – 2016-11-19 (×3): 237 mL via ORAL

## 2016-11-18 MED ORDER — POTASSIUM CHLORIDE CRYS ER 20 MEQ PO TBCR
40.0000 meq | EXTENDED_RELEASE_TABLET | Freq: Two times a day (BID) | ORAL | Status: AC
  Administered 2016-11-18 (×2): 40 meq via ORAL
  Filled 2016-11-18 (×2): qty 2

## 2016-11-18 NOTE — Progress Notes (Signed)
[facility] [place] NAME: Demari Birks    MR#:  [id number]  DATE OF BIRTH:  Nov 17, 1931  SUBJECTIVE:  CHIEF COMPLAINT:   Chief Complaint  Patient presents with  . Hypotension   No diarrhea since admission. Blood pressure is still low. REVIEW OF SYSTEMS:  Review of Systems  Constitutional: Positive for malaise/fatigue. Negative for chills and fever.  HENT: Negative for congestion and sore throat.   Eyes: Negative for blurred vision and double vision.  Respiratory: Negative for cough and shortness of breath.   Cardiovascular: Negative for chest pain and leg swelling.  Gastrointestinal: Negative for abdominal pain, blood in stool, constipation, diarrhea, melena, nausea and vomiting.  Genitourinary: Negative for dysuria and hematuria.  Musculoskeletal: Negative for joint pain.  Skin: Negative for itching and rash.  Neurological: Positive for dizziness and weakness. Negative for focal weakness, loss of consciousness and headaches.  Psychiatric/Behavioral: Negative for depression. The patient is not nervous/anxious.     DRUG ALLERGIES:   Allergies  Allergen Reactions  . Penicillins Rash   VITALS:  Blood pressure (!) 144/41, pulse (!) 57, temperature 97.5 F (36.4 C), temperature source Oral, resp. rate 18, height 5\' 6"  (1.676 m), weight 153 lb 11.2 oz (69.7 kg), SpO2 93 %. PHYSICAL EXAMINATION:  Physical Exam  Constitutional: She is oriented to person, place, and time and well-developed, well-nourished, and in no distress.  HENT:  Head: Normocephalic.  Mouth/Throat: Oropharynx is clear and moist.  Eyes: Conjunctivae and EOM are normal.  Neck: Normal range of motion. Neck supple. No JVD present. No tracheal deviation present.  Cardiovascular: Normal rate, regular rhythm and normal heart sounds.  Exam reveals no gallop.   No murmur heard. Pulmonary/Chest: Effort normal and breath sounds normal. No respiratory distress. She has  no wheezes. She has no rales.  Abdominal: Soft. Bowel sounds are normal. She exhibits no distension. There is no tenderness.  Musculoskeletal: Normal range of motion. She exhibits no edema or tenderness.  Neurological: She is alert and oriented to person, place, and time. No cranial nerve deficit.  Skin: No rash noted. No erythema.  Psychiatric: Affect normal.   LABORATORY PANEL:  Female CBC  Recent Labs Lab 11/17/16 1114  WBC 12.8*  HGB 13.4  HCT 39.6  PLT 274   ------------------------------------------------------------------------------------------------------------------ Chemistries   Recent Labs Lab 11/17/16 1114 11/18/16 0242  NA 135 139  K 2.7* 2.9*  CL 104 114*  CO2 21* 15*  GLUCOSE 104* 77  BUN 38* 34*  CREATININE 3.08* 2.09*  CALCIUM 8.5* 8.4*  MG 1.9  --   AST 18 19  ALT 10* 7*  ALKPHOS 76 63  BILITOT 0.4 0.5   RADIOLOGY:  No results found. ASSESSMENT AND PLAN:   Bijal Kiehl  is a 83 y.o. female with a known history of COPD, coronary artery disease, GERD and multiple other medical problems has been experiencing 3 month history of nausea vomiting and diarrhea. Patient has been followed up by gastroenterologist Dr.SKULSK, today patient was seen by GI PA  and patient was  very hypotensive  #acute kidney injury prerenal from dehydration Improving with IV fluids, follow-up BMP.  #Severe hypotension from dehydration Blood pressure is improving with IV fluids ,  but is still low continue IV fluids   #Chronic vomiting and diarrhea had EGD with Dr Alisia Ferrari afterward with abnormal esophageal motility, la grade B esophagitis, and gastritis. There was no H pylori, Barretts, dysplasia/malignancy. Improved, negative C. difficile toxin The  patient may have microscopic colitis,if the diarrhea returns that she should undergo a colonoscopy with biopsies to rule out microscopic colitis per Dr. Allen Norris.  #Hypokalemia, K is still low at 2.9,  repleted and recheck  in a.m.  #Chronic COPD stable  All the records are reviewed and case discussed with Care Management/Social Worker. Management plans discussed with the patient, family and they are in agreement.  CODE STATUS: DNR  TOTAL TIME TAKING CARE OF THIS PATIENT: 35 minutes.   More than 50% of the time was spent in counseling/coordination of care: YES  POSSIBLE D/C IN 2 DAYS, DEPENDING ON CLINICAL CONDITION.   Demetrios Loll M.D on 11/18/2016 at 2:52 PM  Between 7am to 6pm - Pager - [phone]  After 6pm go to [email or web address] - Proofreader  [employer] [place] [facility]  Office  [phone]  CC: Primary care physician; [facility], Chrissie Noa, MD  Note: This dictation was prepared with Dragon dictation along with smaller phrase technology. Any transcriptional errors that result from this process are unintentional.

## 2016-11-18 NOTE — Progress Notes (Signed)
Initial Nutrition Assessment  DOCUMENTATION CODES:   Severe malnutrition in context of chronic illness  INTERVENTION:  Encouraged adequate intake of calories and protein through meals, snacks, and beverages to meet calorie/protein needs and prevent further weight loss.  Provide Ensure Enlive po TID with meals, each supplement provides 350 kcal and 20 grams of protein.  Provide multivitamin with minerals daily.  NUTRITION DIAGNOSIS:   Malnutrition (Severe) related to chronic illness, poor appetite (N/V/Diarrhea) as evidenced by energy intake < or equal to 75% for > or equal to 1 month,8.9  percent weight loss over 4 months, severe depletion of muscle mass.  GOAL:   Patient will meet greater than or equal to 90% of their needs  MONITOR:   PO intake, Supplement acceptance, Labs, I & O's, Weight trends  REASON FOR ASSESSMENT:   Malnutrition Screening Tool    ASSESSMENT:   83 year old female with PMHx of HTN, COPD, GERD, GI bleed, MI, CAD, presents with 3 month history of nausea, vomiting, and diarrhea.    -Patient seen by GI. Plan is for patient to undergo colonoscopy with biopsies to rule out microscopic colitis if diarrhea returns (likely will not occur this admission in setting of weakness/hypotension).   Spoke with patient and family members at bedside. Patient has had a poor appetite for the past few months. She reports she has an absence of hunger (anorexia) and early satiety. She endorses N/V and diarrhea for three months. Patient initially reported she was still having diarrhea, but then reported that she is still having frequent bowel movements but they are not liquid stool. Per chart patient no longer having diarrhea. Patient reports the last few months she has only had one small meal per day. She reports making a bowel of shredded wheat in milk, but reports she can typically only have 3-4 pieces of shredded wheat. She reports even at baseline she only has one meal per day  and it is usually 1/2 sandwich.   Patient reports UBW "160 something" and that she has lost 12 lbs in the last 2 weeks. Per chart patient was 168 lbs on 07/21/2016 and has lost 15 lbs (8.9% body weight) over 4 months, which is significant for time frame.   Medications reviewed and include: pantoprazole, potassium chloride 40 mEq BID, NS with KCl 20 mEq/L @ 75 ml/hr.   Labs reviewed: Potassium 2.9, Chloride 114, CO2 15, BUN 34, Creatinine 2.09, elevated Troponin.   Nutrition-Focused physical exam completed. Findings are moderate fat depletion, moderate-severe muscle depletion, and no edema.   Discussed with RN.   Diet Order:  DIET SOFT Room service appropriate? Yes; Fluid consistency: Thin  Skin:  Reviewed, no issues  Last BM:  11/18/2016  Height:   Ht Readings from Last 1 Encounters:  11/17/16 5\' 6"  (1.676 m)    Weight:   Wt Readings from Last 1 Encounters:  11/17/16 153 lb 11.2 oz (69.7 kg)    Ideal Body Weight:  59.1 kg  BMI:  Body mass index is 24.81 kg/m.  Estimated Nutritional Needs:   Kcal:  1415-1650 (MSJ x 1.2-1.4)  Protein:  70-85 grams (1-1.2 grams/kg)  Fluid:  1.7 L/day (25 ml/kg)  EDUCATION NEEDS:   No education needs identified at this time  Willey Blade, MS, RD, LDN Pager: [phone] After Hours Pager: [phone]

## 2016-11-18 NOTE — Progress Notes (Signed)
Critical lab Troponin 0.04.  MD notified.

## 2016-11-18 NOTE — Evaluation (Signed)
Physical Therapy Evaluation Patient Details Name: Kari Mcdonald MRN: [id number] DOB: 07-12-1932 Today's Date: 11/18/2016   History of Present Illness  Pt admitted for acute kidney injury. Pt with history of COPD and GERD. Pt complaints of nausea/vomiting prior to admission. Pt with upward trending K+ this date.   Clinical Impression  Pt is a pleasant 83 year old female who was admitted for acute kidney injury. Pt performs bed mobility/transfers with mod I and ambulation with cga and RW. Pt demonstrates deficits with balance/endurance/mobility. Pt needs RW at this time secondary to unsteadiness and fall history. Would benefit from skilled PT to address above deficits and promote optimal return to PLOF. Pt would benefit from further OP PT to continue higher level balance deficits.      Follow Up Recommendations Outpatient PT    Equipment Recommendations  Rolling walker with 5" wheels    Recommendations for Other Services       Precautions / Restrictions Precautions Precautions: Fall Restrictions Weight Bearing Restrictions: No      Mobility  Bed Mobility Overal bed mobility: Modified Independent             General bed mobility comments: safe technique performed. Pt able to sit at EOB with upright posture  Transfers Overall transfer level: Modified independent Equipment used: None             General transfer comment: upright posture noted with no AD, slight unsteadiness noted.  Ambulation/Gait Ambulation/Gait assistance: Min guard Ambulation Distance (Feet): 200 Feet Assistive device: Rolling walker (2 wheeled) Gait Pattern/deviations: Step-through pattern     General Gait Details: ambulated with RW around RN station with no LOB noted. Safe technique performed. Reciprocal gait pattern performed.  Stairs            Wheelchair Mobility    Modified Rankin (Stroke Patients Only)       Balance Overall balance assessment: History of Falls;Needs  assistance Sitting-balance support: Feet supported Sitting balance-Leahy Scale: Normal     Standing balance support: Bilateral upper extremity supported Standing balance-Leahy Scale: Good                               Pertinent Vitals/Pain Pain Assessment: No/denies pain    Home Living Family/patient expects to be discharged to:: Private residence Living Arrangements:  (lives with grandson) Available Help at Discharge: Family Type of Home: House Home Access: Stairs to enter Entrance Stairs-Rails: Can reach both Technical brewer of Steps: 4 Home Layout: One level Home Equipment: Cane - single point      Prior Function Level of Independence: Independent         Comments: occasionally uses SPC for community distances, does admit to a couple of recent falls     Hand Dominance        Extremity/Trunk Assessment   Upper Extremity Assessment Upper Extremity Assessment: Overall WFL for tasks assessed    Lower Extremity Assessment Lower Extremity Assessment: Overall WFL for tasks assessed       Communication   Communication: No difficulties  Cognition Arousal/Alertness: Awake/alert Behavior During Therapy: WFL for tasks assessed/performed Overall Cognitive Status: Within Functional Limits for tasks assessed                      General Comments      Exercises     Assessment/Plan    PT Assessment Patient needs continued PT services  PT Problem List  Decreased balance;Decreased mobility;Decreased safety awareness          PT Treatment Interventions DME instruction;Gait training;Therapeutic activities;Balance training;Stair training    PT Goals (Current goals can be found in the Care Plan section)  Acute Rehab PT Goals Patient Stated Goal: to go home PT Goal Formulation: With patient Time For Goal Achievement: 12/02/16 Potential to Achieve Goals: Good    Frequency Min 2X/week   Barriers to discharge        Co-evaluation                End of Session Equipment Utilized During Treatment: Gait belt Activity Tolerance: Patient tolerated treatment well Patient left: in chair;with chair alarm set;with family/visitor present Nurse Communication: Mobility status         Time: 1130-1147 PT Time Calculation (min) (ACUTE ONLY): 17 min   Charges:   PT Evaluation $PT Eval Moderate Complexity: 1 Procedure     PT G Codes:        Verniece Encarnacion 11-20-2016, 2:40 PM  Greggory Stallion, PT, DPT [phone]

## 2016-11-19 LAB — CBC
HCT: 34.6 % — ABNORMAL LOW (ref 35.0–47.0)
HEMOGLOBIN: 12 g/dL (ref 12.0–16.0)
MCH: 30.4 pg (ref 26.0–34.0)
MCHC: 34.6 g/dL (ref 32.0–36.0)
MCV: 87.9 fL (ref 80.0–100.0)
Platelets: 201 10*3/uL (ref 150–440)
RBC: 3.94 MIL/uL (ref 3.80–5.20)
RDW: 13.8 % (ref 11.5–14.5)
WBC: 8.3 10*3/uL (ref 3.6–11.0)

## 2016-11-19 LAB — BASIC METABOLIC PANEL
ANION GAP: 4 — AB (ref 5–15)
BUN: 23 mg/dL — ABNORMAL HIGH (ref 6–20)
CHLORIDE: 112 mmol/L — AB (ref 101–111)
CO2: 24 mmol/L (ref 22–32)
Calcium: 8.9 mg/dL (ref 8.9–10.3)
Creatinine, Ser: 1.2 mg/dL — ABNORMAL HIGH (ref 0.44–1.00)
GFR calc Af Amer: 47 mL/min — ABNORMAL LOW (ref >60)
GFR, EST NON AFRICAN AMERICAN: 41 mL/min — AB (ref >60)
Glucose, Bld: 92 mg/dL (ref 65–99)
POTASSIUM: 3.9 mmol/L (ref 3.5–5.1)
SODIUM: 140 mmol/L (ref 135–145)

## 2016-11-19 MED ORDER — ENSURE ENLIVE PO LIQD: 237.0000 mL | Freq: Three times a day (TID) | ORAL | 12 refills | Status: DC

## 2016-11-19 NOTE — Discharge Summary (Signed)
[facility] [place] NAME: Kari Mcdonald    MR#:  [id number]  DATE OF BIRTH:  October 20, 1931  DATE OF ADMISSION:  11/17/2016 ADMITTING PHYSICIAN: Nicholes Mango, MD  DATE OF DISCHARGE: 11/19/2016  PRIMARY CARE PHYSICIAN: FELDPAUSCH, DALE E, MD    ADMISSION DIAGNOSIS:  Hypokalemia [E87.6] Chronic diarrhea [K52.9] AKI (acute kidney injury) ([place]) [N17.9]  DISCHARGE DIAGNOSIS:  Active Problems:   AKI (acute kidney injury) ([place])   Chronic diarrhea   Loss of weight   Intractable cyclical vomiting with nausea   SECONDARY DIAGNOSIS:   Past Medical History:  Diagnosis Date  . Anemia   . Arthritis    left knee  . Cancer ([place])    Skin Cancer  . COPD (chronic obstructive pulmonary disease) ([place])   . Coronary artery disease   . Depression   . Diverticulosis   . Dysphagia   . Dyspnea   . GERD (gastroesophageal reflux disease)   . GI bleed   . Headache    from eye strain  . History of hiatal hernia   . HOH (hard of hearing)   . Hyperlipidemia   . Hypertension   . Myocardial infarction    2006  . Osteoporosis   . Wears dentures    full upper and lower    HOSPITAL COURSE:   83 year old female with a history of COPD and GERD who presents with nausea and vomiting and diarrhea for the past several months and found to have acute kidney injury.  1. Acute kidney injury from dehydration and diarrhea: This improves IV fluids  2. Severe hypotension from dehydration which is improved with IV fluids patient may resume lisinopril however she will not resume triamterene/HCTZ at this time.  If she has elevation in blood pressure then as an outpatient this may be restarted.   3. Chronic vomiting and diarrhea: Patient had no episodes of nausea, vomiting or diarrhea while in the hospital. She was evaluated by GI. Colonoscopy was recommended patient continue to have diarrhea which she did not. She had an EGD in the past which had shown grade B  esophagitis and gastritis. She will continue PPI for this.  4. Hypokalemia due to diarrhea: This was repleted  5. COPD without exacerbation  DISCHARGE CONDITIONS AND DIET:   Stable home  CONSULTS OBTAINED:  Treatment Team:  Lucilla Lame, MD  DRUG ALLERGIES:   Allergies  Allergen Reactions  . Penicillins Rash    DISCHARGE MEDICATIONS:   Current Discharge Medication List    START taking these medications   Details  feeding supplement, ENSURE ENLIVE, (ENSURE ENLIVE) LIQD Take 237 mLs by mouth 3 (three) times daily with meals. Qty: 237 mL, Refills: 12      CONTINUE these medications which have NOT CHANGED   Details  albuterol (PROVENTIL HFA;VENTOLIN HFA) 108 (90 Base) MCG/ACT inhaler Inhale 2 puffs into the lungs every 6 (six) hours as needed for wheezing or shortness of breath.    aspirin EC 81 MG tablet Take 81 mg by mouth daily.    atorvastatin (LIPITOR) 20 MG tablet Take 20 mg by mouth daily.    citalopram (CELEXA) 40 MG tablet Take 40 mg by mouth daily.    dexlansoprazole (DEXILANT) 60 MG capsule Take 60 mg by mouth daily.    enalapril (VASOTEC) 20 MG tablet Take 20 mg by mouth daily.    Lactobacillus (PROBIOTIC ACIDOPHILUS PO) Take 1 tablet by mouth daily.    tiotropium (SPIRIVA) 18 MCG inhalation  capsule Place 18 mcg into inhaler and inhale daily.    traZODone (DESYREL) 100 MG tablet Take 200 mg by mouth at bedtime.      STOP taking these medications     pantoprazole (PROTONIX) 40 MG tablet      triamterene-hydrochlorothiazide (MAXZIDE) 75-50 MG tablet               Today   CHIEF COMPLAINT:   Patient doing well this point. No diarrhea since hospitalization. She thinks she may be intolerant to lactose   VITAL SIGNS:  Blood pressure (!) 123/43, pulse (!) 59, temperature 98.1 F (36.7 C), temperature source Oral, resp. rate 18, height 5\' 6"  (1.676 m), weight 69.7 kg (153 lb 11.2 oz), SpO2 96 %.   REVIEW OF SYSTEMS:  Review of Systems   Constitutional: Negative.  Negative for chills, fever and malaise/fatigue.  HENT: Negative.  Negative for ear discharge, ear pain, hearing loss, nosebleeds and sore throat.   Eyes: Negative.  Negative for blurred vision and pain.  Respiratory: Negative.  Negative for cough, hemoptysis, shortness of breath and wheezing.   Cardiovascular: Negative.  Negative for chest pain, palpitations and leg swelling.  Gastrointestinal: Negative.  Negative for abdominal pain, blood in stool, diarrhea, nausea and vomiting.  Genitourinary: Negative.  Negative for dysuria.  Musculoskeletal: Negative.  Negative for back pain.  Skin: Negative.   Neurological: Negative for dizziness, tremors, speech change, focal weakness, seizures and headaches.  Endo/Heme/Allergies: Negative.  Does not bruise/bleed easily.  Psychiatric/Behavioral: Negative.  Negative for depression, hallucinations and suicidal ideas.     PHYSICAL EXAMINATION:  GENERAL:  83 y.o.-year-old patient lying in the bed with no acute distress.  NECK:  Supple, no jugular venous distention. No thyroid enlargement, no tenderness.  LUNGS: Normal breath sounds bilaterally, no wheezing, rales,rhonchi  No use of accessory muscles of respiration.  CARDIOVASCULAR: S1, S2 normal. No murmurs, rubs, or gallops.  ABDOMEN: Soft, non-tender, non-distended. Bowel sounds present. No organomegaly or mass.  EXTREMITIES: No pedal edema, cyanosis, or clubbing.  PSYCHIATRIC: The patient is alert and oriented x 3.  SKIN: No obvious rash, lesion, or ulcer.   DATA REVIEW:   CBC  Recent Labs Lab 11/19/16 0515  WBC 8.3  HGB 12.0  HCT 34.6*  PLT 201    Chemistries   Recent Labs Lab 11/17/16 1114 11/18/16 0242 11/19/16 0515  NA 135 139 140  K 2.7* 2.9* 3.9  CL 104 114* 112*  CO2 21* 15* 24  GLUCOSE 104* 77 92  BUN 38* 34* 23*  CREATININE 3.08* 2.09* 1.20*  CALCIUM 8.5* 8.4* 8.9  MG 1.9  --   --   AST 18 19  --   ALT 10* 7*  --   ALKPHOS 76 63  --    BILITOT 0.4 0.5  --     Cardiac Enzymes  Recent Labs Lab 11/17/16 1515 11/17/16 2016 11/18/16 0242  TROPONINI 0.04* 0.03* 0.04*    Microbiology Results  @MICRORSLT48 @  RADIOLOGY:  Dg Abd Portable 2 Views  Result Date: 11/17/2016 CLINICAL DATA:  Vomiting and loose stools for 2 months EXAM: PORTABLE ABDOMEN - 2 VIEW COMPARISON:  Abdominal CT 07/11/2009 FINDINGS: The bowel gas pattern is normal. There is no evidence of free air. No radio-opaque calculi or other significant radiographic abnormality is seen. EKG leads create artifact over the abdomen. IMPRESSION: Negative. Electronically Signed   By: Monte Fantasia M.D.   On: 11/17/2016 11:39      Management plans discussed with the  patient and she is in agreement. Stable for discharge home  Patient should follow up with pcp  CODE STATUS:     Code Status Orders        Start     Ordered   11/17/16 1434  Do not attempt resuscitation (DNR)  Continuous    Question Answer Comment  In the event of cardiac or respiratory ARREST Do not call a "code blue"   In the event of cardiac or respiratory ARREST Do not perform Intubation, CPR, defibrillation or ACLS   In the event of cardiac or respiratory ARREST Use medication by any route, position, wound care, and other measures to relive pain and suffering. May use oxygen, suction and manual treatment of airway obstruction as needed for comfort.   Comments RN may pronounce      11/17/16 1433    Code Status History    Date Active Date Inactive Code Status Order ID Comments User Context   This patient has a current code status but no historical code status.    Advance Directive Documentation   Flowsheet Row Most Recent Value  Type of Advance Directive  Healthcare Power of Attorney, Living will  Pre-existing out of facility DNR order (yellow form or pink MOST form)  No data  "MOST" Form in Place?  No data      TOTAL TIME TAKING CARE OF THIS PATIENT: 39 minutes.    Note: This  dictation was prepared with Dragon dictation along with smaller phrase technology. Any transcriptional errors that result from this process are unintentional.  Michele Kerlin M.D on 11/19/2016 at 8:54 AM  Between 7am to 6pm - Pager - [phone] After 6pm go to [email or web address] - password [email or web address] [facility]  Office  [phone]  CC: Primary care physician; [facility], Chrissie Noa, MD

## 2016-11-19 NOTE — Care Management Note (Signed)
Case Management Note  Patient Details  Name: Kari Mcdonald MRN: [id number] Date of Birth: 12/23/1931  Subjective/Objective: Ordered walker from advanced. Spouse updated. He inquired about how to access Meals on Wheels. Provided spouse with [facility] on Wheels number. No other needs identified.                  Action/Plan:   Expected Discharge Date:  11/19/16               Expected Discharge Plan:  Home/Self Care  In-House Referral:     Discharge planning Services  CM Consult  Post Acute Care Choice:  Durable Medical Equipment Choice offered to:  Spouse  DME Arranged:  [facility] rolling DME Agency:  [place]:    [facility] Agency:     Status of Service:  Completed, signed off  If discussed at [place] of Stay Meetings, dates discussed:    Additional Comments:  Jolly Mango, RN 11/19/2016, 12:23 PM

## 2016-11-19 NOTE — Progress Notes (Signed)
Patient and son not wanting to wait on rolling walker to be delivered to room. Patient and son given information on how to contact [place] and Verify insurance Authorization to obtain rolling walker after leaving the hospital should they change their minds. Discharge Instructions reviewed with patient and Daughter-in-law with patient's permission. Patient verbalized understanding. Patient stable, IV dc'ed. Patient transported to private vehicle via wheelchair with daughter-in-law. Son to drive home.

## 2016-11-19 NOTE — Care Management Important Message (Signed)
Important Message  Patient Details  Name: CNIYA FIGGERS MRN: [id number] Date of Birth: June 05, 1932   Medicare Important Message Given:  Yes Copy of initial IM given  Jolly Mango, RN 11/19/2016, 9:05 AM

## 2016-12-18 ENCOUNTER — Encounter: Payer: Self-pay | Admitting: Gastroenterology

## 2016-12-18 ENCOUNTER — Ambulatory Visit (INDEPENDENT_AMBULATORY_CARE_PROVIDER_SITE_OTHER): Payer: Medicare Other | Admitting: Gastroenterology

## 2016-12-18 ENCOUNTER — Other Ambulatory Visit: Payer: Self-pay

## 2016-12-18 VITALS — BP 146/64 | HR 71 | Temp 98.0°F | Ht 66.0 in | Wt 154.0 lb

## 2016-12-18 DIAGNOSIS — R197 Diarrhea, unspecified: Secondary | ICD-10-CM

## 2016-12-18 DIAGNOSIS — M810 Age-related osteoporosis without current pathological fracture: Secondary | ICD-10-CM | POA: Insufficient documentation

## 2016-12-18 DIAGNOSIS — D649 Anemia, unspecified: Secondary | ICD-10-CM | POA: Insufficient documentation

## 2016-12-18 DIAGNOSIS — J449 Chronic obstructive pulmonary disease, unspecified: Secondary | ICD-10-CM | POA: Insufficient documentation

## 2016-12-18 DIAGNOSIS — J302 Other seasonal allergic rhinitis: Secondary | ICD-10-CM | POA: Insufficient documentation

## 2016-12-18 DIAGNOSIS — F329 Major depressive disorder, single episode, unspecified: Secondary | ICD-10-CM | POA: Insufficient documentation

## 2016-12-18 DIAGNOSIS — I2510 Atherosclerotic heart disease of native coronary artery without angina pectoris: Secondary | ICD-10-CM | POA: Insufficient documentation

## 2016-12-18 DIAGNOSIS — M179 Osteoarthritis of knee, unspecified: Secondary | ICD-10-CM | POA: Insufficient documentation

## 2016-12-18 DIAGNOSIS — H9190 Unspecified hearing loss, unspecified ear: Secondary | ICD-10-CM | POA: Insufficient documentation

## 2016-12-18 DIAGNOSIS — I10 Essential (primary) hypertension: Secondary | ICD-10-CM | POA: Insufficient documentation

## 2016-12-18 DIAGNOSIS — F32A Depression, unspecified: Secondary | ICD-10-CM | POA: Insufficient documentation

## 2016-12-18 DIAGNOSIS — C4490 Unspecified malignant neoplasm of skin, unspecified: Secondary | ICD-10-CM | POA: Insufficient documentation

## 2016-12-18 DIAGNOSIS — K219 Gastro-esophageal reflux disease without esophagitis: Secondary | ICD-10-CM | POA: Insufficient documentation

## 2016-12-18 DIAGNOSIS — M1710 Unilateral primary osteoarthritis, unspecified knee: Secondary | ICD-10-CM | POA: Insufficient documentation

## 2016-12-18 DIAGNOSIS — K922 Gastrointestinal hemorrhage, unspecified: Secondary | ICD-10-CM | POA: Insufficient documentation

## 2016-12-18 NOTE — Progress Notes (Signed)
Primary Care Physician: [facility], MD  Primary Gastroenterologist:  Dr. Lucilla Lame  Chief Complaint  Patient presents with  . Diarrhea    HPI: Kari Mcdonald is a 83 y.o. female here follow-up after being discharged from the hospital.  The patient had been admitted with diarrhea.  The patient was told to follow-up as an outpatient for a outpatient colonoscopy to look for microscopic colitis.  The patient states that she is no longer having nausea but still has the diarrhea.  Current Outpatient Prescriptions  Medication Sig Dispense Refill  . albuterol (PROVENTIL HFA;VENTOLIN HFA) 108 (90 Base) MCG/ACT inhaler Inhale 2 puffs into the lungs every 6 (six) hours as needed for wheezing or shortness of breath.    [place] Kitchen aspirin EC 81 MG tablet Take 81 mg by mouth daily.    Marland Kitchen atorvastatin (LIPITOR) 20 MG tablet Take 20 mg by mouth daily.    . citalopram (CELEXA) 40 MG tablet Take 40 mg by mouth daily.    Mar[place] Kitchen dexlansoprazole (DEXILANT) 60 MG capsule Take 60 mg by mouth daily.    . enalapril (VASOTEC) 20 MG tablet Take 20 mg by mouth daily.    . feeding supplement, ENSURE ENLIVE, (ENSURE ENLIVE) LIQD Take 237 mLs by mouth 3 (three) times daily with meals. 237 mL 12  . Lactobacillus (PROBIOTIC ACIDOPHILUS PO) Take 1 tablet by mouth daily.    . pantoprazole (PROTONIX) 40 MG tablet Take by mouth.    . tiotropium (SPIRIVA) 18 MCG inhalation capsule Place 18 mcg into inhaler and inhale daily.    . traZODone (DESYREL) 100 MG tablet Take 200 mg by mouth at bedtime.    . triamterene-hydrochlorothiazide (MAXZIDE) 75-50 MG tablet Take by mouth.     No current facility-administered medications for this visit.     Allergies as of 12/18/2016 - Review Complete 12/18/2016  Allergen Reaction Noted  . Penicillins Rash 08/09/2015    ROS:  General: Negative for anorexia, weight loss, fever, chills, fatigue, weakness. ENT: Negative for hoarseness, difficulty swallowing , nasal congestion. CV:  Negative for chest pain, angina, palpitations, dyspnea on exertion, peripheral edema.  Respiratory: Negative for dyspnea at rest, dyspnea on exertion, cough, sputum, wheezing.  GI: See history of present illness. GU:  Negative for dysuria, hematuria, urinary incontinence, urinary frequency, nocturnal urination.  Endo: Negative for unusual weight change.    Physical Examination:   BP (!) 146/64   Pulse 71   Temp 98 F (36.7 C) (Oral)   Ht 5\' 6"  (1.676 m)   Wt 154 lb (69.9 kg)   BMI 24.86 kg/m   General: Well-nourished, well-developed in no acute distress.  Eyes: No icterus. Conjunctivae pink. Extremities: No lower extremity edema. No clubbing or deformities. Neuro: Alert and oriented x 3.  Grossly intact. Skin: Warm and dry, no jaundice.   Psych: Alert and cooperative, normal mood and affect.  Labs:    Imaging Studies: No results found.  Assessment and Plan:   Kari Mcdonald is a 83 y.o. y/o female who comes in today for follow-up after being discharged from the hospital for diarrhea.  The patient will be set up for a colonoscopy to look for microscopic colitis. I have discussed risks & benefits which include, but are not limited to, bleeding, infection, perforation & drug reaction.  The patient agrees with this plan & written consent will be obtained.       Lucilla Lame, MD. Marval Regal   Note: This dictation was prepared with Dragon dictation  along with smaller phrase technology. Any transcriptional errors that result from this process are unintentional.

## 2016-12-19 ENCOUNTER — Other Ambulatory Visit: Payer: Self-pay

## 2016-12-19 ENCOUNTER — Encounter: Payer: Self-pay | Admitting: *Deleted

## 2016-12-19 MED ORDER — PEG 3350-KCL-NABCB-NACL-NASULF 236 G PO SOLR: ORAL | 0 refills | Status: DC

## 2016-12-19 NOTE — Discharge Instructions (Signed)

## 2016-12-22 ENCOUNTER — Ambulatory Visit
Admission: RE | Admit: 2016-12-22 | Discharge: 2016-12-22 | Disposition: A | Discharge status: Home / Self Care | Payer: Medicare Other | Source: Ambulatory Visit | Attending: Gastroenterology | Admitting: Gastroenterology

## 2016-12-22 ENCOUNTER — Encounter
Admission: RE | Disposition: A | Discharge status: Home / Self Care | Payer: Self-pay | Source: Ambulatory Visit | Attending: Gastroenterology

## 2016-12-22 ENCOUNTER — Ambulatory Visit: Payer: Medicare Other | Admitting: Anesthesiology

## 2016-12-22 DIAGNOSIS — K529 Noninfective gastroenteritis and colitis, unspecified: Secondary | ICD-10-CM | POA: Diagnosis not present

## 2016-12-22 DIAGNOSIS — J449 Chronic obstructive pulmonary disease, unspecified: Secondary | ICD-10-CM | POA: Diagnosis not present

## 2016-12-22 DIAGNOSIS — K5730 Diverticulosis of large intestine without perforation or abscess without bleeding: Secondary | ICD-10-CM | POA: Insufficient documentation

## 2016-12-22 DIAGNOSIS — I252 Old myocardial infarction: Secondary | ICD-10-CM | POA: Diagnosis not present

## 2016-12-22 DIAGNOSIS — Z7982 Long term (current) use of aspirin: Secondary | ICD-10-CM | POA: Insufficient documentation

## 2016-12-22 DIAGNOSIS — E785 Hyperlipidemia, unspecified: Secondary | ICD-10-CM | POA: Diagnosis not present

## 2016-12-22 DIAGNOSIS — F329 Major depressive disorder, single episode, unspecified: Secondary | ICD-10-CM | POA: Insufficient documentation

## 2016-12-22 DIAGNOSIS — R197 Diarrhea, unspecified: Secondary | ICD-10-CM | POA: Diagnosis present

## 2016-12-22 DIAGNOSIS — I2510 Atherosclerotic heart disease of native coronary artery without angina pectoris: Secondary | ICD-10-CM | POA: Insufficient documentation

## 2016-12-22 DIAGNOSIS — F17210 Nicotine dependence, cigarettes, uncomplicated: Secondary | ICD-10-CM | POA: Insufficient documentation

## 2016-12-22 DIAGNOSIS — I10 Essential (primary) hypertension: Secondary | ICD-10-CM | POA: Diagnosis not present

## 2016-12-22 HISTORY — PX: COLONOSCOPY WITH PROPOFOL: SHX5780

## 2016-12-22 SURGERY — COLONOSCOPY WITH PROPOFOL
Anesthesia: Monitor Anesthesia Care | Wound class: Contaminated

## 2016-12-22 MED ORDER — ONDANSETRON HCL 4 MG/2ML IJ SOLN: 4.0000 mg | Freq: Once | INTRAMUSCULAR | Status: DC | PRN

## 2016-12-22 MED ORDER — ACETAMINOPHEN 160 MG/5ML PO SOLN: 325.0000 mg | ORAL | Status: DC | PRN

## 2016-12-22 MED ORDER — PROPOFOL 10 MG/ML IV BOLUS
INTRAVENOUS | Status: DC | PRN
  Administered 2016-12-22 (×4): 10 mg via INTRAVENOUS
  Administered 2016-12-22: 40 mg via INTRAVENOUS
  Administered 2016-12-22 (×11): 10 mg via INTRAVENOUS

## 2016-12-22 MED ORDER — LACTATED RINGERS IV SOLN
INTRAVENOUS | Status: DC
  Administered 2016-12-22 (×2): via INTRAVENOUS

## 2016-12-22 MED ORDER — ACETAMINOPHEN 325 MG PO TABS: 325.0000 mg | ORAL_TABLET | ORAL | Status: DC | PRN

## 2016-12-22 MED ORDER — LIDOCAINE HCL (CARDIAC) 20 MG/ML IV SOLN
INTRAVENOUS | Status: DC | PRN
  Administered 2016-12-22: 20 mg via INTRAVENOUS

## 2016-12-22 MED ORDER — SIMETHICONE 40 MG/0.6ML PO SUSP
ORAL | Status: DC | PRN
  Administered 2016-12-22: 11:00:00

## 2016-12-22 SURGICAL SUPPLY — 23 items

## 2016-12-22 NOTE — H&P (Signed)
Lucilla Lame, MD [facility] [address] [place], [place] [address] Phone:[phone] Fax : [phone]  Primary Care Physician:  [facility], MD Primary Gastroenterologist:  Dr. Allen Norris  Pre-Procedure History & Physical: HPI:  Kari Mcdonald is a 83 y.o. female is here for an colonoscopy.   Past Medical History:  Diagnosis Date  . Anemia   . Arthritis    left knee  . Cancer ([place])    Skin Cancer  . COPD (chronic obstructive pulmonary disease) ([place])   . Coronary artery disease   . Depression   . Diverticulosis   . Dysphagia   . Dyspnea   . GERD (gastroesophageal reflux disease)   . GI bleed   . Headache    from eye strain  . History of hiatal hernia   . HOH (hard of hearing)   . Hyperlipidemia   . Hypertension   . Myocardial infarction    2006  . Osteoporosis   . Wears dentures    full upper and lower    Past Surgical History:  Procedure Laterality Date  . ABDOMINAL HYSTERECTOMY    . APPENDECTOMY    . CATARACT EXTRACTION W/PHACO Left 08/06/2016   Procedure: CATARACT EXTRACTION PHACO AND INTRAOCULAR LENS PLACEMENT (IOC);  Surgeon: Leandrew Koyanagi, MD;  Location: [facility];  Service: Ophthalmology;  Laterality: Left;  . CATARACT EXTRACTION W/PHACO Right 09/10/2016   Procedure: CATARACT EXTRACTION PHACO AND INTRAOCULAR LENS PLACEMENT (IOC);  Surgeon: Leandrew Koyanagi, MD;  Location: [facility];  Service: Ophthalmology;  Laterality: Right;  . CHOLECYSTECTOMY    . COLONOSCOPY    . ESOPHAGOGASTRODUODENOSCOPY    . ESOPHAGOGASTRODUODENOSCOPY (EGD) WITH PROPOFOL N/A 07/21/2016   Procedure: ESOPHAGOGASTRODUODENOSCOPY (EGD) WITH PROPOFOL;  Surgeon: Lollie Sails, MD;  [facility] END[facility]COPY;  Service: Endoscopy;  Laterality: N/A;  . GANGLION CYST EXCISION Left    wrist (x2)  . PILONIDAL CYST EXCISION      Prior to Admission medications   Medication Sig Start Date End Date Taking? Authorizing Provider  albuterol (PROVENTIL  HFA;VENTOLIN HFA) 108 (90 Base) MCG/ACT inhaler Inhale 2 puffs into the lungs every 6 (six) hours as needed for wheezing or shortness of breath.   Yes Historical Provider, MD  aspirin EC 81 MG tablet Take 81 mg by mouth daily.   Yes Historical Provider, MD  atorvastatin (LIPITOR) 20 MG tablet Take 20 mg by mouth daily.   Yes Historical Provider, MD  citalopram (CELEXA) 40 MG tablet Take 40 mg by mouth daily.   Yes Historical Provider, MD  dexlansoprazole (DEXILANT) 60 MG capsule Take 60 mg by mouth daily.   Yes Historical Provider, MD  enalapril (VASOTEC) 20 MG tablet Take 20 mg by mouth daily. 11/13/16  Yes Historical Provider, MD  feeding supplement, ENSURE ENLIVE, (ENSURE ENLIVE) LIQD Take 237 mLs by mouth 3 (three) times daily with meals. 11/19/16  Yes Bettey Costa, MD  Lactobacillus (PROBIOTIC ACIDOPHILUS PO) Take 1 tablet by mouth daily.   Yes Historical Provider, MD  tiotropium (SPIRIVA) 18 MCG inhalation capsule Place 18 mcg into inhaler and inhale daily.   Yes Historical Provider, MD  traZODone (DESYREL) 100 MG tablet Take 200 mg by mouth at bedtime.   Yes Historical Provider, MD  polyethylene glycol (GOLYTELY) 236 g solution Drink one 8 oz glass every 20 mins until stools are clear. 12/19/16   Lucilla Lame, MD  triamterene-hydrochlorothiazide [facility]) 75-50 MG tablet Take by mouth. 04/28/16   Historical Provider, MD    Allergies as of  12/18/2016 - Review Complete 12/18/2016  Allergen Reaction Noted  . Penicillins Rash 08/09/2015    History reviewed. No pertinent family history.  Social History   Social History  . Marital status: Widowed    Spouse name: N/A  . Number of children: N/A  . Years of education: N/A   Occupational History  . Not on file.   Social History Main Topics  . Smoking status: Current Every Day Smoker    Packs/day: 0.50    Years: 60.00  . Smokeless tobacco: Never Used  . Alcohol use No  . Drug use: No  . Sexual activity: Not on file   Other Topics Concern   . Not on file   Social History Narrative  . No narrative on file    Review of Systems: See HPI, otherwise negative ROS  Physical Exam: There were no vitals taken for this visit. General:   Alert,  pleasant and cooperative in NAD Head:  Normocephalic and atraumatic. Neck:  Supple; no masses or thyromegaly. Lungs:  Clear throughout to auscultation.    Heart:  Regular rate and rhythm. Abdomen:  Soft, nontender and nondistended. Normal bowel sounds, without guarding, and without rebound.   Neurologic:  Alert and  oriented x4;  grossly normal neurologically.  Impression/Plan: Kari Mcdonald is here for an colonoscopy to be performed for diarrhea  Risks, benefits, limitations, and alternatives regarding  colonoscopy have been reviewed with the patient.  Questions have been answered.  All parties agreeable.   Lucilla Lame, MD  12/22/2016, 10:47 AM

## 2016-12-22 NOTE — Transfer of Care (Signed)
Immediate Anesthesia Transfer of Care Note  Patient: Kari Mcdonald  Procedure(s) Performed: Procedure(s): COLONOSCOPY WITH PROPOFOL (N/A)  Patient Location: PACU  Anesthesia Type: MAC  Level of Consciousness: awake, alert  and patient cooperative  Airway and Oxygen Therapy: Patient Spontanous Breathing and Patient connected to supplemental oxygen  Post-op Assessment: Post-op Vital signs reviewed, Patient's Cardiovascular Status Stable, Respiratory Function Stable, Patent Airway and No signs of Nausea or vomiting  Post-op Vital Signs: Reviewed and stable  Complications: No apparent anesthesia complications

## 2016-12-22 NOTE — Anesthesia Postprocedure Evaluation (Signed)
Anesthesia Post Note  Patient: Kari Mcdonald  Procedure(s) Performed: Procedure(s) (LRB): COLONOSCOPY WITH PROPOFOL (N/A)  Patient location during evaluation: PACU Anesthesia Type: MAC Level of consciousness: awake and alert and oriented Pain management: pain level controlled Vital Signs Assessment: post-procedure vital signs reviewed and stable Respiratory status: spontaneous breathing and nonlabored ventilation Cardiovascular status: stable Postop Assessment: no signs of nausea or vomiting and adequate PO intake Anesthetic complications: no    Estill Batten

## 2016-12-22 NOTE — Op Note (Signed)
[facility]astroenterology Patient Name: Kari Mcdonald Procedure Date: 12/22/2016 11:16 AM MRN: [id number] Account #: [id number] Date of Birth: 1931/12/22 Admit Type: Outpatient Age: 83 Room: [facility] OR ROOM 01 Gender: Female Note Status: Finalized Procedure:            Colonoscopy Indications:          Chronic diarrhea Providers:            Lucilla Lame MD, MD Referring MD:         Sofie Hartigan (Referring MD) Medicines:            Propofol per Anesthesia Complications:        No immediate complications. Procedure:            Pre-Anesthesia Assessment:                       - Prior to the procedure, a History and Physical was                        performed, and patient medications and allergies were                        reviewed. The patient's tolerance of previous                        anesthesia was also reviewed. The risks and benefits of                        the procedure and the sedation options and risks were                        discussed with the patient. All questions were                        answered, and informed consent was obtained. Prior                        Anticoagulants: The patient has taken no previous                        anticoagulant or antiplatelet agents. ASA Grade                        Assessment: II - A patient with mild systemic disease.                        After reviewing the risks and benefits, the patient was                        deemed in satisfactory condition to undergo the                        procedure.                       After obtaining informed consent, the colonoscope was                        passed under direct vision. Throughout the procedure,  the patient's blood pressure, pulse, and oxygen                        saturations were monitored continuously. The [place] [id number]) was introduced through the   anus and advanced to the the terminal ileum. The                        colonoscopy was performed without difficulty. The                        patient tolerated the procedure well. The quality of                        the bowel preparation was excellent. Findings:      The perianal and digital rectal examinations were normal.      The terminal ileum appeared normal. Biopsies were taken with a cold       forceps for histology.      Multiple small-mouthed diverticula were found in the sigmoid colon.      Random biopsies were obtained with cold forceps for histology randomly       in the entire colon. Impression:           - The examined portion of the ileum was normal.                        Biopsied.                       - Diverticulosis in the sigmoid colon.                       - Random biopsies were obtained in the entire colon. Recommendation:       - Discharge patient to home.                       - Resume previous diet.                       - Continue present medications. Procedure Code(s):    --- Professional ---                       [phone], Colonoscopy, flexible; with biopsy, single or                        multiple Diagnosis Code(s):    --- Professional ---                       K57.30, Diverticulosis of large intestine without                        perforation or abscess without bleeding                       K52.9, Noninfective gastroenteritis and colitis,                        unspecified CPT copyright 2016 American Medical Association. All rights reserved. The codes documented in this  report are preliminary and upon coder review may  be revised to meet current compliance requirements. Lucilla Lame MD, MD 12/22/2016 11:44:38 AM This report has been signed electronically. Number of Addenda: 0 Note Initiated On: 12/22/2016 11:16 AM Scope Withdrawal Time: 0 hours 4 minutes 33 seconds  Total Procedure Duration: 0 hours 14 minutes 22 seconds       [facility]

## 2016-12-22 NOTE — Anesthesia Preprocedure Evaluation (Signed)
Anesthesia Evaluation  Patient identified by MRN, date of birth, ID band  Reviewed: Allergy & Precautions, NPO status , Patient's Chart, lab work & pertinent test results  Airway Mallampati: I  TM Distance: >3 FB Neck ROM: Full    Dental  (+) Upper Dentures, Lower Dentures   Pulmonary shortness of breath, COPD,  COPD inhaler, Current Smoker,    Pulmonary exam normal        Cardiovascular hypertension, + CAD and + Past MI  Normal cardiovascular exam     Neuro/Psych PSYCHIATRIC DISORDERS Depression    GI/Hepatic Neg liver ROS, hiatal hernia, GERD  ,  Endo/Other    Renal/GU negative Renal ROS     Musculoskeletal  (+) Arthritis , Osteoarthritis,    Abdominal   Peds  Hematology  (+) anemia ,   Anesthesia Other Findings   Reproductive/Obstetrics                             Anesthesia Physical Anesthesia Plan  ASA: III  Anesthesia Plan: MAC   Post-op Pain Management:    Induction:   Airway Management Planned:   Additional Equipment:   Intra-op Plan:   Post-operative Plan:   Informed Consent: I have reviewed the patients History and Physical, chart, labs and discussed the procedure including the risks, benefits and alternatives for the proposed anesthesia with the patient or authorized representative who has indicated his/her understanding and acceptance.     Plan Discussed with: CRNA  Anesthesia Plan Comments:         Anesthesia Quick Evaluation

## 2016-12-23 ENCOUNTER — Encounter: Payer: Self-pay | Admitting: Gastroenterology

## 2016-12-24 ENCOUNTER — Encounter: Payer: Self-pay | Admitting: Gastroenterology

## 2017-01-09 ENCOUNTER — Telehealth: Payer: Self-pay

## 2017-01-09 NOTE — Telephone Encounter (Signed)
-----   Message from Lucilla Lame, MD sent at 12/29/2016  8:13 AM EDT ----- Please have the patient come in for a follow up.

## 2017-01-09 NOTE — Telephone Encounter (Signed)
Pt scheduled for a follow up appt with Dr. Allen Norris on 01/19/17 to discuss colonoscopy results.

## 2017-01-19 ENCOUNTER — Ambulatory Visit (INDEPENDENT_AMBULATORY_CARE_PROVIDER_SITE_OTHER): Payer: Medicare Other | Admitting: Gastroenterology

## 2017-01-19 ENCOUNTER — Encounter: Payer: Self-pay | Admitting: Gastroenterology

## 2017-01-19 VITALS — BP 142/73 | HR 65 | Temp 98.3°F | Ht 66.0 in | Wt 153.0 lb

## 2017-01-19 DIAGNOSIS — K52831 Collagenous colitis: Secondary | ICD-10-CM

## 2017-01-19 MED ORDER — BUDESONIDE 3 MG PO CPEP: 9.0000 mg | ORAL_CAPSULE | ORAL | 1 refills | Status: DC

## 2017-01-19 NOTE — Progress Notes (Signed)
Primary Care Physician: [facility], MD  Primary Gastroenterologist:  Dr. Lucilla Lame  No chief complaint on file.   HPI: Kari Mcdonald is a 83 y.o. female here for follow-up after colonoscopy. The patient had a colonoscopy due to diarrhea. The patient had a colonoscopy with normal-appearing mucosa. The biopsies of the colon showed her to have microscopic colitis. The biopsies were consistent with collagenous colitis.  Current Outpatient Prescriptions  Medication Sig Dispense Refill  . albuterol (PROVENTIL HFA;VENTOLIN HFA) 108 (90 Base) MCG/ACT inhaler Inhale 2 puffs into the lungs every 6 (six) hours as needed for wheezing or shortness of breath.    Marland Kitchen aspirin EC 81 MG tablet Take 81 mg by mouth daily.    Marland Kitchen atorvastatin (LIPITOR) 20 MG tablet Take 20 mg by mouth daily.    . citalopram (CELEXA) 40 MG tablet Take 40 mg by mouth daily.    Marland Kitchen dexlansoprazole (DEXILANT) 60 MG capsule Take 60 mg by mouth daily.    . enalapril (VASOTEC) 20 MG tablet Take 20 mg by mouth daily.    . feeding supplement, ENSURE ENLIVE, (ENSURE ENLIVE) LIQD Take 237 mLs by mouth 3 (three) times daily with meals. 237 mL 12  . Lactobacillus (PROBIOTIC ACIDOPHILUS PO) Take 1 tablet by mouth daily.    . polyethylene glycol (GOLYTELY) 236 g solution Drink one 8 oz glass every 20 mins until stools are clear. 4000 mL 0  . tiotropium (SPIRIVA) 18 MCG inhalation capsule Place 18 mcg into inhaler and inhale daily.    . traZODone (DESYREL) 100 MG tablet Take 200 mg by mouth at bedtime.    . triamterene-hydrochlorothiazide (MAXZIDE) 75-50 MG tablet Take by mouth.     No current facility-administered medications for this visit.     Allergies as of 01/19/2017 - Review Complete 12/22/2016  Allergen Reaction Noted  . Penicillins Rash 08/09/2015    ROS:  General: Negative for anorexia, weight loss, fever, chills, fatigue, weakness. ENT: Negative for hoarseness, difficulty swallowing , nasal congestion. CV:  Negative for chest pain, angina, palpitations, dyspnea on exertion, peripheral edema.  Respiratory: Negative for dyspnea at rest, dyspnea on exertion, cough, sputum, wheezing.  GI: See history of present illness. GU:  Negative for dysuria, hematuria, urinary incontinence, urinary frequency, nocturnal urination.  Endo: Negative for unusual weight change.    Physical Examination:   There were no vitals taken for this visit.  General: Well-nourished, well-developed in no acute distress.  Eyes: No icterus. Conjunctivae pink. Mouth: Oropharyngeal mucosa moist and pink , no lesions erythema or exudate. Lungs: Clear to auscultation bilaterally. Non-labored. Heart: Regular rate and rhythm, no murmurs rubs or gallops.  Abdomen: Bowel sounds are normal, nontender, nondistended, no hepatosplenomegaly or masses, no abdominal bruits or hernia , no rebound or guarding.   Extremities: No lower extremity edema. No clubbing or deformities. Neuro: Alert and oriented x 3.  Grossly intact. Skin: Warm and dry, no jaundice.   Psych: Alert and cooperative, normal mood and affect.  Labs:    Imaging Studies: No results found.  Assessment and Plan:   Kari Mcdonald is a 83 y.o. y/o female who was found to have collagenous colitis on her colonoscopy. The patient will be treated with budesonide. The patient has also been told to take Imodium as needed. The patient has been explained the pathophysiology of collagenous colitis. She will contact me if her symptoms do not improve.    Lucilla Lame, MD. Marval Regal   Note: This dictation was prepared  with Dragon dictation along with smaller phrase technology. Any transcriptional errors that result from this process are unintentional.

## 2017-01-25 IMAGING — CR DG CHEST 2V
2 series · 2 of 2 positions shown · non-contrast
Comparison: 11/27/2013

CLINICAL DATA: Productive cough

EXAM:
CHEST  2 VIEW

[chest pa]
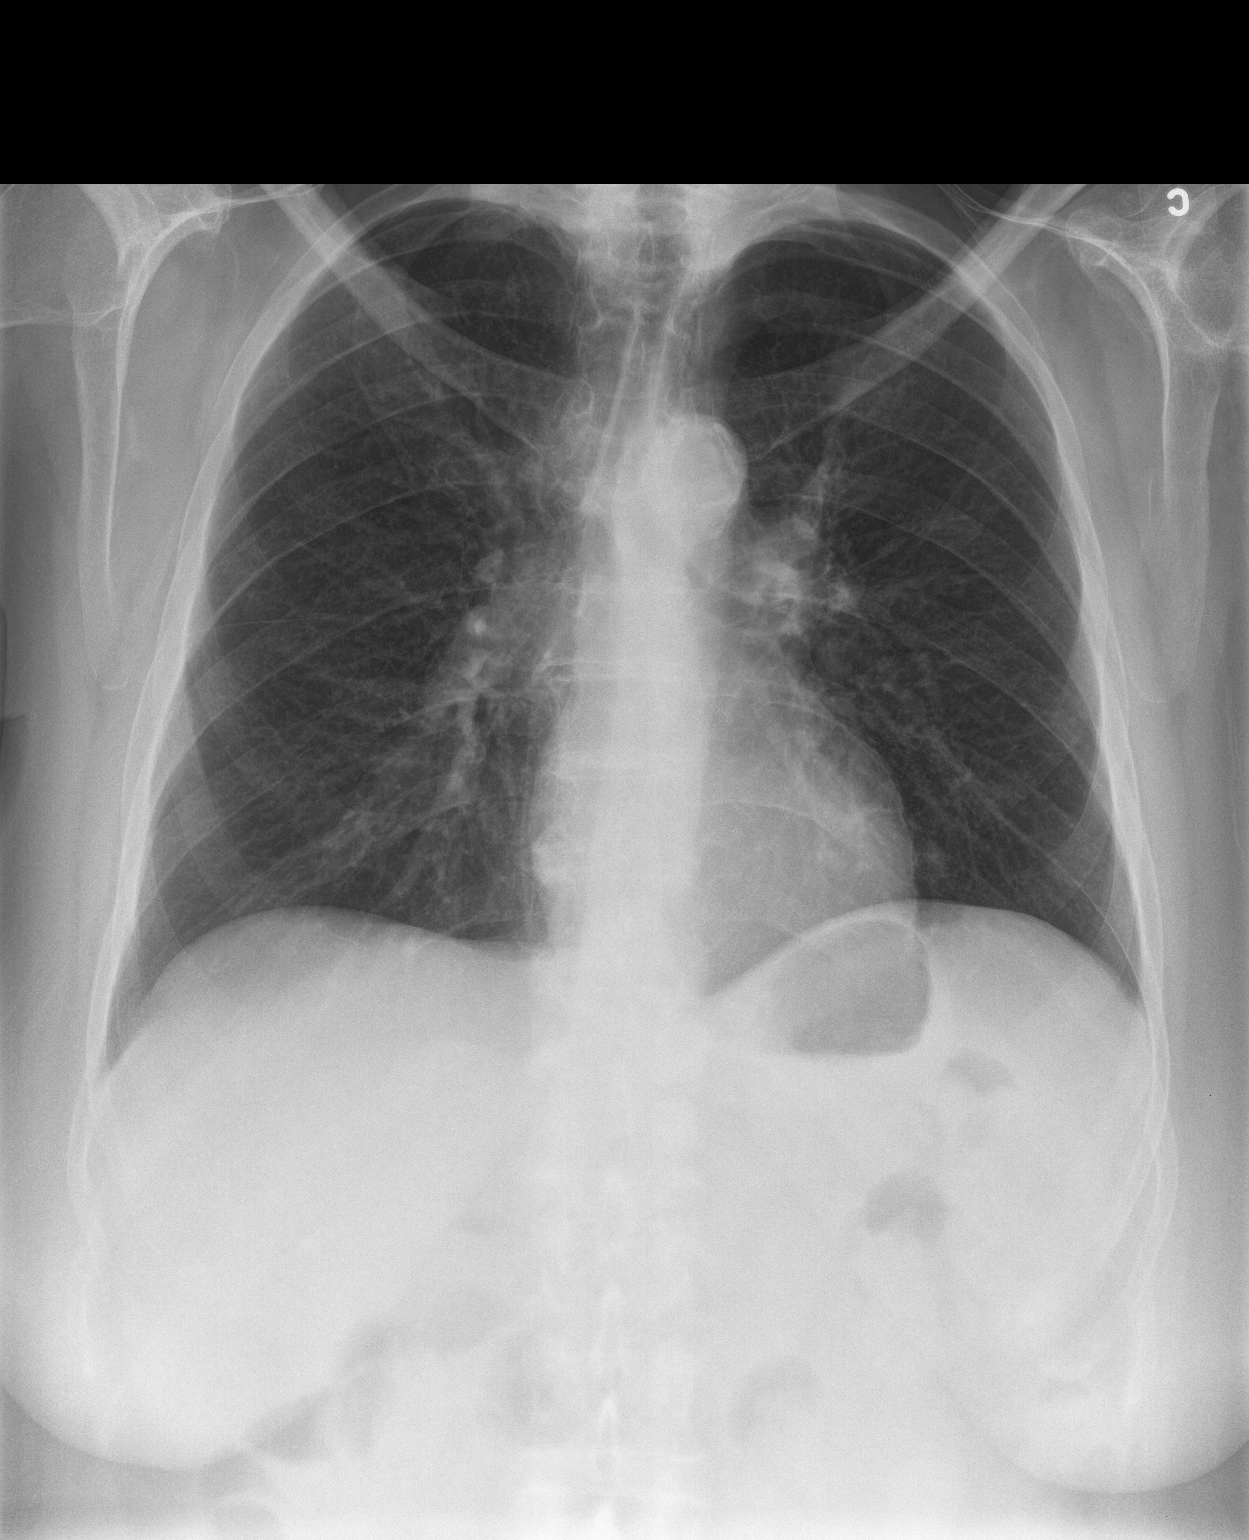

[chest lat]
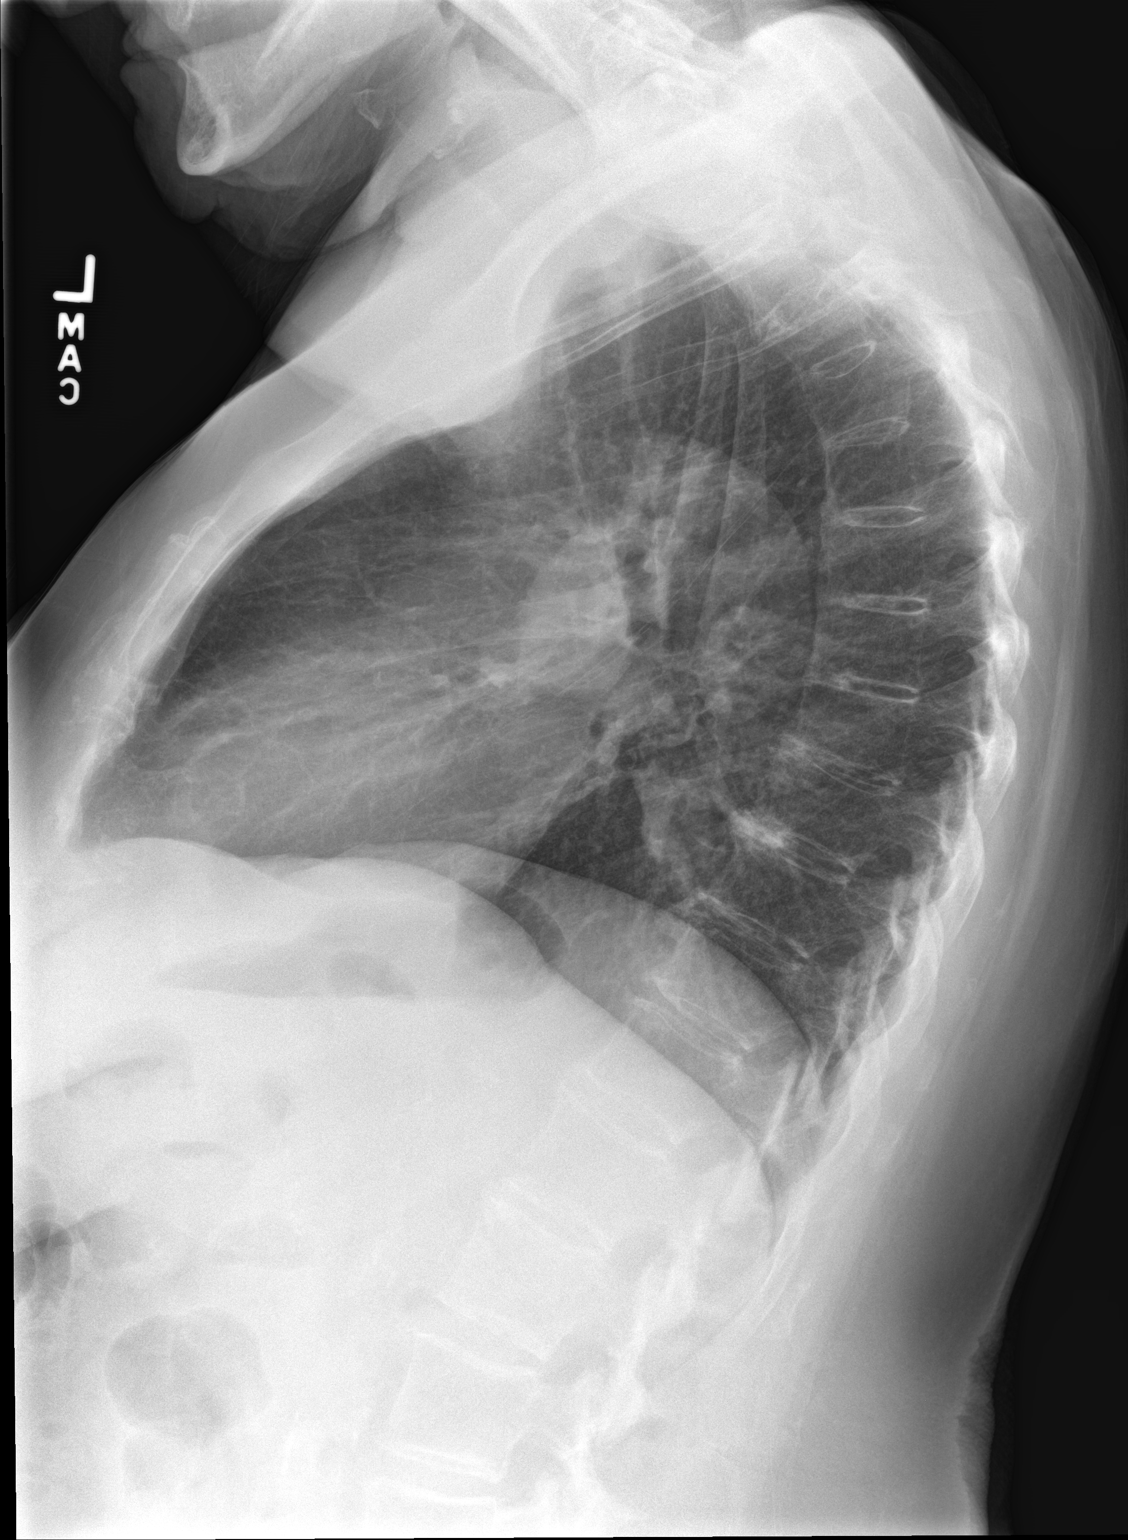

[2 of 2 positions shown; findings below may reference images not displayed]

FINDINGS: Heart size and vascularity normal. Atherosclerotic aortic arch
without aneurysm. Lungs are clear. No infiltrate, effusion, or mass
lesion. No change from the prior study.
IMPRESSION: No active cardiopulmonary disease.

## 2017-06-01 ENCOUNTER — Inpatient Hospital Stay: Payer: Medicare Other | Admitting: Anesthesiology

## 2017-06-01 ENCOUNTER — Encounter: Payer: Self-pay | Admitting: Intensive Care

## 2017-06-01 ENCOUNTER — Encounter
Admission: EM | Disposition: A | Discharge status: Skilled Nursing Facility | Payer: Self-pay | Source: Home / Self Care | Attending: Internal Medicine

## 2017-06-01 ENCOUNTER — Emergency Department: Payer: Medicare Other

## 2017-06-01 ENCOUNTER — Inpatient Hospital Stay: Payer: Medicare Other

## 2017-06-01 ENCOUNTER — Inpatient Hospital Stay
Admission: EM | Admit: 2017-06-01 | Discharge: 2017-06-04 | DRG: 481 | Disposition: A | Discharge status: Skilled Nursing Facility | Payer: Medicare Other | Attending: Internal Medicine | Admitting: Internal Medicine

## 2017-06-01 DIAGNOSIS — I129 Hypertensive chronic kidney disease with stage 1 through stage 4 chronic kidney disease, or unspecified chronic kidney disease: Secondary | ICD-10-CM | POA: Diagnosis present

## 2017-06-01 DIAGNOSIS — Z85828 Personal history of other malignant neoplasm of skin: Secondary | ICD-10-CM

## 2017-06-01 DIAGNOSIS — J449 Chronic obstructive pulmonary disease, unspecified: Secondary | ICD-10-CM | POA: Diagnosis present

## 2017-06-01 DIAGNOSIS — N179 Acute kidney failure, unspecified: Secondary | ICD-10-CM | POA: Diagnosis not present

## 2017-06-01 DIAGNOSIS — I2510 Atherosclerotic heart disease of native coronary artery without angina pectoris: Secondary | ICD-10-CM | POA: Diagnosis present

## 2017-06-01 DIAGNOSIS — D62 Acute posthemorrhagic anemia: Secondary | ICD-10-CM | POA: Diagnosis not present

## 2017-06-01 DIAGNOSIS — D72829 Elevated white blood cell count, unspecified: Secondary | ICD-10-CM | POA: Diagnosis not present

## 2017-06-01 DIAGNOSIS — N183 Chronic kidney disease, stage 3 (moderate): Secondary | ICD-10-CM | POA: Diagnosis present

## 2017-06-01 DIAGNOSIS — K449 Diaphragmatic hernia without obstruction or gangrene: Secondary | ICD-10-CM | POA: Diagnosis present

## 2017-06-01 DIAGNOSIS — M810 Age-related osteoporosis without current pathological fracture: Secondary | ICD-10-CM | POA: Diagnosis present

## 2017-06-01 DIAGNOSIS — H9190 Unspecified hearing loss, unspecified ear: Secondary | ICD-10-CM | POA: Diagnosis present

## 2017-06-01 DIAGNOSIS — Z66 Do not resuscitate: Secondary | ICD-10-CM | POA: Diagnosis present

## 2017-06-01 DIAGNOSIS — F17200 Nicotine dependence, unspecified, uncomplicated: Secondary | ICD-10-CM | POA: Diagnosis present

## 2017-06-01 DIAGNOSIS — Z880 Allergy status to penicillin: Secondary | ICD-10-CM

## 2017-06-01 DIAGNOSIS — Z90710 Acquired absence of both cervix and uterus: Secondary | ICD-10-CM

## 2017-06-01 DIAGNOSIS — M1712 Unilateral primary osteoarthritis, left knee: Secondary | ICD-10-CM | POA: Diagnosis present

## 2017-06-01 DIAGNOSIS — Z419 Encounter for procedure for purposes other than remedying health state, unspecified: Secondary | ICD-10-CM

## 2017-06-01 DIAGNOSIS — F419 Anxiety disorder, unspecified: Secondary | ICD-10-CM | POA: Diagnosis present

## 2017-06-01 DIAGNOSIS — S72002A Fracture of unspecified part of neck of left femur, initial encounter for closed fracture: Secondary | ICD-10-CM | POA: Diagnosis present

## 2017-06-01 DIAGNOSIS — W010XXA Fall on same level from slipping, tripping and stumbling without subsequent striking against object, initial encounter: Secondary | ICD-10-CM | POA: Diagnosis present

## 2017-06-01 DIAGNOSIS — S72142A Displaced intertrochanteric fracture of left femur, initial encounter for closed fracture: Principal | ICD-10-CM | POA: Diagnosis present

## 2017-06-01 DIAGNOSIS — I252 Old myocardial infarction: Secondary | ICD-10-CM | POA: Diagnosis not present

## 2017-06-01 DIAGNOSIS — Z8249 Family history of ischemic heart disease and other diseases of the circulatory system: Secondary | ICD-10-CM | POA: Diagnosis not present

## 2017-06-01 DIAGNOSIS — Z972 Presence of dental prosthetic device (complete) (partial): Secondary | ICD-10-CM

## 2017-06-01 DIAGNOSIS — E782 Mixed hyperlipidemia: Secondary | ICD-10-CM | POA: Diagnosis present

## 2017-06-01 DIAGNOSIS — R0600 Dyspnea, unspecified: Secondary | ICD-10-CM

## 2017-06-01 HISTORY — PX: INTRAMEDULLARY (IM) NAIL INTERTROCHANTERIC: SHX5875

## 2017-06-01 LAB — CBC WITH DIFFERENTIAL/PLATELET
BASOS PCT: 0 %
Basophils Absolute: 0.1 10*3/uL (ref 0–0.1)
EOS ABS: 0.2 10*3/uL (ref 0–0.7)
Eosinophils Relative: 1 %
HEMATOCRIT: 40.7 % (ref 35.0–47.0)
Hemoglobin: 13.5 g/dL (ref 12.0–16.0)
LYMPHS ABS: 1.6 10*3/uL (ref 1.0–3.6)
Lymphocytes Relative: 12 %
MCH: 28.1 pg (ref 26.0–34.0)
MCHC: 33.3 g/dL (ref 32.0–36.0)
MCV: 84.6 fL (ref 80.0–100.0)
MONOS PCT: 4 %
Monocytes Absolute: 0.6 10*3/uL (ref 0.2–0.9)
NEUTROS ABS: 11.8 10*3/uL — AB (ref 1.4–6.5)
Neutrophils Relative %: 83 %
Platelets: 237 10*3/uL (ref 150–440)
RBC: 4.81 MIL/uL (ref 3.80–5.20)
RDW: 14.9 % — ABNORMAL HIGH (ref 11.5–14.5)
WBC: 14.2 10*3/uL — AB (ref 3.6–11.0)

## 2017-06-01 LAB — COMPREHENSIVE METABOLIC PANEL
ALBUMIN: 3.8 g/dL (ref 3.5–5.0)
ALT: 14 U/L (ref 14–54)
ANION GAP: 9 (ref 5–15)
AST: 24 U/L (ref 15–41)
Alkaline Phosphatase: 69 U/L (ref 38–126)
BUN: 19 mg/dL (ref 6–20)
CHLORIDE: 102 mmol/L (ref 101–111)
CO2: 27 mmol/L (ref 22–32)
Calcium: 9.7 mg/dL (ref 8.9–10.3)
Creatinine, Ser: 1.19 mg/dL — ABNORMAL HIGH (ref 0.44–1.00)
GFR calc non Af Amer: 41 mL/min — ABNORMAL LOW (ref >60)
GFR, EST AFRICAN AMERICAN: 48 mL/min — AB (ref >60)
Glucose, Bld: 126 mg/dL — ABNORMAL HIGH (ref 65–99)
Potassium: 4.2 mmol/L (ref 3.5–5.1)
SODIUM: 138 mmol/L (ref 135–145)
Total Bilirubin: 0.8 mg/dL (ref 0.3–1.2)
Total Protein: 6.4 g/dL — ABNORMAL LOW (ref 6.5–8.1)

## 2017-06-01 LAB — HEMOGLOBIN AND HEMATOCRIT, BLOOD
HEMATOCRIT: 33 % — AB (ref 35.0–47.0)
HEMOGLOBIN: 10.6 g/dL — AB (ref 12.0–16.0)

## 2017-06-01 LAB — SURGICAL PCR SCREEN
MRSA, PCR: NEGATIVE
STAPHYLOCOCCUS AUREUS: NEGATIVE

## 2017-06-01 SURGERY — FIXATION, FRACTURE, INTERTROCHANTERIC, WITH INTRAMEDULLARY ROD
Anesthesia: Spinal | Site: Hip | Laterality: Left | Wound class: Clean

## 2017-06-01 MED ORDER — ZOLPIDEM TARTRATE 5 MG PO TABS: 5.0000 mg | ORAL_TABLET | Freq: Every evening | ORAL | Status: DC | PRN

## 2017-06-01 MED ORDER — FENTANYL CITRATE (PF) 100 MCG/2ML IJ SOLN
25.0000 ug | Freq: Once | INTRAMUSCULAR | Status: AC
  Administered 2017-06-01: 25 ug via INTRAVENOUS

## 2017-06-01 MED ORDER — DOCUSATE SODIUM 100 MG PO CAPS: 100.0000 mg | ORAL_CAPSULE | Freq: Two times a day (BID) | ORAL | Status: DC

## 2017-06-01 MED ORDER — SODIUM CHLORIDE 0.9 % IV BOLUS (SEPSIS)
1000.0000 mL | Freq: Once | INTRAVENOUS | Status: AC
  Administered 2017-06-01: 1000 mL via INTRAVENOUS

## 2017-06-01 MED ORDER — BUPIVACAINE HCL (PF) 0.5 % IJ SOLN
INTRAMUSCULAR | Status: DC | PRN
  Administered 2017-06-01: 2.5 mL

## 2017-06-01 MED ORDER — MENTHOL 3 MG MT LOZG
1.0000 | LOZENGE | OROMUCOSAL | Status: DC | PRN
  Filled 2017-06-01: qty 9

## 2017-06-01 MED ORDER — BISACODYL 10 MG RE SUPP: 10.0000 mg | Freq: Every day | RECTAL | Status: DC | PRN

## 2017-06-01 MED ORDER — SODIUM CHLORIDE 0.9 % IV SOLN: INTRAVENOUS | Status: DC | PRN

## 2017-06-01 MED ORDER — HYDROCODONE-ACETAMINOPHEN 5-325 MG PO TABS
1.0000 | ORAL_TABLET | Freq: Four times a day (QID) | ORAL | Status: DC | PRN
  Administered 2017-06-02 (×2): 1 via ORAL
  Administered 2017-06-02: 2 via ORAL
  Administered 2017-06-03: 1 via ORAL
  Administered 2017-06-03 – 2017-06-04 (×2): 2 via ORAL
  Filled 2017-06-01 (×2): qty 2
  Filled 2017-06-01 (×2): qty 1
  Filled 2017-06-01: qty 2
  Filled 2017-06-01: qty 1

## 2017-06-01 MED ORDER — ACETAMINOPHEN 650 MG RE SUPP: 650.0000 mg | Freq: Four times a day (QID) | RECTAL | Status: DC | PRN

## 2017-06-01 MED ORDER — OXYCODONE HCL 5 MG PO TABS
5.0000 mg | ORAL_TABLET | ORAL | Status: DC | PRN
  Administered 2017-06-03: 5 mg via ORAL
  Filled 2017-06-01: qty 1

## 2017-06-01 MED ORDER — CLINDAMYCIN PHOSPHATE 600 MG/50ML IV SOLN
INTRAVENOUS | Status: AC
  Filled 2017-06-01: qty 50

## 2017-06-01 MED ORDER — SODIUM CHLORIDE 0.9 % IV SOLN
INTRAVENOUS | Status: DC | PRN
  Administered 2017-06-01: 50 ug/min via INTRAVENOUS

## 2017-06-01 MED ORDER — LORAZEPAM 0.5 MG PO TABS: 0.5000 mg | ORAL_TABLET | Freq: Four times a day (QID) | ORAL | Status: DC | PRN

## 2017-06-01 MED ORDER — SODIUM CHLORIDE 0.9 % IV SOLN
INTRAVENOUS | Status: DC
  Administered 2017-06-01: 22:00:00 via INTRAVENOUS

## 2017-06-01 MED ORDER — DOCUSATE SODIUM 100 MG PO CAPS
100.0000 mg | ORAL_CAPSULE | Freq: Two times a day (BID) | ORAL | Status: DC
  Administered 2017-06-01 – 2017-06-04 (×6): 100 mg via ORAL
  Filled 2017-06-01 (×6): qty 1

## 2017-06-01 MED ORDER — ONDANSETRON HCL 4 MG/2ML IJ SOLN
INTRAMUSCULAR | Status: AC
  Filled 2017-06-01: qty 2

## 2017-06-01 MED ORDER — CLINDAMYCIN PHOSPHATE 600 MG/50ML IV SOLN: 600.0000 mg | Freq: Four times a day (QID) | INTRAVENOUS | Status: AC

## 2017-06-01 MED ORDER — FENTANYL CITRATE (PF) 100 MCG/2ML IJ SOLN
50.0000 ug | INTRAMUSCULAR | Status: DC | PRN
  Administered 2017-06-01: 50 ug via INTRAVENOUS
  Filled 2017-06-01 (×2): qty 2

## 2017-06-01 MED ORDER — SODIUM CHLORIDE 0.9 % IJ SOLN
INTRAMUSCULAR | Status: AC
  Filled 2017-06-01: qty 10

## 2017-06-01 MED ORDER — MAGNESIUM CITRATE PO SOLN
1.0000 | Freq: Once | ORAL | Status: DC | PRN
  Filled 2017-06-01: qty 296

## 2017-06-01 MED ORDER — PHENYLEPHRINE HCL 10 MG/ML IJ SOLN
INTRAMUSCULAR | Status: DC | PRN
  Administered 2017-06-01 (×4): 100 ug via INTRAVENOUS

## 2017-06-01 MED ORDER — FAMOTIDINE IN NACL 20-0.9 MG/50ML-% IV SOLN
20.0000 mg | INTRAVENOUS | Status: DC
  Administered 2017-06-02 – 2017-06-03 (×3): 20 mg via INTRAVENOUS
  Filled 2017-06-01 (×4): qty 50

## 2017-06-01 MED ORDER — MIDAZOLAM HCL 2 MG/2ML IJ SOLN
INTRAMUSCULAR | Status: AC
  Filled 2017-06-01: qty 2

## 2017-06-01 MED ORDER — ACETAMINOPHEN 325 MG PO TABS
650.0000 mg | ORAL_TABLET | Freq: Four times a day (QID) | ORAL | Status: DC | PRN
  Administered 2017-06-01 – 2017-06-02 (×3): 650 mg via ORAL
  Filled 2017-06-01 (×3): qty 2

## 2017-06-01 MED ORDER — SODIUM CHLORIDE 0.9 % IV SOLN
INTRAVENOUS | Status: DC
  Administered 2017-06-01 (×3): via INTRAVENOUS

## 2017-06-01 MED ORDER — METHOCARBAMOL 500 MG PO TABS
500.0000 mg | ORAL_TABLET | Freq: Four times a day (QID) | ORAL | Status: DC | PRN
  Administered 2017-06-02: 500 mg via ORAL
  Filled 2017-06-01: qty 1

## 2017-06-01 MED ORDER — ONDANSETRON HCL 4 MG/2ML IJ SOLN: 4.0000 mg | Freq: Once | INTRAMUSCULAR | Status: DC | PRN

## 2017-06-01 MED ORDER — MIDAZOLAM HCL 5 MG/5ML IJ SOLN
INTRAMUSCULAR | Status: DC | PRN
  Administered 2017-06-01: 2 mg via INTRAVENOUS

## 2017-06-01 MED ORDER — NOREPINEPHRINE BITARTRATE 1 MG/ML IV SOLN
2.0000 ug/min | INTRAVENOUS | Status: DC
  Filled 2017-06-01: qty 4

## 2017-06-01 MED ORDER — NEOMYCIN-POLYMYXIN B GU 40-200000 IR SOLN
Status: DC | PRN
  Administered 2017-06-01: 2 mL

## 2017-06-01 MED ORDER — ONDANSETRON HCL 4 MG PO TABS: 4.0000 mg | ORAL_TABLET | Freq: Four times a day (QID) | ORAL | Status: DC | PRN

## 2017-06-01 MED ORDER — PROPOFOL 500 MG/50ML IV EMUL
INTRAVENOUS | Status: AC
  Filled 2017-06-01: qty 50

## 2017-06-01 MED ORDER — PHENOL 1.4 % MT LIQD
1.0000 | OROMUCOSAL | Status: DC | PRN
  Filled 2017-06-01: qty 177

## 2017-06-01 MED ORDER — EPHEDRINE SULFATE 50 MG/ML IJ SOLN
INTRAMUSCULAR | Status: AC
  Filled 2017-06-01: qty 1

## 2017-06-01 MED ORDER — ALUM & MAG HYDROXIDE-SIMETH 200-200-20 MG/5ML PO SUSP: 30.0000 mL | ORAL | Status: DC | PRN

## 2017-06-01 MED ORDER — ENOXAPARIN SODIUM 40 MG/0.4ML ~~LOC~~ SOLN: 40.0000 mg | SUBCUTANEOUS | Status: DC

## 2017-06-01 MED ORDER — CLINDAMYCIN PHOSPHATE 600 MG/50ML IV SOLN
INTRAVENOUS | Status: DC | PRN
  Administered 2017-06-01: 600 mg via INTRAVENOUS

## 2017-06-01 MED ORDER — MORPHINE SULFATE (PF) 2 MG/ML IV SOLN
2.0000 mg | INTRAVENOUS | Status: DC | PRN
  Administered 2017-06-01: 2 mg via INTRAVENOUS
  Filled 2017-06-01: qty 1

## 2017-06-01 MED ORDER — METHOCARBAMOL 1000 MG/10ML IJ SOLN
500.0000 mg | Freq: Four times a day (QID) | INTRAVENOUS | Status: DC | PRN
  Filled 2017-06-01: qty 5

## 2017-06-01 MED ORDER — ONDANSETRON HCL 4 MG/2ML IJ SOLN: 4.0000 mg | Freq: Four times a day (QID) | INTRAMUSCULAR | Status: DC | PRN

## 2017-06-01 MED ORDER — EPHEDRINE SULFATE 50 MG/ML IJ SOLN
INTRAMUSCULAR | Status: DC | PRN
  Administered 2017-06-01: 10 mg via INTRAVENOUS

## 2017-06-01 MED ORDER — TRAZODONE HCL 100 MG PO TABS
200.0000 mg | ORAL_TABLET | Freq: Every day | ORAL | Status: DC
  Administered 2017-06-03: 200 mg via ORAL
  Filled 2017-06-01: qty 2

## 2017-06-01 MED ORDER — PROPOFOL 10 MG/ML IV BOLUS
INTRAVENOUS | Status: DC | PRN
  Administered 2017-06-01: 20 mg via INTRAVENOUS
  Administered 2017-06-01: 10 mg via INTRAVENOUS
  Administered 2017-06-01: 20 mg via INTRAVENOUS

## 2017-06-01 MED ORDER — MAGNESIUM HYDROXIDE 400 MG/5ML PO SUSP
30.0000 mL | Freq: Every day | ORAL | Status: DC | PRN
  Administered 2017-06-01: 30 mL via ORAL
  Filled 2017-06-01: qty 30

## 2017-06-01 MED ORDER — ONDANSETRON HCL 4 MG/2ML IJ SOLN
4.0000 mg | Freq: Once | INTRAMUSCULAR | Status: AC
  Administered 2017-06-01: 4 mg via INTRAVENOUS

## 2017-06-01 MED ORDER — VASOPRESSIN 20 UNIT/ML IV SOLN
INTRAVENOUS | Status: AC
  Filled 2017-06-01: qty 1

## 2017-06-01 MED ORDER — TIOTROPIUM BROMIDE MONOHYDRATE 18 MCG IN CAPS
18.0000 ug | ORAL_CAPSULE | Freq: Every day | RESPIRATORY_TRACT | Status: DC
  Administered 2017-06-02 – 2017-06-04 (×3): 18 ug via RESPIRATORY_TRACT
  Filled 2017-06-01: qty 5

## 2017-06-01 SURGICAL SUPPLY — 33 items
BIT DRILL 4.3MMS DISTAL GRDTED (BIT) ×1 IMPLANT
CANISTER SUCT 1200ML W/VALVE (MISCELLANEOUS) ×3 IMPLANT
CHLORAPREP W/TINT 26ML (MISCELLANEOUS) ×3 IMPLANT
CORTICAL BONE SCR 5.0MM X 46MM (Screw) ×3 IMPLANT
DRAPE SHEET LG 3/4 BI-LAMINATE (DRAPES) ×3 IMPLANT
DRAPE U-SHAPE 47X51 STRL (DRAPES) ×3 IMPLANT
DRILL 4.3MMS DISTAL GRADUATED (BIT) ×3
DRSG OPSITE POSTOP 3X4 (GAUZE/BANDAGES/DRESSINGS) ×9 IMPLANT
GLOVE BIOGEL PI IND STRL 9 (GLOVE) ×1 IMPLANT
GLOVE BIOGEL PI INDICATOR 9 (GLOVE) ×2
GLOVE SURG SYN 9.0  PF PI (GLOVE) ×2
GLOVE SURG SYN 9.0 PF PI (GLOVE) ×1 IMPLANT
GOWN SRG 2XL LVL 4 RGLN SLV (GOWNS) ×1 IMPLANT
GOWN STRL NON-REIN 2XL LVL4 (GOWNS) ×2
GOWN STRL REUS W/ TWL LRG LVL3 (GOWN DISPOSABLE) ×1 IMPLANT
GOWN STRL REUS W/TWL LRG LVL3 (GOWN DISPOSABLE) ×2
GUIDEPIN VERSANAIL DSP 3.2X444 (ORTHOPEDIC DISPOSABLE SUPPLIES) ×3 IMPLANT
GUIDEWIRE BALL NOSE 80CM (WIRE) ×3 IMPLANT
HFN LH 130 DEG 11MM X 380MM (Orthopedic Implant) ×3 IMPLANT
HIP FRAC NAIL LAG SCR 10.5X100 (Orthopedic Implant) ×2 IMPLANT
KIT RM TURNOVER STRD PROC AR (KITS) ×3 IMPLANT
MAT BLUE FLOOR 46X72 FLO (MISCELLANEOUS) ×3 IMPLANT
NEEDLE FILTER BLUNT 18X 1/2SAF (NEEDLE) ×2
NEEDLE FILTER BLUNT 18X1 1/2 (NEEDLE) ×1 IMPLANT
NS IRRIG 500ML POUR BTL (IV SOLUTION) ×3 IMPLANT
PACK HIP COMPR (MISCELLANEOUS) ×3 IMPLANT
SCREW CANN THRD AFF 10.5X100 (Orthopedic Implant) ×1 IMPLANT
SCREW CORTICL BON 5.0MM X 46MM (Screw) ×1 IMPLANT
SCREWDRIVER HEX TIP 3.5MM (MISCELLANEOUS) ×3 IMPLANT
STAPLER SKIN PROX 35W (STAPLE) ×3 IMPLANT
SUT VIC AB 1 CT1 36 (SUTURE) ×3 IMPLANT
SUT VIC AB 2-0 CT1 (SUTURE) ×3 IMPLANT
SYRINGE 10CC LL (SYRINGE) ×3 IMPLANT

## 2017-06-01 NOTE — Anesthesia Post-op Follow-up Note (Signed)
Anesthesia QCDR form completed.        

## 2017-06-01 NOTE — Anesthesia Procedure Notes (Signed)
Spinal  Patient location during procedure: OR Staffing Anesthesiologist: Daisia Slomski Performed: anesthesiologist  Preanesthetic Checklist Completed: patient identified, site marked, surgical consent, pre-op evaluation, timeout performed, IV checked and risks and benefits discussed Spinal Block Patient position: sitting Prep: Betadine Patient monitoring: heart rate, cardiac monitor, continuous pulse ox and blood pressure Approach: midline Location: L3-4 Injection technique: single-shot Needle Needle type: Pencil-Tip  Needle gauge: 25 G Needle length: 9 cm Assessment Sensory level: T10     

## 2017-06-01 NOTE — NC FL2 (Signed)
[place] LEVEL OF CARE SCREENING TOOL     IDENTIFICATION  Patient Name: Kari Mcdonald Birthdate: 28-Jan-1932 Sex: female Admission Date (Current Location): 06/01/2017  [place] and [place] Number:  Engineering geologist and Address:  [facility], [address], [place], [place] [address]      Provider Number: [id number]  Attending Physician Name and Address:  Nicholes Mango, MD  Relative Name and Phone Number:       Current Level of Care: Hospital Recommended Level of Care: [place] Prior Approval Number:    Date Approved/Denied:   PASRR Number:  [id number])  Discharge Plan: SNF    Current Diagnoses: Patient Active Problem List   Diagnosis Date Noted  . Closed left hip fracture ([place]) 06/01/2017  . Noninfectious diarrhea   . Anemia 12/18/2016  . COPD (chronic obstructive pulmonary disease) ([place]) 12/18/2016  . Decreased hearing 12/18/2016  . Depression 12/18/2016  . GERD (gastroesophageal reflux disease) 12/18/2016  . GI bleed 12/18/2016  . Heart attack ([place]) 12/18/2016  . Hypertension 12/18/2016  . Knee osteoarthritis 12/18/2016  . Osteoporosis 12/18/2016  . Seasonal allergies 12/18/2016  . Skin cancer 12/18/2016  . AKI (acute kidney injury) ([place]) 11/17/2016  . Chronic diarrhea   . Loss of weight   . Intractable cyclical vomiting with nausea   . Hyperlipidemia, mixed 10/04/2014    Orientation RESPIRATION BLADDER Height & Weight     Self, Time, Situation, Place  O2 (2 Liters Oxygen ) Continent Weight: 150 lb 12.8 oz (68.4 kg) Height:  5\' 6"  (167.6 cm)  BEHAVIORAL SYMPTOMS/MOOD NEUROLOGICAL BOWEL NUTRITION STATUS   (none)  (none) Continent Diet (Diet: NPO for surgery. )  AMBULATORY STATUS COMMUNICATION OF NEEDS Skin   Extensive Assist Verbally Surgical wounds                       Personal Care Assistance Level of Assistance  Bathing, Feeding, Dressing Bathing Assistance: Limited  assistance Feeding assistance: Independent Dressing Assistance: Limited assistance     Functional Limitations Info  Sight, Hearing, Speech Sight Info: Adequate Hearing Info: Adequate Speech Info: Adequate    SPECIAL CARE FACTORS FREQUENCY  PT (By licensed PT), OT (By licensed OT)     PT Frequency:  (5) OT Frequency:  (5)            Contractures      Additional Factors Info  Code Status, Allergies Code Status Info:  (DNR ) Allergies Info:  (Penicillins. )           Current Medications (06/01/2017):  This is the current hospital active medication list Current Facility-Administered Medications  Medication Dose Route Frequency Provider Last Rate Last Dose  . 0.9 %  sodium chloride infusion   Intravenous Continuous Keeara Frees, MD      . Doug Sou Hold] acetaminophen (TYLENOL) tablet 650 mg  650 mg Oral Q6H PRN Jaystin Mcgarvey, MD       Or  . Doug Sou Hold] acetaminophen (TYLENOL) suppository 650 mg  650 mg Rectal Q6H PRN Oniyah Rohe, MD      . Doug Sou Hold] docusate sodium (COLACE) capsule 100 mg  100 mg Oral BID Nicholes Mango, MD   Stopped at 06/01/17 1555  . [MAR Hold] famotidine (PEPCID) IVPB 20 mg premix  20 mg Intravenous Q24H Alliana Mcauliff, MD      . Doug Sou Hold] fentaNYL (SUBLIMAZE) injection 50 mcg  50 mcg Intravenous Q1H PRN Harvest Dark, MD   50  mcg at 06/01/17 0942  . [MAR Hold] LORazepam (ATIVAN) tablet 0.5 mg  0.5 mg Oral Q6H PRN Khylin Gutridge, MD      . Doug Sou Hold] morphine 2 MG/ML injection 2 mg  2 mg Intravenous Q4H PRN Azad Calame, MD   2 mg at 06/01/17 1618  . [MAR Hold] ondansetron (ZOFRAN) tablet 4 mg  4 mg Oral Q6H PRN Nioma Mccubbins, MD       Or  . Doug Sou Hold] ondansetron (ZOFRAN) injection 4 mg  4 mg Intravenous Q6H PRN Pat Elicker, MD      . Doug Sou Hold] oxyCODONE (Oxy IR/ROXICODONE) immediate release tablet 5 mg  5 mg Oral Q4H PRN Narvel Kozub, MD      . Doug Sou Hold] tiotropium (SPIRIVA) inhalation capsule 18 mcg  18 mcg Inhalation Daily Nicholes Mango, MD   Stopped  at 06/01/17 1555  . [MAR Hold] traZODone (DESYREL) tablet 200 mg  200 mg Oral QHS Vennessa Affinito, Illene Silver, MD         Discharge Medications: Please see discharge summary for a list of discharge medications.  Relevant Imaging Results:  Relevant Lab Results:   Additional Information  (SSN: [id number])  Sample, Veronia Beets, LCSW

## 2017-06-01 NOTE — ED Triage Notes (Signed)
Arrived by EMS from home. A&O x4. Pt fell backwards into grass onto L hip while taking trash out. Shortening noted to L leg. EMS gave 4 IM morphine, 150/100 b/p, 80s pulse

## 2017-06-01 NOTE — Progress Notes (Signed)
Anticoagulation monitoring(Lovenox):  83 yo female ordered Lovenox 30 mg Q24h  Filed Weights   06/01/17 0900 06/01/17 1245  Weight: 153 lb (69.4 kg) 150 lb 12.8 oz (68.4 kg)   BMI    Lab Results  Component Value Date   CREATININE 1.19 (H) 06/01/2017   CREATININE 1.20 (H) 11/19/2016   CREATININE 2.09 (H) 11/18/2016   Estimated Creatinine Clearance: 33.5 mL/min (A) (by C-G formula based on SCr of 1.19 mg/dL (H)). Hemoglobin & Hematocrit     Component Value Date/Time   HGB 10.6 (L) 06/01/2017 2010   HGB 12.8 11/27/2013 1846   HCT 33.0 (L) 06/01/2017 2010   HCT 39.1 11/27/2013 1846     Per Protocol for Patient with estCrcl > 30 ml/min and BMI < 40, will transition to Lovenox 40 mg Q24h.

## 2017-06-01 NOTE — Progress Notes (Signed)
Patient was admitted to room 159 from ED. A&O x4. A little HOH. Covered in grass clippings. Scat bruising. On 2L O2, sats 94%. Family at bedside. Waiting on Ortho MD to see patient to get consent. Reviewed POC with family.

## 2017-06-01 NOTE — Anesthesia Procedure Notes (Signed)
Spinal  Patient location during procedure: OR Staffing Anesthesiologist: Babe Clenney Performed: anesthesiologist  Preanesthetic Checklist Completed: patient identified, site marked, surgical consent, pre-op evaluation, timeout performed, IV checked and risks and benefits discussed Spinal Block Patient position: sitting Prep: Betadine Patient monitoring: heart rate, cardiac monitor, continuous pulse ox and blood pressure Approach: midline Location: L3-4 Injection technique: single-shot Needle Needle type: Pencil-Tip  Needle gauge: 25 G Needle length: 9 cm Assessment Sensory level: T10     

## 2017-06-01 NOTE — Op Note (Signed)
06/01/2017  6:42 PM  PATIENT:  Kari Mcdonald  83 y.o. female  PRE-OPERATIVE DIAGNOSIS:  LEFT HIP FRACTURE, unstable intertrochanteric  POST-OPERATIVE DIAGNOSIS:  LEFT HIP FRACTURE, unstable intertrochanteric  PROCEDURE:  Procedure(s): INTRAMEDULLARY (IM) NAIL INTERTROCHANTRIC (Left)  SURGEON: Laurene Footman, MD  ASSISTANTS: None  ANESTHESIA:   spinal  EBL:  No intake/output data recorded.  BLOOD ADMINISTERED:none  DRAINS: none   LOCAL MEDICATIONS USED:  NONE  SPECIMEN:  No Specimen  DISPOSITION OF SPECIMEN:  N/A  COUNTS:  YES  TOURNIQUET:  * No tourniquets in log *  IMPLANTS: Biomet affixes 11 x 3 80 left rod with 100 mm lag screw and 46 mm distal interlocking screw  DICTATION: .Dragon Dictation patient brought the operating room and after adequate anesthesia was obtained the patient was transferred to the fracture table with right leg in the well leg holder left foot in the traction boot. Traction applied near anatomic alignment was obtained in both AP and lateral projections. After prepping and draping using a Barrier drape method appropriate patient identification and timeout procedure completed. Small incision was made proximally greater trochanter directly inserted the tibial trochanter. Proximal reaming was carried out followed by placement along a guidewire rod measuring off of this and determining rod length followed by reaming with a 13 mm reamer of 11 x 3 80 rod was inserted to the appropriate depth. A small lateral incision was made and a guidewire was inserted in a center center position measured and proximal drilling carried out. 100 mm lag screw was inserted and traction was released to allow for compression. The was then removed and the proximal locking mechanism tightened with a quarter turn to loosen to allow for further compression the proximal handle was then removed moved from the rod and distal interlocking screws made possible by bringing the C-arm  around and getting good view of the distal oblique lip locking screw hole. Small incision was made drilling was carried out followed by placing the screw. Next the wounds were thoroughly irrigated and the proximal wound closed with #1 Vicryl deep 2-0 Vicryl subcutaneously followed by skin staples the other wounds closed with 2-0 Vicryl and skin staples. Honeycomb dressing was Xeroform applied to the 2 distal incisions Xeroform 4 x 4's and ABDs proximally  PLAN OF CARE: Continues as an inpatient  PATIENT DISPOSITION:  PACU - hemodynamically stable.

## 2017-06-01 NOTE — Consult Note (Signed)
Patient is a 5 over female who was bringing her garbage out to the street this morning when she suffered a fall injuring her left leg. She is brought to the emergency room Left unstable intertrochanteric hip fracture ORIF later today

## 2017-06-01 NOTE — Progress Notes (Signed)
Pt down for surgery @1645 . Family followed. External catheter removed. IV and O2 went with patient.

## 2017-06-01 NOTE — Anesthesia Procedure Notes (Signed)
Spinal

## 2017-06-01 NOTE — Transfer of Care (Signed)
Immediate Anesthesia Transfer of Care Note  Patient: Kari Mcdonald  Procedure(s) Performed: Procedure(s): INTRAMEDULLARY (IM) NAIL INTERTROCHANTRIC (Left)  Patient Location: PACU  Anesthesia Type:Spinal  Level of Consciousness: awake, alert  and patient cooperative  Airway & Oxygen Therapy: Patient Spontanous Breathing and Patient connected to face mask oxygen  Post-op Assessment: Report given to RN and Post -op Vital signs reviewed and unstable, Anesthesiologist notified  Post vital signs: Reviewed and unstable  Last Vitals:  Vitals:   06/01/17 1855 06/01/17 1859  BP: (!) 118/96 (!) 85/58  Pulse:    Resp: 12 13  Temp:    SpO2: 99% 98%    Last Pain:  Vitals:   06/01/17 1847  TempSrc:   PainSc: Asleep      Patients Stated Pain Goal: 2 (20/25/42 7062)  Complications: cardiovascular complications

## 2017-06-01 NOTE — ED Provider Notes (Signed)
[facility] Emergency Department Provider Note  Time seen: 9:24 AM  I have reviewed the triage vital signs and the nursing notes.   HISTORY  Chief Complaint Fall    HPI Kari Mcdonald is a 83 y.o. female with a past medical history of anemia, arthritis, COPD, depression, gastric reflux, hypertension, presents to the emergency department for left hip pain after a fall. According to the patient she was taking her trash out when she tripped falling onto her left side. States immediate pain to the left hip has been unable to walk or stand since the fall. Denies hitting her head. Denies LOC. Denies any other pain at this time.  Past Medical History:  Diagnosis Date  . Anemia   . Arthritis    left knee  . Cancer ([place])    Skin Cancer  . COPD (chronic obstructive pulmonary disease) ([place])   . Coronary artery disease   . Depression   . Diverticulosis   . Dysphagia   . Dyspnea   . GERD (gastroesophageal reflux disease)   . GI bleed   . Headache    from eye strain  . History of hiatal hernia   . HOH (hard of hearing)   . Hyperlipidemia   . Hypertension   . Myocardial infarction ([place])    2006  . Osteoporosis   . Wears dentures    full upper and lower    Patient Active Problem List   Diagnosis Date Noted  . Noninfectious diarrhea   . Anemia 12/18/2016  . COPD (chronic obstructive pulmonary disease) ([place]) 12/18/2016  . Decreased hearing 12/18/2016  . Depression 12/18/2016  . GERD (gastroesophageal reflux disease) 12/18/2016  . GI bleed 12/18/2016  . Heart attack ([place]) 12/18/2016  . Hypertension 12/18/2016  . Knee osteoarthritis 12/18/2016  . Osteoporosis 12/18/2016  . Seasonal allergies 12/18/2016  . Skin cancer 12/18/2016  . AKI (acute kidney injury) ([place]) 11/17/2016  . Chronic diarrhea   . Loss of weight   . Intractable cyclical vomiting with nausea   . Hyperlipidemia, mixed 10/04/2014    Past Surgical History:  Procedure Laterality  Date  . ABDOMINAL HYSTERECTOMY    . APPENDECTOMY    . CATARACT EXTRACTION W/PHACO Left 08/06/2016   Procedure: CATARACT EXTRACTION PHACO AND INTRAOCULAR LENS PLACEMENT (IOC);  Surgeon: Leandrew Koyanagi, MD;  Location: [facility];  Service: Ophthalmology;  Laterality: Left;  . CATARACT EXTRACTION W/PHACO Right 09/10/2016   Procedure: CATARACT EXTRACTION PHACO AND INTRAOCULAR LENS PLACEMENT (IOC);  Surgeon: Leandrew Koyanagi, MD;  Location: [facility];  Service: Ophthalmology;  Laterality: Right;  . CHOLECYSTECTOMY    . COLONOSCOPY    . COLONOSCOPY WITH PROPOFOL N/A 12/22/2016   Procedure: COLONOSCOPY WITH PROPOFOL;  Surgeon: Lucilla Lame, MD;  Location: [facility];  Service: Endoscopy;  Laterality: N/A;  . ESOPHAGOGASTRODUODENOSCOPY    . ESOPHAGOGASTRODUODENOSCOPY (EGD) WITH PROPOFOL N/A 07/21/2016   Procedure: ESOPHAGOGASTRODUODENOSCOPY (EGD) WITH PROPOFOL;  Surgeon: Lollie Sails, MD;  Location[facility] ENDOSCOPY;  Service: Endoscopy;  Laterality: N/A;  . GANGLION CYST EXCISION Left    wrist (x2)  . PILONIDAL CYST EXCISION      Prior to Admission medications   Medication Sig Start Date End Date Taking? Authorizing Provider  albuterol (PROVENTIL HFA;VENTOLIN HFA) 108 (90 Base) MCG/ACT inhaler Inhale 2 puffs into the lungs every 6 (six) hours as needed for wheezing or shortness of breath.    [provider]  aspirin EC 81 MG tablet Take 81 mg by  mouth daily.    [provider]  atorvastatin (LIPITOR) 20 MG tablet Take 20 mg by mouth daily.    [provider]  budesonide (ENTOCORT EC) 3 MG 24 hr capsule Take 3 capsules (9 mg total) by mouth every morning. 01/19/17   Lucilla Lame, MD  citalopram (CELEXA) 40 MG tablet Take 40 mg by mouth daily.    [provider]  dexlansoprazole (DEXILANT) 60 MG capsule Take 60 mg by mouth daily.    [provider]  enalapril (VASOTEC) 20 MG tablet Take 20 mg by mouth daily. 11/13/16    [provider]  feeding supplement, ENSURE ENLIVE, (ENSURE ENLIVE) LIQD Take 237 mLs by mouth 3 (three) times daily with meals. 11/19/16   Bettey Costa, MD  Lactobacillus (PROBIOTIC ACIDOPHILUS PO) Take 1 tablet by mouth daily.    [provider]  polyethylene glycol (GOLYTELY) 236 g solution Drink one 8 oz glass every 20 mins until stools are clear. 12/19/16   Lucilla Lame, MD  tiotropium (SPIRIVA) 18 MCG inhalation capsule Place 18 mcg into inhaler and inhale daily.    [provider]  traZODone (DESYREL) 100 MG tablet Take 200 mg by mouth at bedtime.    [provider]  triamterene-hydrochlorothiazide (MAXZIDE) 75-50 MG tablet Take by mouth. 04/28/16   [provider]    Allergies  Allergen Reactions  . Penicillins Rash    History reviewed. No pertinent family history.  Social History Social History  Substance Use Topics  . Smoking status: Current Every Day Smoker    Packs/day: 0.50    Years: 60.00  . Smokeless tobacco: Never Used  . Alcohol use No    Review of Systems Constitutional: Negative for fever. Cardiovascular: Negative for chest pain. Respiratory: Negative for shortness of breath. Gastrointestinal: Negative for abdominal pain Musculoskeletal: Positive for left hip pain Neurological: Negative for headache All other ROS negative  ____________________________________________   PHYSICAL EXAM:  VITAL SIGNS: ED Triage Vitals  Enc Vitals Group     BP 06/01/17 0904 (!) 108/59     Pulse Rate 06/01/17 0904 60     Resp 06/01/17 0904 (!) 25     Temp 06/01/17 0904 97.8 F (36.6 C)     Temp Source 06/01/17 0904 Oral     SpO2 06/01/17 0904 98 %     Weight 06/01/17 0900 153 lb (69.4 kg)     Height 06/01/17 0900 5\' 6"  (1.676 m)     Head Circumference --      Peak Flow --      Pain Score 06/01/17 0859 9     Pain Loc --      Pain Edu? --      Excl. in [place]? --     Constitutional: Alert and oriented. Well appearing and in no  distress. Eyes: Normal exam ENT   Head: Normocephalic and atraumatic.   Mouth/Throat: Mucous membranes are moist. Cardiovascular: Normal rate, regular rhythm. No murmur Respiratory: Normal respiratory effort without tachypnea nor retractions. Breath sounds are clear  Gastrointestinal: Soft and nontender. No distention.   Musculoskeletal: Moderate left hip tenderness to palpation, neurovascularly intact with 1-2+ DP pulse. Able to move toes, etc. Left lower extremity is shortened and externally rotated exam most consistent with fracture. Neurologic:  Normal speech and language. No gross focal neurologic deficits Skin:  Skin is warm, dry and intact.  Psychiatric: Mood and affect are normal.   ____________________________________________    EKG  EKG reviewed and interpreted by myself  shows normal sinus rhythm at 56 bpm, narrow QRS, normal axis, normal intervals, no concerning ST changes occasional PVC.  ____________________________________________    RADIOLOGY  X-ray consistent with left intertrochanteric fracture.    ____________________________________________   INITIAL IMPRESSION / ASSESSMENT AND PLAN / ED COURSE  Pertinent labs & imaging results that were available during my care of the patient were reviewed by me and considered in my medical decision making (see chart for details).  Patient presents in the emergency department after a fall with left hip pain. Besides the left hip the rest of her exam is atraumatic. Patient is awake alert oriented. We'll obtain x-rays of the hip to confirm fracture we'll obtain chest x-ray and EKG for preop.  ____________________________________________   FINAL CLINICAL IMPRESSION(S) / ED DIAGNOSES  Left hip fracture    Harvest Dark, MD 06/01/17 [phone]

## 2017-06-01 NOTE — H&P (Signed)
[facility] [place] NAME: Kari Mcdonald    MR#:  [id number]  DATE OF BIRTH:  02-14-1932  DATE OF ADMISSION:  06/01/2017  PRIMARY CARE PHYSICIAN: Sofie Hartigan, MD   REQUESTING/REFERRING PHYSICIan  ; pasuchowski   CHIEF COMPLAINT:   Hip pain HISTORY OF PRESENT ILLNESS:  Kari Mcdonald  is a 83 y.o. female with a known history of hypertension, hyperlipidemia, COPD, depression and anxiety had a mechanical fall today and landed on her left side. Patient was reporting left hip pain and brought into the emergency department revealed closed left intertrochanteric fracture. ED physician has discussed with on-call orthopedics Dr. Rudene Christians who is planning to do surgery at 1 PM today Patient denies any cardiac history, denies any chest pain or shortness of breath. Resting comfortably and reporting left hip pain. Family members at bedside  PAST MEDICAL HISTORY:   Past Medical History:  Diagnosis Date  . Anemia   . Arthritis    left knee  . Cancer ([place])    Skin Cancer  . COPD (chronic obstructive pulmonary disease) (Kari Mcdonald)   . Coronary artery disease   . Depression   . Diverticulosis   . Dysphagia   . Dyspnea   . GERD (gastroesophageal reflux disease)   . GI bleed   . Headache    from eye strain  . History of hiatal hernia   . HOH (hard of hearing)   . Hyperlipidemia   . Hypertension   . Myocardial infarction (Kari Mcdonald)    2006  . Osteoporosis   . Wears dentures    full upper and lower    PAST SURGICAL HISTOIRY:   Past Surgical History:  Procedure Laterality Date  . ABDOMINAL HYSTERECTOMY    . APPENDECTOMY    . CATARACT EXTRACTION W/PHACO Left 08/06/2016   Procedure: CATARACT EXTRACTION PHACO AND INTRAOCULAR LENS PLACEMENT (IOC);  Surgeon: Leandrew Koyanagi, MD;  Location: [facility];  Service: Ophthalmology;  Laterality: Left;  . CATARACT EXTRACTION W/PHACO Right 09/10/2016   Procedure: CATARACT EXTRACTION  PHACO AND INTRAOCULAR LENS PLACEMENT (IOC);  Surgeon: Leandrew Koyanagi, MD;  Location: [facility];  Service: Ophthalmology;  Laterality: Right;  . CHOLECYSTECTOMY    . COLONOSCOPY    . COLONOSCOPY WITH PROPOFOL N/A 12/22/2016   Procedure: COLONOSCOPY WITH PROPOFOL;  Surgeon: Lucilla Lame, MD;  Location: [facility];  Service: Endoscopy;  Laterality: N/A;  . ESOPHAGOGASTRODUODENOSCOPY    . ESOPHAGOGASTRODUODENOSCOPY (EGD) WITH PROPOFOL N/A 07/21/2016   Procedure: ESOPHAGOGASTRODUODENOSCOPY (EGD) WITH PROPOFOL;  Surgeon: Lollie Sails, MD;  Location: [facility] ENDOSCOPY;  Service: Endoscopy;  Laterality: N/A;  . GANGLION CYST EXCISION Left    wrist (x2)  . PILONIDAL CYST EXCISION      SOCIAL HISTORY:   Social History  Substance Use Topics  . Smoking status: Current Every Day Smoker    Packs/day: 0.50    Years: 60.00  . Smokeless tobacco: Never Used  . Alcohol use No    FAMILY HISTORY:  Hypertension runs in her family  DRUG ALLERGIES:   Allergies  Allergen Reactions  . Penicillins Rash    Has patient had a PCN reaction causing immediate rash, facial/tongue/throat swelling, SOB or lightheadedness with hypotension: Unknown Has patient had a PCN reaction causing severe rash involving mucus membranes or skin necrosis: Unknown Has patient had a PCN reaction that required hospitalization: Unknown Has patient had a PCN reaction occurring within the last 10 years: Unknown If all of the above  answers are "NO", then may proceed with Cephalosporin use.     REVIEW OF SYSTEMS:  CONSTITUTIONAL: No fever, fatigue or weakness.  EYES: No blurred or double vision.  EARS, NOSE, AND THROAT: No tinnitus or ear pain.  RESPIRATORY: No cough, shortness of breath, wheezing or hemoptysis.  CARDIOVASCULAR: No chest pain, orthopnea, edema.  GASTROINTESTINAL: No nausea, vomiting, diarrhea or abdominal pain.  GENITOURINARY: No dysuria, hematuria.  ENDOCRINE: No polyuria, nocturia,   HEMATOLOGY: No anemia, easy bruising or bleeding SKIN: No rash or lesion. MUSCULOSKELETAL: reporting left hip pain, has chronic history of arthritis and osteoporosis NEUROLOGIC: No tingling, numbness, weakness.  PSYCHIATRY: No anxiety or depression.   MEDICATIONS AT HOME:   Prior to Admission medications   Medication Sig Start Date End Date Taking? Authorizing Provider  meloxicam (MOBIC) 7.5 MG tablet Take 7.5 mg by mouth daily. 05/18/17  Yes [provider]  albuterol (PROVENTIL HFA;VENTOLIN HFA) 108 (90 Base) MCG/ACT inhaler Inhale 2 puffs into the lungs every 6 (six) hours as needed for wheezing or shortness of breath.    [provider]  aspirin EC 81 MG tablet Take 81 mg by mouth daily.    [provider]  atorvastatin (LIPITOR) 20 MG tablet Take 20 mg by mouth daily.    [provider]  budesonide (ENTOCORT EC) 3 MG 24 hr capsule Take 3 capsules (9 mg total) by mouth every morning. 01/19/17   Lucilla Lame, MD  citalopram (CELEXA) 40 MG tablet Take 40 mg by mouth daily.    [provider]  dexlansoprazole (DEXILANT) 60 MG capsule Take 60 mg by mouth daily.    [provider]  enalapril (VASOTEC) 20 MG tablet Take 20 mg by mouth daily. 11/13/16   [provider]  feeding supplement, ENSURE ENLIVE, (ENSURE ENLIVE) LIQD Take 237 mLs by mouth 3 (three) times daily with meals. 11/19/16   Kari Costa, MD  Lactobacillus (PROBIOTIC ACIDOPHILUS PO) Take 1 tablet by mouth daily.    [provider]  polyethylene glycol (GOLYTELY) 236 g solution Drink one 8 oz glass every 20 mins until stools are clear. 12/19/16   Lucilla Lame, MD  tiotropium (SPIRIVA) 18 MCG inhalation capsule Place 18 mcg into inhaler and inhale daily.    [provider]  traZODone (DESYREL) 100 MG tablet Take 200 mg by mouth at bedtime.    [provider]  triamterene-hydrochlorothiazide (MAXZIDE) 75-50 MG tablet Take by mouth. 04/28/16    [provider]      VITAL SIGNS:  Blood pressure (!) 132/53, pulse 65, temperature 97.8 F (36.6 C), temperature source Oral, resp. rate 15, height 5\' 6"  (1.676 m), weight 69.4 kg (153 lb), SpO2 96 %.  PHYSICAL EXAMINATION:  GENERAL:  83 y.o.-year-old patient lying in the bed with no acute distress.  EYES: Pupils equal, round, reactive to light and accommodation. No scleral icterus. Extraocular muscles intact.  HEENT: Head atraumatic, normocephalic. Oropharynx and nasopharynx clear.  NECK:  Supple, no jugular venous distention. No thyroid enlargement, no tenderness.  LUNGS: Normal breath sounds bilaterally, no wheezing, rales,rhonchi or crepitation. No use of accessory muscles of respiration.  CARDIOVASCULAR: S1, S2 normal. No murmurs, rubs, or gallops.  ABDOMEN: Soft, nontender, nondistended. Bowel sounds present. No organomegaly or mass.  EXTREMITIES: left hip is tender, externally rotated, no bruising.No pedal edema, cyanosis, or clubbing.  NEUROLOGIC: Cranial nerves II through XII are intact. Muscle strength 5/5 in all extremities. Sensation intact. Gait not checked.  PSYCHIATRIC: The patient is alert and  oriented x 3.  SKIN: No obvious rash, lesion, or ulcer.   LABORATORY PANEL:   CBC  Recent Labs Lab 06/01/17 0910  WBC 14.2*  HGB 13.5  HCT 40.7  PLT 237   ------------------------------------------------------------------------------------------------------------------  Chemistries   Recent Labs Lab 06/01/17 0910  NA 138  K 4.2  CL 102  CO2 27  GLUCOSE 126*  BUN 19  CREATININE 1.19*  CALCIUM 9.7  AST 24  ALT 14  ALKPHOS 69  BILITOT 0.8   ------------------------------------------------------------------------------------------------------------------  Cardiac Enzymes No results for input(s): TROPONINI in the last 168  hours. ------------------------------------------------------------------------------------------------------------------  RADIOLOGY:  Dg Chest 1 View  Result Date: 06/01/2017 CLINICAL DATA:  Fall onto left hip. History of COPD, coronary artery disease, GERD, hypertension, MI, smoker. EXAM: CHEST 1 VIEW COMPARISON:  Chest x-ray dated 08/09/2015. FINDINGS: New irregular nodular density within the left upper lung, suprahilar. Lungs appear otherwise clear. No pleural effusion or pneumothorax seen, although characterization is limited by over penetration. Heart size and mediastinal contours are stable. Atherosclerosis noted at the aortic arch. Degenerative spurring noted at the left shoulder. No osseous fracture or dislocation seen. IMPRESSION: 1. Irregular nodular density within the left upper lung, suprahilar. This is a suspicious finding for which chest CT is recommended. 2. Aortic atherosclerosis. Electronically Signed   By: Franki Cabot M.D.   On: 06/01/2017 10:39   Dg Hip Unilat W Or Wo Pelvis 2-3 Views Left  Result Date: 06/01/2017 CLINICAL DATA:  Fall. EXAM: DG HIP (WITH OR WITHOUT PELVIS) 2-3V LEFT COMPARISON:  11/17/2016 . FINDINGS: Degenerative changes lumbar spine and both hips. Comminuted angulated left hip intertrochanteric fracture noted. Peripheral vascular calcification IMPRESSION: 1.  Comminuted, angulated left hip intertrochanteric fracture. 2. Peripheral vascular disease. Electronically Signed   By: Marcello Moores  Register   On: 06/01/2017 10:38    EKG:   Orders placed or performed during the hospital encounter of 06/01/17  . ED EKG  . ED EKG    IMPRESSION AND PLAN:   Dorie Ohms  is a 83 y.o. female with a known history of hypertension, hyperlipidemia, COPD, depression and anxiety had a mechanical fall today and landed on her left side. Patient was reporting left hip pain and brought into the emergency department revealed closed left intertrochanteric fracture. ED physician has  discussed with on-call orthopedics Dr. Rudene Christians who is planning to do surgery at 1 PM today  #left hip intertrochanteric fracture closed Admit to MedSurg unit Nothing by mouth Medically optimized for surgery today by Dr. Rudene Christians Pain management as needed Supportive treatment  #Essential hypertension Blood pressure is soft at this time, providing IV fluids, hold antihypertensives  #Hyperlipidemia Continue statin once patient is tolerating diet currently she is npo  #COPD No exacerbation, continue Spiriva,  Nebulizer treatments as needed  #Anxiety Provide lorazepam as needed basis only  #GERD Pepcid IV  DVT prophylaxis with SCDs as patient is scheduled for surgery today    All the records are reviewed and case discussed with ED provider. Management plans discussed with the patient, family and they are in agreement.  CODE STATUS: do not resuscitate, SON IS THE HEALTHCARE POWER OF ATTORNEY  TOTAL TIME TAKING CARE OF THIS PATIENT: 45 minutes.   Note: This dictation was prepared with Dragon dictation along with smaller phrase technology. Any transcriptional errors that result from this process are unintentional.  Nicholes Mango M.D on 06/01/2017 at 11:44 AM  Between 7am to 6pm - Pager - [phone]  After 6pm go to [email or web address] - password [email or web address]  [facility]  Office  [phone]  CC: Primary care physician; Sofie Hartigan, MD

## 2017-06-01 NOTE — Anesthesia Preprocedure Evaluation (Signed)
Anesthesia Evaluation  Patient identified by MRN, date of birth, ID band Patient awake    Reviewed: Allergy & Precautions, NPO status , Patient's Chart, lab work & pertinent test results, reviewed documented beta blocker date and time   Airway Mallampati: II  TM Distance: >3 FB     Dental  (+) Upper Dentures, Lower Dentures   Pulmonary shortness of breath, COPD, Current Smoker,           Cardiovascular hypertension, Pt. on medications + CAD and + Past MI       Neuro/Psych  Headaches, PSYCHIATRIC DISORDERS Depression    GI/Hepatic hiatal hernia, GERD  ,  Endo/Other    Renal/GU Renal disease     Musculoskeletal  (+) Arthritis ,   Abdominal   Peds  Hematology  (+) anemia ,   Anesthesia Other Findings BPs in the 92/47 range preop. Son states that pt never had an MI. One episode of VT. Dec hearing.  Reproductive/Obstetrics                             Anesthesia Physical Anesthesia Plan  ASA: III  Anesthesia Plan: Spinal   Post-op Pain Management:    Induction:   PONV Risk Score and Plan:   Airway Management Planned:   Additional Equipment:   Intra-op Plan:   Post-operative Plan:   Informed Consent: I have reviewed the patients History and Physical, chart, labs and discussed the procedure including the risks, benefits and alternatives for the proposed anesthesia with the patient or authorized representative who has indicated his/her understanding and acceptance.     Plan Discussed with: CRNA  Anesthesia Plan Comments:         Anesthesia Quick Evaluation

## 2017-06-02 ENCOUNTER — Encounter: Payer: Self-pay | Admitting: Orthopedic Surgery

## 2017-06-02 LAB — COMPREHENSIVE METABOLIC PANEL
ALT: 16 U/L (ref 14–54)
AST: 35 U/L (ref 15–41)
Albumin: 2.6 g/dL — ABNORMAL LOW (ref 3.5–5.0)
Alkaline Phosphatase: 42 U/L (ref 38–126)
Anion gap: 6 (ref 5–15)
BILIRUBIN TOTAL: 0.6 mg/dL (ref 0.3–1.2)
BUN: 23 mg/dL — AB (ref 6–20)
CHLORIDE: 112 mmol/L — AB (ref 101–111)
CO2: 22 mmol/L (ref 22–32)
CREATININE: 1.52 mg/dL — AB (ref 0.44–1.00)
Calcium: 8 mg/dL — ABNORMAL LOW (ref 8.9–10.3)
GFR, EST AFRICAN AMERICAN: 35 mL/min — AB (ref >60)
GFR, EST NON AFRICAN AMERICAN: 31 mL/min — AB (ref >60)
Glucose, Bld: 153 mg/dL — ABNORMAL HIGH (ref 65–99)
POTASSIUM: 4.6 mmol/L (ref 3.5–5.1)
Sodium: 140 mmol/L (ref 135–145)
TOTAL PROTEIN: 4.7 g/dL — AB (ref 6.5–8.1)

## 2017-06-02 LAB — URINALYSIS, COMPLETE (UACMP) WITH MICROSCOPIC
BACTERIA UA: NONE SEEN
Bilirubin Urine: NEGATIVE
Glucose, UA: NEGATIVE mg/dL
KETONES UR: NEGATIVE mg/dL
Nitrite: NEGATIVE
PROTEIN: 30 mg/dL — AB
Specific Gravity, Urine: 1.015 (ref 1.005–1.030)
pH: 5 (ref 5.0–8.0)

## 2017-06-02 LAB — CBC
HCT: 28.3 % — ABNORMAL LOW (ref 35.0–47.0)
Hemoglobin: 9.2 g/dL — ABNORMAL LOW (ref 12.0–16.0)
MCH: 28.6 pg (ref 26.0–34.0)
MCHC: 32.4 g/dL (ref 32.0–36.0)
MCV: 88.2 fL (ref 80.0–100.0)
PLATELETS: 193 10*3/uL (ref 150–440)
RBC: 3.21 MIL/uL — ABNORMAL LOW (ref 3.80–5.20)
RDW: 15.6 % — AB (ref 11.5–14.5)
WBC: 18.6 10*3/uL — AB (ref 3.6–11.0)

## 2017-06-02 MED ORDER — SODIUM CHLORIDE 0.9 % IV BOLUS (SEPSIS)
250.0000 mL | Freq: Once | INTRAVENOUS | Status: AC
  Administered 2017-06-02: 250 mL via INTRAVENOUS

## 2017-06-02 MED ORDER — FE FUMARATE-B12-VIT C-FA-IFC PO CAPS
1.0000 | ORAL_CAPSULE | Freq: Two times a day (BID) | ORAL | Status: DC
  Administered 2017-06-02 – 2017-06-04 (×5): 1 via ORAL
  Filled 2017-06-02 (×6): qty 1

## 2017-06-02 MED ORDER — ENOXAPARIN SODIUM 30 MG/0.3ML ~~LOC~~ SOLN
30.0000 mg | SUBCUTANEOUS | Status: DC
  Administered 2017-06-02 – 2017-06-03 (×2): 30 mg via SUBCUTANEOUS
  Filled 2017-06-02 (×2): qty 0.3

## 2017-06-02 MED ORDER — SODIUM CHLORIDE 0.9 % IV SOLN: INTRAVENOUS | Status: AC

## 2017-06-02 MED ORDER — SODIUM CHLORIDE 0.9 % IV SOLN
INTRAVENOUS | Status: AC
  Administered 2017-06-02: 19:00:00 via INTRAVENOUS

## 2017-06-02 NOTE — Progress Notes (Signed)
A&OX4. Pt asymptomatic with low BPs and tolerating clear liquids. Md called for BP of 78/42. Treated with 1 L fluid bolus. BP now 97/37, MD made aware and is fine with this BP. Will continue to monitor.

## 2017-06-02 NOTE — Progress Notes (Signed)
MD called for low BP this AM. 250 ml bolus administered. Pt tolerated well. Asymptomatic. BP now 95/37. Urine output is 95 ml for this shift. MD aware of low urine output. Will keep monitoring.

## 2017-06-02 NOTE — Progress Notes (Signed)
   Subjective: 1 Day Post-Op Procedure(s) (LRB): INTRAMEDULLARY (IM) NAIL INTERTROCHANTRIC (Left) Patient reports pain as mild, severe with movement Patient is well, and has had no acute complaints or problems Denies any CP, SOB, ABD pain. We will continue therapy today.  Plan is to go Skilled nursing facility after hospital stay.  Objective: Vital signs in last 24 hours: Temp:  [97 F (36.1 C)-98.4 F (36.9 C)] 98.4 F (36.9 C) (08/28 0742) Pulse Rate:  [44-83] 70 (08/28 0742) Resp:  [11-18] 18 (08/28 0742) BP: (64-135)/(27-96) 129/36 (08/28 0742) SpO2:  [94 %-100 %] 100 % (08/28 0742) FiO2 (%):  [32 %] 32 % (08/27 2142) Weight:  [68.4 kg (150 lb 12.8 oz)] 68.4 kg (150 lb 12.8 oz) (08/27 1245)  Intake/Output from previous day: 08/27 0701 - 08/28 0700 In: 3736.7 [P.O.:50; I.V.:2857.5; IV Piggyback:829.2] Out: 645 [Urine:345; Blood:300] Intake/Output this shift: No intake/output data recorded.   Recent Labs  06/01/17 0910 06/01/17 2010 06/02/17 0432  HGB 13.5 10.6* 9.2*    Recent Labs  06/01/17 0910 06/01/17 2010 06/02/17 0432  WBC 14.2*  --  18.6*  RBC 4.81  --  3.21*  HCT 40.7 33.0* 28.3*  PLT 237  --  193    Recent Labs  06/01/17 0910 06/02/17 0432  NA 138 140  K 4.2 4.6  CL 102 112*  CO2 27 22  BUN 19 23*  CREATININE 1.19* 1.52*  GLUCOSE 126* 153*  CALCIUM 9.7 8.0*   No results for input(s): LABPT, INR in the last 72 hours.  EXAM General - Patient is Alert, Appropriate and Oriented Extremity - Neurovascular intact Sensation intact distally Intact pulses distally Dorsiflexion/Plantar flexion intact No cellulitis present Compartment soft Dressing - dressing C/D/I and no drainage Motor Function - intact, moving foot and toes well on exam.   Past Medical History:  Diagnosis Date  . Anemia   . Arthritis    left knee  . Cancer ([place])    Skin Cancer  . COPD (chronic obstructive pulmonary disease) ([place])   . Coronary artery disease   .  Depression   . Diverticulosis   . Dysphagia   . Dyspnea   . GERD (gastroesophageal reflux disease)   . GI bleed   . Headache    from eye strain  . History of hiatal hernia   . HOH (hard of hearing)   . Hyperlipidemia   . Hypertension   . Myocardial infarction ([place])    2006  . Osteoporosis   . Wears dentures    full upper and lower    Assessment/Plan:   1 Day Post-Op Procedure(s) (LRB): INTRAMEDULLARY (IM) NAIL INTERTROCHANTRIC (Left) Active Problems:   Closed left hip fracture (HCC)   Acute post op blood loss anemia   Estimated body mass index is 24.34 kg/m as calculated from the following:   Height as of this encounter: 5\' 6"  (1.676 m).   Weight as of this encounter: 68.4 kg (150 lb 12.8 oz). Advance diet Up with therapy  Needs BM Acute post op blood loss anemia - Hgb 9.2, start Iron supplement. Recheck labs in am CM to assist with discharge   DVT Prophylaxis - Lovenox, Foot Pumps and TED hose Weight-Bearing as tolerated to left leg   T. Rachelle Hora, PA-C [place] 06/02/2017, 9:44 AM

## 2017-06-02 NOTE — Anesthesia Postprocedure Evaluation (Signed)
Anesthesia Post Note  Patient: Kari Mcdonald  Procedure(s) Performed: Procedure(s) (LRB): INTRAMEDULLARY (IM) NAIL INTERTROCHANTRIC (Left)  Patient location during evaluation: PACU Anesthesia Type: Spinal Level of consciousness: oriented and awake and alert Pain management: pain level controlled Vital Signs Assessment: post-procedure vital signs reviewed and stable Respiratory status: spontaneous breathing, respiratory function stable and patient connected to nasal cannula oxygen Cardiovascular status: blood pressure returned to baseline and stable Postop Assessment: no headache and no backache Anesthetic complications: no     Last Vitals:  Vitals:   06/02/17 0511 06/02/17 0602  BP: (!) 83/36 (!) 95/37  Pulse: 76 77  Resp:    Temp:    SpO2:  98%    Last Pain:  Vitals:   06/02/17 0533  TempSrc:   PainSc: [place]

## 2017-06-02 NOTE — Progress Notes (Signed)
OT Cancellation Note  Patient Details Name: ARMEDA PLUMB MRN: [id number] DOB: 1932/05/04   Cancelled Treatment:    Reason Eval/Treat Not Completed: Patient at procedure or test/ unavailable. Order received, chart reviewed, pt/family meeting with LCSW. Will re-attempt OT evaluation at later time as pt is available.  Jeni Salles, MPH, MS, OTR/L ascom [phone] 06/02/17, 10:59 AM

## 2017-06-02 NOTE — Care Management Note (Signed)
Case Management Note  Patient Details  Name: SHEILIA REZNICK MRN: [id number] Date of Birth: September 04, 1932  Subjective/Objective:  RNCM consult for discharge planning. PT recommending SNF. RNCM will sign off. Please re-consult if needs arise.                   Action/Plan:   Expected Discharge Date:  06/03/17               Expected Discharge Plan:  Skilled Nursing Facility  In-House Referral:  Clinical Social Work  Discharge planning Services  CM Consult  Post Acute Care Choice:    Choice offered to:     DME Arranged:    DME Agency:     HH Arranged:    [place] Agency:     Status of Service:  Completed, signed off  If discussed at [employer], dates discussed:    Additional Comments:  Jolly Mango, RN 06/02/2017, 12:01 PM

## 2017-06-02 NOTE — Progress Notes (Signed)
[facility] [place] NAME: Kari Mcdonald    MR#:  [id number]  DATE OF BIRTH:  1932-01-05  SUBJECTIVE:  CHIEF COMPLAINT:   Chief Complaint  Patient presents with  . Fall   - postop day 1 after left intertrochanteric fracture. Complaints of dyspnea and currently on 3 L oxygen which is acute -Decreased urine output. WBC is still elevated  REVIEW OF SYSTEMS:  Review of Systems  Constitutional: Negative for chills, fever and malaise/fatigue.  HENT: Negative for congestion, ear discharge, hearing loss and nosebleeds.   Eyes: Negative for blurred vision and double vision.  Respiratory: Positive for shortness of breath. Negative for cough and wheezing.   Cardiovascular: Negative for chest pain and palpitations.  Gastrointestinal: Negative for abdominal pain, constipation, diarrhea, nausea and vomiting.  Genitourinary: Negative for dysuria.  Musculoskeletal: Positive for joint pain and myalgias.  Neurological: Negative for dizziness, speech change, focal weakness, seizures and headaches.  Psychiatric/Behavioral: Negative for depression.    DRUG ALLERGIES:   Allergies  Allergen Reactions  . Penicillins Rash    Has patient had a PCN reaction causing immediate rash, facial/tongue/throat swelling, SOB or lightheadedness with hypotension: Unknown Has patient had a PCN reaction causing severe rash involving mucus membranes or skin necrosis: Unknown Has patient had a PCN reaction that required hospitalization: Unknown Has patient had a PCN reaction occurring within the last 10 years: Unknown If all of the above answers are "NO", then may proceed with Cephalosporin use.     VITALS:  Blood pressure (!) 129/36, pulse 70, temperature 98.4 F (36.9 C), temperature source Oral, resp. rate 18, height 5\' 6"  (1.676 m), weight 68.4 kg (150 lb 12.8 oz), SpO2 100 %.  PHYSICAL EXAMINATION:  Physical Exam  GENERAL:  83 y.o.-year-old elderly patient  lying in the bed with no acute distress.  EYES: Pupils equal, round, reactive to light and accommodation. No scleral icterus. Extraocular muscles intact.  HEENT: Head atraumatic, normocephalic. Oropharynx and nasopharynx clear.  NECK:  Supple, no jugular venous distention. No thyroid enlargement, no tenderness.  LUNGS: Normal breath sounds bilaterally, no wheezing, rales,rhonchi or crepitation. No use of accessory muscles of respiration. Decreased bibasilar breath sounds  CARDIOVASCULAR: S1, S2 normal. No rubs, or gallops. 3/6 systolic murmur present ABDOMEN: Soft, nontender, nondistended. Bowel sounds present. No organomegaly or mass.  EXTREMITIES: No pedal edema, cyanosis, or clubbing. Left hip lateral thigh dressing noted. NEUROLOGIC: Cranial nerves II through XII are intact. Muscle strength 5/5 in all extremities. Sensation intact. Gait not checked. Global weakness present PSYCHIATRIC: The patient is alert and oriented x 3.  SKIN: No obvious rash, lesion, or ulcer.    LABORATORY PANEL:   CBC  Recent Labs Lab 06/02/17 0432  WBC 18.6*  HGB 9.2*  HCT 28.3*  PLT 193   ------------------------------------------------------------------------------------------------------------------  Chemistries   Recent Labs Lab 06/02/17 0432  NA 140  K 4.6  CL 112*  CO2 22  GLUCOSE 153*  BUN 23*  CREATININE 1.52*  CALCIUM 8.0*  AST 35  ALT 16  ALKPHOS 42  BILITOT 0.6   ------------------------------------------------------------------------------------------------------------------  Cardiac Enzymes No results for input(s): TROPONINI in the last 168 hours. ------------------------------------------------------------------------------------------------------------------  RADIOLOGY:  Dg Chest 1 View  Result Date: 06/01/2017 CLINICAL DATA:  Fall onto left hip. History of COPD, coronary artery disease, GERD, hypertension, MI, smoker. EXAM: CHEST 1 VIEW COMPARISON:  Chest x-ray dated  08/09/2015. FINDINGS: New irregular nodular density within the left upper lung, suprahilar. Lungs appear otherwise  clear. No pleural effusion or pneumothorax seen, although characterization is limited by over penetration. Heart size and mediastinal contours are stable. Atherosclerosis noted at the aortic arch. Degenerative spurring noted at the left shoulder. No osseous fracture or dislocation seen. IMPRESSION: 1. Irregular nodular density within the left upper lung, suprahilar. This is a suspicious finding for which chest CT is recommended. 2. Aortic atherosclerosis. Electronically Signed   By: Franki Cabot M.D.   On: 06/01/2017 10:39   Dg Hip Operative Unilat W Or W/o Pelvis Left  Result Date: 06/01/2017 CLINICAL DATA:  Intertrochanteric fracture LEFT femur post surgery EXAM: OPERATIVE LEFT HIP (WITH PELVIS IF PERFORMED) 4 VIEWS TECHNIQUE: Fluoroscopic spot image(s) were submitted for interpretation post-operatively. FLUOROSCOPY TIME:  1 minutes 24 seconds Images obtained: 4 COMPARISON:  Preoperative images 8 04/24/2017 FINDINGS: Diffuse osseous demineralization. Placement of an IM nail with proximal compression screw across an intertrochanteric fracture of the LEFT femur. Improved alignment of fracture fragments since previous exam. No dislocation seen. Distal locking screw noted at the distal portion of the IM nail. IMPRESSION: Post ORIF of previously identified intertrochanteric LEFT femoral fracture. Electronically Signed   By: Lavonia Dana M.D.   On: 06/01/2017 20:29   Dg Hip Unilat W Or Wo Pelvis 2-3 Views Left  Result Date: 06/01/2017 CLINICAL DATA:  Fall. EXAM: DG HIP (WITH OR WITHOUT PELVIS) 2-3V LEFT COMPARISON:  11/17/2016 . FINDINGS: Degenerative changes lumbar spine and both hips. Comminuted angulated left hip intertrochanteric fracture noted. Peripheral vascular calcification IMPRESSION: 1.  Comminuted, angulated left hip intertrochanteric fracture. 2. Peripheral vascular disease.  Electronically Signed   By: Marcello Moores  Register   On: 06/01/2017 10:38    EKG:   Orders placed or performed during the hospital encounter of 06/01/17  . ED EKG  . ED EKG    ASSESSMENT AND PLAN:   83 year old female with past medical history significant for COPD, hypertension, depression and anxiety came in after mechanical fall and noted to have left intertrochanteric hip fracture.  #1 left intertrochanteric hip fracture-appreciate orthopedics input. Patient had ORIF yesterday. -Postop day 1 today. Continue working with pain mgmt, ice pack needed. -Physical therapy consult and likely will need placement  #2 hypertension-hold off and monitor as blood pressure has been low  #3 COPD-not on home oxygen, currently requiring 3 L. -Encouraged her incentive spirometer -chest x-ray with left upper lobe nodule, follow-up CT when stable or as outpatient. -No indication for systemic steroids. Continue duo nebs and inhalers  #4 leukocytosis-no fevers. Monitor carefully. Urine analysis without any infection -Chest x-ray with no infiltrate. Hold off on antibiotics at this time  #5 GERD- pepcid  #6 DVT prophylaxis- started on Lovenox    All the records are reviewed and case discussed with Care Management/Social Workerr. Management plans discussed with the patient, family and they are in agreement.  CODE STATUS: DNR  TOTAL TIME TAKING CARE OF THIS PATIENT: 37 minutes.   POSSIBLE D/C IN 2-3 DAYS, DEPENDING ON CLINICAL CONDITION.   Maida Widger M.D on 06/02/2017 at 2:34 PM  Between 7am to 6pm - Pager - [phone]  After 6pm go to [email or web address] - password [email or web address] [facility]  Office  [phone]  CC: Primary care physician; Sofie Hartigan, MD

## 2017-06-02 NOTE — Progress Notes (Signed)
BP 82/64 at 0201, only 50 ml output from foley catheter over the last 4 hours. MD called. Fluid rate changed. Will continue to monitor. Will not remove foley until output and BP improves per MD.

## 2017-06-02 NOTE — Progress Notes (Signed)
Physical Therapy Treatment Patient Details Name: Kari Mcdonald MRN: [id number] DOB: 08/08/1932 Today's Date: 06/02/2017    History of Present Illness Pt is a 83 y/o F who presented s/p fall with imaging revealing a closed L intertrochanteric fx.  Pt is now s/p L intertrochanteric IM nail.  Pt's PMH includes skin cancer, COPD, osteoporosis, MI.    PT Comments    [name] is not making progress with her mobility due to pain.  Pt screaming out with each repetition of therapeutic exercise. Pt screaming out about her pain and shouting saying, "It hurts too bad".  Significant amount of time spent educating pt on the importance of mobility and the possible consquences of delaying mobility but pt continues to scream out in pain with any mobility of L hip.  She refuses to complete her therapeutic exercises due to pain. Attempted using relaxation techniques which helped temporarily until L hip was moved.  She currently requires up to max +2 assist for bed mobility and remains unable to clear buttocks from bed with attempt x4 for sit>stand, even when total +2 assist provided.  SNF remains most appropriate d/c plan.   Follow Up Recommendations  SNF     Equipment Recommendations  None recommended by PT    Recommendations for Other Services       Precautions / Restrictions Precautions Precautions: Fall;Other (comment) Precaution Comments: Monitor O2, pt not on O2 at baseline Restrictions Weight Bearing Restrictions: Yes RLE Weight Bearing: Weight bearing as tolerated LLE Weight Bearing: Weight bearing as tolerated Other Position/Activity Restrictions: LLE WBAT    Mobility  Bed Mobility Overal bed mobility: Needs Assistance Bed Mobility: Supine to Sit;Sit to Supine;Rolling Rolling: Mod assist   Supine to sit: Mod assist;HOB elevated Sit to supine: +2 for physical assistance;Max assist   General bed mobility comments: Assist provided to advance LLE to EOB and to elevate trunk.  Pt  uses bed rail with increased time and effort.  To return to supine quickly to get on bed pan +2 max assist provided to guide trunk and to bring LEs into bed.  Transfers Overall transfer level: Needs assistance Equipment used: Rolling walker (2 wheeled) Transfers: Sit to/from Stand Sit to Stand: Total assist;+2 physical assistance;From elevated surface         General transfer comment: On first 3 attempts pt is provided a RW and was provided total assist to attempt standing but pt unable to clear buttocks from bed with total assist +1.  On fourth attempt pt was provided +2 total assist and pt remained unable to clear buttocks from bed.  Pt screaming out in pain immediately on each attempt.    Ambulation/Gait             General Gait Details: Unable to assess at this time   Stairs            Wheelchair Mobility    Modified Rankin (Stroke Patients Only)       Balance Overall balance assessment: Needs assistance Sitting-balance support: Single extremity supported;Feet supported Sitting balance-Leahy Scale: Poor Sitting balance - Comments: Relies on at least 1UE support when sitting EOB   Standing balance support: Bilateral upper extremity supported;During functional activity Standing balance-Leahy Scale: Zero Standing balance comment: Pt unable to achieve standing                            Cognition Arousal/Alertness: Awake/alert Behavior During Therapy: WFL for tasks assessed/performed;Anxious Overall Cognitive  Status: Within Functional Limits for tasks assessed                                 General Comments: Pt screaming out about her pain and shouting saying, "It hurts too bad".  Significant amount of time spent educating pt on the importance of mobility and the possible consquences of delaying mobility but pt continues to scream out in pain with any mobility of L hip.  She refuses to complete her therapeutic exercises due to pain.    Attempted using relaxation techniques which helped temporarily until L hip was moved.      Exercises General Exercises - Lower Extremity Ankle Circles/Pumps: AROM;10 reps;Both;Supine Quad Sets: Strengthening;Both;10 reps;Supine Heel Slides: PROM;Left;10 reps;Supine;Other (comment) (pt screaming out with each repetition) Hip ABduction/ADduction: AAROM;Left;10 reps;Other (comment);Supine (pt screaming out with each repetition) Straight Leg Raises: AAROM;Left;10 reps;Supine;Other (comment) (pt screaming out with each repetition)    General Comments General comments (skin integrity, edema, etc.): nasal canula in place throughout session, no S/S of SOB      Pertinent Vitals/Pain Pain Assessment: Faces Pain Score: 10-Worst pain ever ("the highest part of 10") Faces Pain Scale: Hurts worst Pain Location: L hip Pain Descriptors / Indicators: Aching;Grimacing;Guarding;Moaning (screaming, shouting) Pain Intervention(s): Limited activity within patient's tolerance;Monitored during session;Repositioned;Premedicated before session;Utilized relaxation techniques;Relaxation    Home Living Family/patient expects to be discharged to:: Skilled nursing facility Living Arrangements: Other relatives (lives with grandson) Available Help at Discharge: Family;Available PRN/intermittently (grandson works day/night shifts as EMT, other family local but work) Type of Home: House Home Access: Stairs to enter Entrance Stairs-Rails: None (has railings, but broken per pt report) Home Layout: One level Home Equipment: [place] - 2 wheels;Cane - single point;Bedside commode;Grab bars - toilet;Shower seat      Prior Function Level of Independence: Independent      Comments: Pt ambulates without AD, denies any additional falls in the past 12 months.  Takes baths independently, dresses ind, light meal prep and light cleaning tasks ind. Endorses sometimes getting dizzy with body position changes   PT Goals (current  goals can now be found in the care plan section) Acute Rehab PT Goals Patient Stated Goal: decreased pain PT Goal Formulation: With patient Time For Goal Achievement: 06/16/17 Potential to Achieve Goals: Fair Progress towards PT goals: Not progressing toward goals - comment (due to pain)    Frequency    BID      PT Plan Current plan remains appropriate    Co-evaluation              AM-PAC PT "6 Clicks" Daily Activity  Outcome Measure  Difficulty turning over in bed (including adjusting bedclothes, sheets and blankets)?: Unable Difficulty moving from lying on back to sitting on the side of the bed? : Unable Difficulty sitting down on and standing up from a chair with arms (e.g., wheelchair, bedside commode, etc,.)?: Unable Help needed moving to and from a bed to chair (including a wheelchair)?: Total Help needed walking in hospital room?: Total Help needed climbing 3-5 steps with a railing? : Total 6 Click Score: 6    End of Session Equipment Utilized During Treatment: Gait belt;Oxygen Activity Tolerance: Patient limited by fatigue;Patient limited by pain Patient left: in bed;with call bell/phone within reach;with bed alarm set;with family/visitor present;with SCD's reapplied Nurse Communication: Mobility status PT Visit Diagnosis: Pain;Muscle weakness (generalized) (M62.81);Unsteadiness on feet (R26.81);Difficulty in walking, not elsewhere classified (R26.2)  Pain - Right/Left: Left Pain - part of body: Hip     Time: 4619-0122 PT Time Calculation (min) (ACUTE ONLY): 33 min  Charges:  $Therapeutic Exercise: 8-22 mins $Therapeutic Activity: 8-22 mins                    G Codes:       Collie Siad PT, DPT 06/02/2017, 2:41 PM

## 2017-06-02 NOTE — Clinical Social Work Note (Signed)
Clinical Social Work Assessment  Patient Details  Name: Kari Mcdonald MRN: [id number] Date of Birth: [birth year]  Date of referral:  06/02/17               Reason for consult:  Facility Placement                Permission sought to share information with:  Chartered certified accountant granted to share information::  Yes, Verbal Permission Granted  Name::      [place]::   [place]   Relationship::     Contact Information:     Housing/Transportation Living arrangements for the past 2 months:  [place] of Information:  Patient, Adult Children Patient Interpreter Needed:  None Criminal Activity/Legal Involvement Pertinent to Current Situation/Hospitalization:  No - Comment as needed Significant Relationships:  Adult Children, Other Family Members Lives with:  Other (Comment) (Grandson Kari Mcdonald. ) Do you feel safe going back to the place where you live?  Yes Need for family participation in patient care:  Yes (Comment)  Care giving concerns:  Patient lives in [place] on the [place] side with her grandson Kari Mcdonald.    Social Worker assessment / plan:  Holiday representative (CSW) received SNF consult. PT is recommending SNF. CSW met with patient and her son Kari Mcdonald and daughter in law Kari Mcdonald were at bedside. Patient was alert and oriented X4 and was laying in the bed. CSW introduced self and explained role of CSW department. Patient reported that she lives in [place] with her grandson Kari Mcdonald. Per patient Kari Mcdonald works during the day. Patient reported that her son Kari Mcdonald is her HPOA and helps make decisions for her. CSW explained SNF process. Patient and son are agreeable to SNF search in [place]. FL2 complete and faxed out.   CSW presented bed offers to patient and son. They chose [facility]. Plan is for patient to D/C to [facility] when medically stable. [place] admissions coordinator at [facility] is aware of above. CSW  will continue to follow and assist as needed.   Employment status:  Retired Nurse, adult PT Recommendations:  [place] / Referral to community resources:  [place]  Patient/Family's Response to care:  Patient and son are agreeable for patient to go to [facility].   Patient/Family's Understanding of and Emotional Response to Diagnosis, Current Treatment, and Prognosis:  Patient and her son were very pleasant and thanked CSW for assistance.   Emotional Assessment Appearance:  Appears stated age Attitude/Demeanor/Rapport:    Affect (typically observed):  Accepting, Adaptable, Pleasant Orientation:  Oriented to Self, Oriented to Place, Oriented to  Time, Oriented to Situation Alcohol / Substance use:  Not Applicable Psych involvement (Current and /or in the community):  No (Comment)  Discharge Needs  Concerns to be addressed:  Discharge Planning Concerns Readmission within the last 30 days:  No Current discharge risk:  Dependent with Mobility Barriers to Discharge:  Continued Medical Work up   [employer], [place] Beets, LCSW 06/02/2017, 1:52 PM

## 2017-06-02 NOTE — Clinical Social Work Placement (Signed)
   CLINICAL SOCIAL WORK PLACEMENT  NOTE  Date:  06/02/2017  Patient Details  Name: Kari Mcdonald MRN: [id number] Date of Birth: [birth year]  Clinical Social Work is seeking post-discharge placement for this patient at the [place] level of care (*CSW will initial, date and re-position this form in  chart as items are completed):  Yes   Patient/family provided with [facility] Work Department's list of facilities offering this level of care within the geographic area requested by the patient (or if unable, by the patient's family).  Yes   Patient/family informed of their freedom to choose among providers that offer the needed level of care, that participate in Medicare, Medicaid or managed care program needed by the patient, have an available bed and are willing to accept the patient.  Yes   Patient/family informed of [facility]'s ownership interest in [facility] and [facility], as well as of the fact that they are under no obligation to receive care at these facilities.  PASRR submitted to EDS on 06/01/17     PASRR number received on 06/01/17     Existing PASRR number confirmed on       FL2 transmitted to all facilities in geographic area requested by pt/family on 06/01/17     FL2 transmitted to all facilities within larger geographic area on       Patient informed that his/her managed care company has contracts with or will negotiate with certain facilities, including the following:        Yes   Patient/family informed of bed offers received.  Patient chooses bed at  [facility] )     Physician recommends and patient chooses bed at      Patient to be transferred to   on  .  Patient to be transferred to facility by       Patient family notified on   of transfer.  Name of family member notified:        PHYSICIAN       Additional Comment:    _______________________________________________ Orla Estrin, [place] Beets,  LCSW 06/02/2017, 1:51 PM

## 2017-06-02 NOTE — Evaluation (Signed)
Physical Therapy Evaluation Patient Details Name: Kari Mcdonald MRN: [id number] DOB: [birth year] Today's Date: 06/02/2017   History of Present Illness  Pt is a 83 y/o F who presented s/p fall with imaging revealing a closed L intertrochanteric fx.  Pt is now s/p L intertrochanteric IM nail.  Pt's PMH includes skin cancer, COPD, osteoporosis, MI.    Clinical Impression  Patient is s/p above surgery resulting in functional limitations due to the deficits listed below (see PT Problem List). Kari Mcdonald presents with L hip pain and weakness which greatly limits her mobility today.  She requires mod assist to achieve sitting EOB and despite providing +2 total assist pt is still unable to clear buttocks from bed to stand.  SpO2 drops to 84% on RA with bed mobility.  Pt has several steps to enter her home and have very limited assist available at d/c.  Given pt's current mobility status, recommending SNF at d/c.   Patient will benefit from skilled PT to increase their independence and safety with mobility to allow discharge to the venue listed below.      Follow Up Recommendations SNF    Equipment Recommendations  None recommended by PT    Recommendations for Other Services OT consult     Precautions / Restrictions Precautions Precautions: Fall;Other (comment) Precaution Comments: Monitor O2, pt not on O2 at baseline Restrictions Weight Bearing Restrictions: Yes RLE Weight Bearing: Weight bearing as tolerated      Mobility  Bed Mobility Overal bed mobility: Needs Assistance Bed Mobility: Supine to Sit;Sit to Supine     Supine to sit: Mod assist;HOB elevated Sit to supine: Mod assist;+2 for physical assistance   General bed mobility comments: Assist provided to advance LLE to EOB.  Pt uses bed rail with increased time and effort.  To return to supine quickly to get on bed pan +2 mod assist provided to guide trunk and to bring LEs into bed.  Transfers Overall transfer level:  Needs assistance Equipment used: Rolling walker (2 wheeled);2 person hand held assist Transfers: Sit to/from Stand Sit to Stand: Total assist;+2 physical assistance;From elevated surface         General transfer comment: Cues provided for proper hand placement and foot placement for greater mechanical advantage.  On first attempt RW in place and assist provided to boost up but pt unable to clear buttocks from bed.  On second attempt NT and this PT provided assist to boost to standing without RW but pt again was unable to clear buttocks from bed.  Bed elevated on both attempts.    Ambulation/Gait             General Gait Details: Unable to assess at this time  Stairs            Wheelchair Mobility    Modified Rankin (Stroke Patients Only)       Balance Overall balance assessment: Needs assistance Sitting-balance support: Single extremity supported;Feet supported Sitting balance-Leahy Scale: Poor Sitting balance - Comments: Relies on at least 1UE support when sitting EOB   Standing balance support: Bilateral upper extremity supported;During functional activity Standing balance-Leahy Scale: Zero Standing balance comment: Pt unable to achieve standing                             Pertinent Vitals/Pain Pain Assessment: Faces Faces Pain Scale: Hurts whole lot Pain Location: L hip Pain Descriptors / Indicators: Aching;Grimacing;Guarding;Moaning Pain Intervention(s): Limited activity within  patient's tolerance;Monitored during session    Home Living Family/patient expects to be discharged to:: Skilled nursing facility Living Arrangements: Other relatives Kari Mcdonald) Available Help at Discharge: Family;Available PRN/intermittently (Grandson works day and night shifts as EMS) Type of Home: House Home Access: Stairs to enter [employer]: None (Has railing but are broken at both entrances) Entrance Stairs-Number of Steps: 5 Home Layout: One  level Home Equipment: [place] - 2 wheels;Cane - single point;Bedside commode;Grab bars - toilet;Shower seat      Prior Function Level of Independence: Independent         Comments: Pt ambulates without AD, denies any falls in the past 6 months.  Takes baths independently, dresses ind, cooking and cleaning ind.       Hand Dominance        Extremity/Trunk Assessment   Upper Extremity Assessment Upper Extremity Assessment: Overall WFL for tasks assessed    Lower Extremity Assessment Lower Extremity Assessment: LLE deficits/detail LLE Deficits / Details: Strength grossly 2/5 limited by pain and weakness LLE: Unable to fully assess due to pain    Cervical / Trunk Assessment Cervical / Trunk Assessment: Kyphotic  Communication   Communication: HOH  Cognition Arousal/Alertness: Awake/alert Behavior During Therapy: WFL for tasks assessed/performed Overall Cognitive Status: Within Functional Limits for tasks assessed                                        General Comments General comments (skin integrity, edema, etc.): SpO2 down to 84% on RA with bed mobility, back up to 93% on 2L O2.    Exercises General Exercises - Lower Extremity Ankle Circles/Pumps: AROM;10 reps;Both;Supine Quad Sets: Strengthening;Both;10 reps;Supine Straight Leg Raises: AAROM;Left;10 reps;Supine   Assessment/Plan    PT Assessment Patient needs continued PT services  PT Problem List Decreased strength;Decreased range of motion;Decreased activity tolerance;Decreased balance;Decreased mobility;Decreased knowledge of use of DME;Decreased safety awareness;Pain;Cardiopulmonary status limiting activity       PT Treatment Interventions DME instruction;Gait training;Stair training;Functional mobility training;Therapeutic activities;Therapeutic exercise;Balance training;Neuromuscular re-education;Patient/family education;Wheelchair mobility training;Modalities    PT Goals (Current goals can  be found in the Care Plan section)  Acute Rehab PT Goals Patient Stated Goal: to get stronger PT Goal Formulation: With patient Time For Goal Achievement: 06/16/17 Potential to Achieve Goals: Fair    Frequency BID   Barriers to discharge Inaccessible home environment;Decreased caregiver support Steps without rails to enter home and very limited assist available at d/c    Co-evaluation               AM-PAC PT "6 Clicks" Daily Activity  Outcome Measure Difficulty turning over in bed (including adjusting bedclothes, sheets and blankets)?: Unable Difficulty moving from lying on back to sitting on the side of the bed? : Unable Difficulty sitting down on and standing up from a chair with arms (e.g., wheelchair, bedside commode, etc,.)?: Unable Help needed moving to and from a bed to chair (including a wheelchair)?: Total Help needed walking in hospital room?: Total Help needed climbing 3-5 steps with a railing? : Total 6 Click Score: 6    End of Session Equipment Utilized During Treatment: Gait belt;Oxygen Activity Tolerance: Patient limited by fatigue;Patient limited by pain Patient left: in bed;with call bell/phone within reach;with bed alarm set;Other (comment) (pt on bed pan, to call nursing staff when done) Nurse Communication: Mobility status;Other (comment) (SpO2) PT Visit Diagnosis: Pain;Muscle weakness (generalized) (M62.81);Unsteadiness on  feet (R26.81);Difficulty in walking, not elsewhere classified (R26.2) Pain - Right/Left: Left Pain - part of body: Hip    Time: 0927-0957 PT Time Calculation (min) (ACUTE ONLY): 30 min   Charges:   PT Evaluation $PT Eval Low Complexity: 1 Low PT Treatments $Therapeutic Activity: 8-22 mins   PT G Codes:   PT G-Codes **NOT FOR INPATIENT CLASS** Functional Assessment Tool Used: AM-PAC 6 Clicks Basic Mobility;Clinical judgement Functional Limitation: Mobility: Walking and moving around Mobility: Walking and Moving Around Current  Status (E0063): 100 percent impaired, limited or restricted Mobility: Walking and Moving Around Goal Status (G9494): At least 60 percent but less than 80 percent impaired, limited or restricted    Collie Siad PT, DPT 06/02/2017, 10:20 AM

## 2017-06-02 NOTE — Evaluation (Signed)
Occupational Therapy Evaluation Patient Details Name: Kari Mcdonald MRN: [id number] DOB: 01/30/1932 Today's Date: 06/02/2017    History of Present Illness Pt is a 83 y/o F who presented s/p fall with imaging revealing a closed L intertrochanteric fx.  Pt is now s/p L intertrochanteric IM nail.  Pt's PMH includes skin cancer, COPD, osteoporosis, MI.   Clinical Impression   Pt seen for OT evaluation this date. Pt was independent at baseline, except for driving, which her daughter, son, or other family member/friend helps take her places. Pt does not endorse any additional falls aside from the fall that led to her L hip fracture (where she slipped on bottom step outside because it was slippery after grandson recently moved grass). She does endorse sometimes getting dizzy/lightheaded with body position changes (bending over, sitting up in bed). Pt/daughter educated in body positioning and environmental positioning to minimize need to bend over and in order to maximize safety. Pt/daughter verbalized understanding. Pt/daughter educated in use of AE for LB ADL tasks, unable to trial use due to significant pain throughout session. Pt currently limited by significant pain, decreased strength, impaired activity tolerance, and decreased knowledge of AE/DME. Pt will benefit from skilled OT services to address noted impairments and functional deficits in order to maximize return to PLOF and minimize future risk of falls/injury/rehospitalization/increased caregiver burden. Recommend STR following hospitalization.    Follow Up Recommendations  SNF    Equipment Recommendations  Other (comment) (grab bars to be installed in shower)    Recommendations for Other Services       Precautions / Restrictions Precautions Precautions: Fall;Other (comment) Precaution Comments: Monitor O2, pt not on O2 at baseline Restrictions Weight Bearing Restrictions: Yes LLE Weight Bearing: Weight bearing as  tolerated Other Position/Activity Restrictions: LLE WBAT      Mobility Bed Mobility Overal bed mobility: Needs Assistance   General bed mobility comments: pt deferred due to significant pain  Transfers Overall transfer level: Needs assistance         General transfer comment: pt deferred due to significant pain    Balance Overall balance assessment: Needs assistance                           ADL either performed or assessed with clinical judgement   ADL Overall ADL's : Needs assistance/impaired Eating/Feeding: Bed level;Set up   Grooming: Bed level;Set up   Upper Body Bathing: Bed level;Set up   Lower Body Bathing: Bed level;Moderate assistance;Maximal assistance   Upper Body Dressing : Bed level;Minimal assistance   Lower Body Dressing: Bed level;Moderate assistance;Maximal assistance     Toilet Transfer Details (indicate cue type and reason): deferred due to safety/pain           General ADL Comments: pt generally mod-max assist for LB ADL tasks, significantly limited by pain this date     Vision Baseline Vision/History: Wears glasses Wears Glasses: At all times Patient Visual Report: No change from baseline Vision Assessment?: No apparent visual deficits     Perception     Praxis      Pertinent Vitals/Pain Pain Assessment: 0-10 Pain Score: 10-Worst pain ever ("the highest part of 10") Faces Pain Scale: Hurts whole lot Pain Location: L hip at rest Pain Descriptors / Indicators: Aching;Grimacing;Guarding;Moaning Pain Intervention(s): Limited activity within patient's tolerance;Monitored during session     Hand Dominance     Extremity/Trunk Assessment Upper Extremity Assessment Upper Extremity Assessment: Generalized weakness (grossly 4/5 bilaterally, except  L shoulder flexion 3+/5 due to arthritis pain)   Lower Extremity Assessment Lower Extremity Assessment: Defer to PT evaluation;LLE deficits/detail LLE Deficits / Details:  Strength grossly 2/5 limited by pain and weakness LLE: Unable to fully assess due to pain   Cervical / Trunk Assessment Cervical / Trunk Assessment: Kyphotic   Communication Communication Communication: HOH   Cognition Arousal/Alertness: Awake/alert Behavior During Therapy: WFL for tasks assessed/performed Overall Cognitive Status: Within Functional Limits for tasks assessed                                     General Comments  nasal canula in place throughout session, no S/S of SOB    Exercises Other Exercises Other Exercises: Pt/daughter educated in AE for LB ADL tasks to minimize risk of falls and maximize functional independence. Pt/dtr verbalized understanding. Other Exercises: Pt/daughter educated in home/routines modifications to minimize need for bending over and minimizing risk of dizziness to maximize safety and functional independence. Pt/dtr verbalized understanding.   Shoulder Instructions      Home Living Family/patient expects to be discharged to:: Skilled nursing facility Living Arrangements: Other relatives (lives with grandson) Available Help at Discharge: Family;Available PRN/intermittently (grandson works day/night shifts as EMT, other family local but work) Type of Home: House Home Access: Stairs to enter [employer]wer of Steps: 5 Entrance Stairs-Rails: None (has railings, but broken per pt report) Home Layout: One level     Bathroom Shower/Tub: Tub/shower unit         Home Equipment: Environmental consultant - 2 wheels;Cane - single point;Bedside commode;Grab bars - toilet;Shower seat          Prior Functioning/Environment Level of Independence: Independent        Comments: Pt ambulates without AD, denies any additional falls in the past 12 months.  Takes baths independently, dresses ind, light meal prep and light cleaning tasks ind. Endorses sometimes getting dizzy with body position changes        OT Problem List: Decreased  strength;Pain;Decreased safety awareness;Decreased activity tolerance;Decreased knowledge of use of DME or AE;Impaired balance (sitting and/or standing)      OT Treatment/Interventions: Self-care/ADL training;Therapeutic activities;DME and/or AE instruction;Patient/family education;Therapeutic exercise    OT Goals(Current goals can be found in the care plan section) Acute Rehab OT Goals Patient Stated Goal: to get stronger OT Goal Formulation: With patient Time For Goal Achievement: 06/16/17 Potential to Achieve Goals: Good  OT Frequency: Min 2X/week   Barriers to D/C: Inaccessible home environment;Decreased caregiver support          Co-evaluation              AM-PAC PT "6 Clicks" Daily Activity     Outcome Measure Help from another person eating meals?: None Help from another person taking care of personal grooming?: None Help from another person toileting, which includes using toliet, bedpan, or urinal?: Total Help from another person bathing (including washing, rinsing, drying)?: A Lot Help from another person to put on and taking off regular upper body clothing?: A Little Help from another person to put on and taking off regular lower body clothing?: A Lot 6 Click Score: 16   End of Session    Activity Tolerance: Patient limited by pain Patient left: in bed;with call bell/phone within reach;with bed alarm set;with family/visitor present;with SCD's reapplied  OT Visit Diagnosis: Other abnormalities of gait and mobility (R26.89);Pain;Muscle weakness (generalized) (M62.81) Pain - Right/Left: Left  Pain - part of body: Hip                Time: 1142-1200 OT Time Calculation (min): 18 min Charges:  OT General Charges $OT Visit: 1 Visit OT Evaluation $OT Eval Moderate Complexity: 1 Mod G-Codes: OT G-codes **NOT FOR INPATIENT CLASS** Functional Assessment Tool Used: AM-PAC 6 Clicks Daily Activity;Clinical judgement Functional Limitation: Self care Self Care Current  Status (B5208): At least 60 percent but less than 80 percent impaired, limited or restricted Self Care Goal Status (Y2233): At least 40 percent but less than 60 percent impaired, limited or restricted   Jeni Salles, MPH, MS, OTR/L ascom [phone] 06/02/17, 1:24 PM

## 2017-06-03 ENCOUNTER — Inpatient Hospital Stay: Payer: Medicare Other

## 2017-06-03 LAB — BASIC METABOLIC PANEL
Anion gap: 2 — ABNORMAL LOW (ref 5–15)
BUN: 17 mg/dL (ref 6–20)
CHLORIDE: 110 mmol/L (ref 101–111)
CO2: 24 mmol/L (ref 22–32)
CREATININE: 1.05 mg/dL — AB (ref 0.44–1.00)
Calcium: 8.6 mg/dL — ABNORMAL LOW (ref 8.9–10.3)
GFR calc non Af Amer: 48 mL/min — ABNORMAL LOW (ref >60)
GFR, EST AFRICAN AMERICAN: 55 mL/min — AB (ref >60)
Glucose, Bld: 103 mg/dL — ABNORMAL HIGH (ref 65–99)
POTASSIUM: 4.5 mmol/L (ref 3.5–5.1)
SODIUM: 136 mmol/L (ref 135–145)

## 2017-06-03 LAB — CBC
HCT: 22.8 % — ABNORMAL LOW (ref 35.0–47.0)
HEMOGLOBIN: 7.6 g/dL — AB (ref 12.0–16.0)
MCH: 28.7 pg (ref 26.0–34.0)
MCHC: 33.1 g/dL (ref 32.0–36.0)
MCV: 86.8 fL (ref 80.0–100.0)
PLATELETS: 130 10*3/uL — AB (ref 150–440)
RBC: 2.63 MIL/uL — AB (ref 3.80–5.20)
RDW: 15.1 % — ABNORMAL HIGH (ref 11.5–14.5)
WBC: 9.8 10*3/uL (ref 3.6–11.0)

## 2017-06-03 LAB — HEMOGLOBIN
HEMOGLOBIN: 7.8 g/dL — AB (ref 12.0–16.0)
HEMOGLOBIN: 7.7 g/dL — AB (ref 12.0–16.0)

## 2017-06-03 MED ORDER — SENNA 8.6 MG PO TABS
1.0000 | ORAL_TABLET | Freq: Two times a day (BID) | ORAL | Status: DC
  Administered 2017-06-03 – 2017-06-04 (×3): 8.6 mg via ORAL
  Filled 2017-06-03 (×3): qty 1

## 2017-06-03 MED ORDER — ENOXAPARIN SODIUM 40 MG/0.4ML ~~LOC~~ SOLN
40.0000 mg | SUBCUTANEOUS | Status: DC
  Administered 2017-06-04: 40 mg via SUBCUTANEOUS
  Filled 2017-06-03: qty 0.4

## 2017-06-03 MED ORDER — GUAIFENESIN ER 600 MG PO TB12
600.0000 mg | ORAL_TABLET | Freq: Two times a day (BID) | ORAL | Status: DC
  Administered 2017-06-03 – 2017-06-04 (×3): 600 mg via ORAL
  Filled 2017-06-03 (×3): qty 1

## 2017-06-03 MED ORDER — METHOCARBAMOL 500 MG PO TABS
500.0000 mg | ORAL_TABLET | Freq: Three times a day (TID) | ORAL | Status: DC
  Administered 2017-06-03 – 2017-06-04 (×4): 500 mg via ORAL
  Filled 2017-06-03 (×4): qty 1

## 2017-06-03 NOTE — Progress Notes (Signed)
Clinical Social Worker (CSW) met with patient's son Chrissie Noa at his request. Chrissie Noa inquired about long term care placement for patient. CSW explained that patient's insurance [facility] medicare will pay for short term rehab only. CSW explained to son how to apply for long term care medicaid at the department of social services (DSS) at [facility]. CSW gave son contact information for the DSS office. Son verbalized his understanding.   [employer], LCSW [phone]

## 2017-06-03 NOTE — Progress Notes (Signed)
Per MD patient may be medically stable for D/C tomorrow. Plan is for patient to D/C to [facility] SNF. [place] admissions coordinator at [facility] is aware of above. Clinical Social Worker (CSW) will continue to follow and assist as needed.   [employer], LCSW [phone]

## 2017-06-03 NOTE — Progress Notes (Signed)
Physical Therapy Treatment Patient Details Name: Kari Mcdonald MRN: [id number] DOB: 09-01-1932 Today's Date: 06/03/2017    History of Present Illness Pt is a 83 y/o F who presented s/p fall with imaging revealing a closed L intertrochanteric fx.  Pt is now s/p L intertrochanteric IM nail.  Pt's PMH includes skin cancer, COPD, osteoporosis, MI.    PT Comments    Pt pre-medicated with pain meds for PT session and pt appearing to tolerate session better today.  Pt able to sit on edge of bed with 1 assist but pt unable to stand or clear bottom from bed using RW (pt requesting to go to commode for toileting but d/t assist and safety concerns, pt assisted back to supine with 2 assist to use bed pan).  Will continue to progress pt with strengthening and progressive functional mobility per pt tolerance.    Follow Up Recommendations  SNF     Equipment Recommendations  Rolling walker with 5" wheels    Recommendations for Other Services OT consult     Precautions / Restrictions Precautions Precautions: Fall Restrictions Weight Bearing Restrictions: Yes RLE Weight Bearing: Weight bearing as tolerated LLE Weight Bearing: Weight bearing as tolerated    Mobility  Bed Mobility Overal bed mobility: Needs Assistance Bed Mobility: Supine to Sit;Sit to Supine Rolling: Mod assist   Supine to sit: Mod assist;HOB elevated Sit to supine: Mod assist;+2 for physical assistance   General bed mobility comments: assist for trunk and L LE supine to sit; 2 assist for trunk and B LE's sit to supine; assist to logroll to L and R for bedpan use (toileting) and clean-up  Transfers Overall transfer level: Needs assistance Equipment used: Rolling walker (2 wheeled) Transfers: Sit to/from Stand Sit to Stand: Total assist;+2 physical assistance         General transfer comment: x2 trials with RW but pt unable to clear bottom from bed either attempts and pt needing to urinate so deferred attempt to  transfer to commode and assisted pt back into supine  Ambulation/Gait             General Gait Details: Unable to assess at this time   Stairs            Wheelchair Mobility    Modified Rankin (Stroke Patients Only)       Balance Overall balance assessment: Needs assistance Sitting-balance support: Bilateral upper extremity supported;Feet supported Sitting balance-Leahy Scale: Poor Sitting balance - Comments: static sitting with single UE support SBA       Standing balance comment: Unable to achieve standing to assess                            Cognition Arousal/Alertness: Awake/alert Behavior During Therapy: WFL for tasks assessed/performed;Anxious Overall Cognitive Status: Within Functional Limits for tasks assessed                                        Exercises Total Joint Exercises Ankle Circles/Pumps: AROM;Strengthening;10 reps;Supine Short Arc Quad: AAROM;Strengthening;Left;10 reps;Supine Heel Slides: AAROM;Strengthening;Left;10 reps;Supine Hip ABduction/ADduction: AAROM;Strengthening;Left;10 reps;Supine  Vc's required for technique for above exercises.    General Comments General comments (skin integrity, edema, etc.): No nasal cannula in place upon PT entering room.  Nursing cleared pt for participation in physical therapy (pt pre-medicated with pain meds for PT session).  Pt agreeable  to PT session.      Pertinent Vitals/Pain Pain Assessment: Faces Faces Pain Scale: Hurts even more (6/10 with mobility; 2/10 at rest) Pain Descriptors / Indicators: Aching;Grimacing;Guarding Pain Intervention(s): Limited activity within patient's tolerance;Monitored during session;Premedicated before session;Repositioned    Home Living                      Prior Function            PT Goals (current goals can now be found in the care plan section) Acute Rehab PT Goals Patient Stated Goal: decrease pain PT Goal  Formulation: With patient Time For Goal Achievement: 06/16/17 Potential to Achieve Goals: Fair Progress towards PT goals: Progressing toward goals (only one vocalization noted during session regarding pain (supine to sit))    Frequency    BID      PT Plan Current plan remains appropriate    Co-evaluation              AM-PAC PT "6 Clicks" Daily Activity  Outcome Measure  Difficulty turning over in bed (including adjusting bedclothes, sheets and blankets)?: Unable Difficulty moving from lying on back to sitting on the side of the bed? : Unable Difficulty sitting down on and standing up from a chair with arms (e.g., wheelchair, bedside commode, etc,.)?: Unable Help needed moving to and from a bed to chair (including a wheelchair)?: Total Help needed walking in hospital room?: Total Help needed climbing 3-5 steps with a railing? : Total 6 Click Score: 6    End of Session Equipment Utilized During Treatment: Gait belt Activity Tolerance: Patient limited by fatigue;Patient limited by pain Patient left: in bed;with call bell/phone within reach;with bed alarm set;with family/visitor present (B heels elevated via pillows; no SCD's in place upon PT entering room (discussed with nursing who reported she would check on this)) Nurse Communication: Mobility status;Precautions;Weight bearing status (Pt's pain) PT Visit Diagnosis: Pain;Muscle weakness (generalized) (M62.81);Unsteadiness on feet (R26.81);Difficulty in walking, not elsewhere classified (R26.2);Other abnormalities of gait and mobility (R26.89) Pain - Right/Left: Left Pain - part of body: Hip     Time: 2878-6767 PT Time Calculation (min) (ACUTE ONLY): 26 min  Charges:  $Therapeutic Exercise: 8-22 mins $Therapeutic Activity: 8-22 mins                    G CodesLeitha Bleak, PT 06/03/17, 1:28 PM [phone]

## 2017-06-03 NOTE — Progress Notes (Signed)
Patient ordered lovenox 30mg  daily. Pt Crcl >88ml/min and body weight >45kg, therefore dose increased to 40mg  daily per protocol  Ramond Dial, Pharm.D, BCPS Clinical Pharmacist

## 2017-06-03 NOTE — Progress Notes (Signed)
   Subjective: 2 Days Post-Op Procedure(s) (LRB): INTRAMEDULLARY (IM) NAIL INTERTROCHANTRIC (Left) Patient reports pain as mild, States her breathing has improved Patient is well, and has had no acute complaints or problems Denies any CP, SOB, ABD pain. We will continue therapy today.  Plan is to go Skilled nursing facility after hospital stay.  Objective: Vital signs in last 24 hours: Temp:  [97.8 F (36.6 C)-98.4 F (36.9 C)] 97.8 F (36.6 C) (08/29 0254) Pulse Rate:  [70-80] 74 (08/29 0254) Resp:  [18] 18 (08/29 0254) BP: (92-131)/(36-64) 131/44 (08/29 0254) SpO2:  [96 %-100 %] 96 % (08/29 0254)  Intake/Output from previous day: 08/28 0701 - 08/29 0700 In: 600 [P.O.:600] Out: 450 [Urine:450] Intake/Output this shift: No intake/output data recorded.   Recent Labs  06/01/17 0910 06/01/17 2010 06/02/17 0432 06/03/17 0523  HGB 13.5 10.6* 9.2* 7.6*    Recent Labs  06/02/17 0432 06/03/17 0523  WBC 18.6* 9.8  RBC 3.21* 2.63*  HCT 28.3* 22.8*  PLT 193 130*    Recent Labs  06/02/17 0432 06/03/17 0523  NA 140 136  K 4.6 4.5  CL 112* 110  CO2 22 24  BUN 23* 17  CREATININE 1.52* 1.05*  GLUCOSE 153* 103*  CALCIUM 8.0* 8.6*   No results for input(s): LABPT, INR in the last 72 hours.  EXAM General - Patient is Alert, Appropriate and Oriented Extremity - Neurovascular intact Sensation intact distally Intact pulses distally Dorsiflexion/Plantar flexion intact No cellulitis present Compartment soft Dressing - dressing C/D/I and scant drainage. Dressing changed Motor Function - intact, moving foot and toes well on exam.   Past Medical History:  Diagnosis Date  . Anemia   . Arthritis    left knee  . Cancer ([place])    Skin Cancer  . COPD (chronic obstructive pulmonary disease) ([place])   . Coronary artery disease   . Depression   . Diverticulosis   . Dysphagia   . Dyspnea   . GERD (gastroesophageal reflux disease)   . GI bleed   . Headache    from  eye strain  . History of hiatal hernia   . HOH (hard of hearing)   . Hyperlipidemia   . Hypertension   . Myocardial infarction ([place])    2006  . Osteoporosis   . Wears dentures    full upper and lower    Assessment/Plan:   2 Days Post-Op Procedure(s) (LRB): INTRAMEDULLARY (IM) NAIL INTERTROCHANTRIC (Left) Active Problems:   Closed left hip fracture (H[facility])   Acute post op blood loss anemia   Estimated body mass index is 24.34 kg/m as calculated from the following:   Height as of this encounter: 5\' 6"  (1.676 m).   Weight as of this encounter: 68.4 kg (150 lb 12.8 oz). Advance diet Up with therapy  Acute post op blood loss anemia - Hgb 7.6. Continue with Iron. Recheck Hgb this afternoon. Consider transfusion if Hgb trending down. CM to assist with discharge to SNF  Will need follow up with [facility] in 6 weeks Staples to be removed and steri strips applied on 06/15/17 Continue with Lovenox 40 mg daily x 14 days   DVT Prophylaxis - Lovenox, Foot Pumps and TED hose Weight-Bearing as tolerated to left leg   T. Rachelle Hora, PA-C [facility] 06/03/2017, 7:31 AM

## 2017-06-03 NOTE — Progress Notes (Signed)
Took over care at 1600. A&O x4. VSS. Family at bedside. NSL in place to RFA. Bed alarm on for safety.

## 2017-06-03 NOTE — Progress Notes (Signed)
Physical Therapy Treatment Patient Details Name: Kari Mcdonald MRN: [id number] DOB: 28-Nov-1931 Today's Date: 06/03/2017    History of Present Illness Pt is a 83 y/o F who presented s/p fall with imaging revealing a closed L intertrochanteric fx.  Pt is now s/p L intertrochanteric IM nail.  Pt's PMH includes skin cancer, COPD, osteoporosis, MI.    PT Comments    Pt reporting 10/10 L hip pain with activity during session but 6/10 L hip pain end of session (pt pre-medicated with pain meds for session).  Focused on partial standing with elevated bed to attempt clearing pt's bottom from bed (able to minimally partially clear bottom with significant assist from therapist).  Pt appearing very anxious with activity and verbalizing concerns regarding falling (pt educated on safety precautions being maintained to optimize pt's safety).  Will continue to progress pt with strengthening and progressive standing activities to improve functional mobility.   Follow Up Recommendations  SNF     Equipment Recommendations  Rolling walker with 5" wheels    Recommendations for Other Services OT consult     Precautions / Restrictions Precautions Precautions: Fall Restrictions Weight Bearing Restrictions: Yes LLE Weight Bearing: Weight bearing as tolerated    Mobility  Bed Mobility Overal bed mobility: Needs Assistance Bed Mobility: Supine to Sit;Sit to Supine   Supine to sit: Mod assist;HOB elevated Sit to supine: Mod assist;+2 for physical assistance   General bed mobility comments: assist for trunk and L LE supine to sit; assist for trunk and B LE's sit to supine (2 assist to scoot up in bed)  Transfers Overall transfer level: Needs assistance Equipment used: Rolling walker (2 wheeled) Transfers: Sit to/from Stand Sit to Stand: Total assist;From elevated surface         General transfer comment: Performed x3 trials attempting to stand lifting pt's bottom partially off of bed with  bed elevated and assist of therapist (able to minimally partially clear bottom)  Ambulation/Gait             General Gait Details: Unable to assess at this time   Stairs            Wheelchair Mobility    Modified Rankin (Stroke Patients Only)       Balance Overall balance assessment: Needs assistance Sitting-balance support: Bilateral upper extremity supported;Feet supported Sitting balance-Leahy Scale: Poor Sitting balance - Comments: static sitting with single UE support SBA       Standing balance comment: Unable to achieve standing to assess                            Cognition Arousal/Alertness: Awake/alert Behavior During Therapy: Anxious Overall Cognitive Status: Within Functional Limits for tasks assessed                                        Exercises     General Comments General comments (skin integrity, edema, etc.): Nursing cleared pt for participation in PT and pre-medicated pt with pain meds for session.  Pt agreeable to PT session (pt's son in room but standing by door during session).  Pt's son stepped out into hallway to talk to therapist end of session and was verbalizing concerns regarding his mother's willingness to participate d/t pain and was encouraging therapist to do as much as possible (that was appropriate) and pt's son stated  that he was ok (and his sister would be ok with it too) if it meant that the pt would yell out in pain.  Therapist educated pt's son that therapy would progress pt as appropriate (and based on her tolerance to therapy activities).      Pertinent Vitals/Pain Pain Assessment: 0-10 Pain Score: 6 (L hip) Pain Descriptors / Indicators: Aching;Grimacing;Guarding Pain Intervention(s): Limited activity within patient's tolerance;Monitored during session;Premedicated before session;Repositioned;Utilized relaxation techniques    Home Living                      Prior Function             PT Goals (current goals can now be found in the care plan section) Acute Rehab PT Goals Patient Stated Goal: decrease pain PT Goal Formulation: With patient Time For Goal Achievement: 06/16/17 Potential to Achieve Goals: Fair Progress towards PT goals: Progressing toward goals    Frequency    BID      PT Plan Current plan remains appropriate    Co-evaluation              AM-PAC PT "6 Clicks" Daily Activity  Outcome Measure  Difficulty turning over in bed (including adjusting bedclothes, sheets and blankets)?: Unable Difficulty moving from lying on back to sitting on the side of the bed? : Unable Difficulty sitting down on and standing up from a chair with arms (e.g., wheelchair, bedside commode, etc,.)?: Unable Help needed moving to and from a bed to chair (including a wheelchair)?: Total Help needed walking in hospital room?: Total Help needed climbing 3-5 steps with a railing? : Total 6 Click Score: 6    End of Session Equipment Utilized During Treatment: Gait belt Activity Tolerance: Patient limited by fatigue;Patient limited by pain Patient left: in bed;with call bell/phone within reach;with bed alarm set;with family/visitor present (R LE SCD applied (no L LE SCD noted; this was discussed with nursing this morning and nurse re-notified); B heels elevated via towel rolls) Nurse Communication: Mobility status;Precautions;Weight bearing status PT Visit Diagnosis: Pain;Muscle weakness (generalized) (M62.81);Unsteadiness on feet (R26.81);Difficulty in walking, not elsewhere classified (R26.2);Other abnormalities of gait and mobility (R26.89) Pain - Right/Left: Left Pain - part of body: Hip     Time: 1425-1450 PT Time Calculation (min) (ACUTE ONLY): 25 min  Charges:  $Therapeutic Activity: 23-37 mins                    G CodesLeitha Bleak, PT 06/03/17, 4:16 PM [phone]

## 2017-06-03 NOTE — Progress Notes (Signed)
[facility] [place] NAME: Kari Mcdonald    MR#:  [id number]  DATE OF BIRTH:  1932-07-24  SUBJECTIVE:  CHIEF COMPLAINT:   Chief Complaint  Patient presents with  . Fall   - postop day 2 after left intertrochanteric fracture.  -Couldn't tolerate physical therapy due to significant hip pain yesterday. Hemoglobin dropped to 7.6 today. -Also complains of cough  REVIEW OF SYSTEMS:  Review of Systems  Constitutional: Negative for chills, fever and malaise/fatigue.  HENT: Negative for congestion, ear discharge, hearing loss and nosebleeds.   Eyes: Negative for blurred vision and double vision.  Respiratory: Positive for cough. Negative for shortness of breath and wheezing.   Cardiovascular: Negative for chest pain and palpitations.  Gastrointestinal: Negative for abdominal pain, constipation, diarrhea, nausea and vomiting.  Genitourinary: Negative for dysuria.  Musculoskeletal: Positive for joint pain and myalgias.  Neurological: Negative for dizziness, speech change, focal weakness, seizures and headaches.  Psychiatric/Behavioral: Negative for depression.    DRUG ALLERGIES:   Allergies  Allergen Reactions  . Penicillins Rash    Has patient had a PCN reaction causing immediate rash, facial/tongue/throat swelling, SOB or lightheadedness with hypotension: Unknown Has patient had a PCN reaction causing severe rash involving mucus membranes or skin necrosis: Unknown Has patient had a PCN reaction that required hospitalization: Unknown Has patient had a PCN reaction occurring within the last 10 years: Unknown If all of the above answers are "NO", then may proceed with Cephalosporin use.     VITALS:  Blood pressure 112/79, pulse 76, temperature 98 F (36.7 C), temperature source Oral, resp. rate 12, height 5\' 6"  (1.676 m), weight 68.4 kg (150 lb 12.8 oz), SpO2 94 %.  PHYSICAL EXAMINATION:  Physical Exam  GENERAL:  83 y.o.-year-old  elderly patient lying in the bed with no acute distress.  EYES: Pupils equal, round, reactive to light and accommodation. No scleral icterus. Extraocular muscles intact.  HEENT: Head atraumatic, normocephalic. Oropharynx and nasopharynx clear.  NECK:  Supple, no jugular venous distention. No thyroid enlargement, no tenderness.  LUNGS: Normal breath sounds bilaterally, no wheezing, rales,rhonchi or crepitation. No use of accessory muscles of respiration. Decreased bibasilar breath sounds  CARDIOVASCULAR: S1, S2 normal. No rubs, or gallops. 3/6 systolic murmur present ABDOMEN: Soft, nontender, nondistended. Bowel sounds present. No organomegaly or mass.  EXTREMITIES: No pedal edema, cyanosis, or clubbing. Left hip lateral thigh dressing noted. NEUROLOGIC: Cranial nerves II through XII are intact. Muscle strength 5/5 in all extremities. Sensation intact. Gait not checked. Global weakness present PSYCHIATRIC: The patient is alert and oriented x 3.  SKIN: No obvious rash, lesion, or ulcer.    LABORATORY PANEL:   CBC  Recent Labs Lab 06/03/17 0523  WBC 9.8  HGB 7.6*  HCT 22.8*  PLT 130*   ------------------------------------------------------------------------------------------------------------------  Chemistries   Recent Labs Lab 06/02/17 0432 06/03/17 0523  NA 140 136  K 4.6 4.5  CL 112* 110  CO2 22 24  GLUCOSE 153* 103*  BUN 23* 17  CREATININE 1.52* 1.05*  CALCIUM 8.0* 8.6*  AST 35  --   ALT 16  --   ALKPHOS 42  --   BILITOT 0.6  --    ------------------------------------------------------------------------------------------------------------------  Cardiac Enzymes No results for input(s): TROPONINI in the last 168 hours. ------------------------------------------------------------------------------------------------------------------  RADIOLOGY:  Dg Chest 1 View  Result Date: 06/01/2017 CLINICAL DATA:  Fall onto left hip. History of COPD, coronary artery disease,  GERD, hypertension, MI, smoker. EXAM: CHEST  1 VIEW COMPARISON:  Chest x-ray dated 08/09/2015. FINDINGS: New irregular nodular density within the left upper lung, suprahilar. Lungs appear otherwise clear. No pleural effusion or pneumothorax seen, although characterization is limited by over penetration. Heart size and mediastinal contours are stable. Atherosclerosis noted at the aortic arch. Degenerative spurring noted at the left shoulder. No osseous fracture or dislocation seen. IMPRESSION: 1. Irregular nodular density within the left upper lung, suprahilar. This is a suspicious finding for which chest CT is recommended. 2. Aortic atherosclerosis. Electronically Signed   By: Franki Cabot M.D.   On: 06/01/2017 10:39   Dg Chest 2 View  Result Date: 06/03/2017 CLINICAL DATA:  Shortness of Breath EXAM: CHEST  2 VIEW COMPARISON:  06/01/2017 FINDINGS: Cardiac shadow is stable. Aortic calcifications are again seen. The lungs are well aerated bilaterally. The somewhat nodular density in the left suprahilar region is again seen. CT of the chest is again recommended for further evaluation. No bony abnormality is seen. Minimal blunting of the costophrenic angles is noted which may represent small effusions. IMPRESSION: Stable appearing nodular density in the left suprahilar region. CT of the chest is again recommended for further evaluation. Electronically Signed   By: Inez Catalina M.D.   On: 06/03/2017 07:38   Dg Hip Operative Unilat W Or W/o Pelvis Left  Result Date: 06/01/2017 CLINICAL DATA:  Intertrochanteric fracture LEFT femur post surgery EXAM: OPERATIVE LEFT HIP (WITH PELVIS IF PERFORMED) 4 VIEWS TECHNIQUE: Fluoroscopic spot image(s) were submitted for interpretation post-operatively. FLUOROSCOPY TIME:  1 minutes 24 seconds Images obtained: 4 COMPARISON:  Preoperative images 8 04/24/2017 FINDINGS: Diffuse osseous demineralization. Placement of an IM nail with proximal compression screw across an  intertrochanteric fracture of the LEFT femur. Improved alignment of fracture fragments since previous exam. No dislocation seen. Distal locking screw noted at the distal portion of the IM nail. IMPRESSION: Post ORIF of previously identified intertrochanteric LEFT femoral fracture. Electronically Signed   By: Lavonia Dana M.D.   On: 06/01/2017 20:29   Dg Hip Unilat W Or Wo Pelvis 2-3 Views Left  Result Date: 06/01/2017 CLINICAL DATA:  Fall. EXAM: DG HIP (WITH OR WITHOUT PELVIS) 2-3V LEFT COMPARISON:  11/17/2016 . FINDINGS: Degenerative changes lumbar spine and both hips. Comminuted angulated left hip intertrochanteric fracture noted. Peripheral vascular calcification IMPRESSION: 1.  Comminuted, angulated left hip intertrochanteric fracture. 2. Peripheral vascular disease. Electronically Signed   By: Marcello Moores  Register   On: 06/01/2017 10:38    EKG:   Orders placed or performed during the hospital encounter of 06/01/17  . ED EKG  . ED EKG    ASSESSMENT AND PLAN:   83 year old female with past medical history significant for COPD, hypertension, depression and anxiety came in after mechanical fall and noted to have left intertrochanteric hip fracture.  #1 left intertrochanteric hip fracture-appreciate orthopedics input. S/p ORIF -Postop day 2  today. Continue working with pain mgmt, ice pack needed. -Physical therapy consult and will need placement - added muscle relaxants  #2 hypertension-hold off enalapril  and monitor as blood pressure has been low  #3 COPD-not on home oxygen, currently requiring 1-2 L. -Encouraged her incentive spirometer -chest x-ray with left upper lobe nodule, follow-up CT when stable or as outpatient. -No indication for systemic steroids. Continue duo nebs and inhalers - mucinex added  #4 Anemia- acute post op anemia, no active bleeding, but she dilutional due to IV fluids. -Recheck hemoglobin again. Transfuse if hemoglobin less than 7.5  #5 GERD- pepcid  #6 DVT  prophylaxis- on Lovenox  Physical therapy consulted and likely discharge to rehabilitation    All the records are reviewed and case discussed with Care Management/Social Workerr. Management plans discussed with the patient, family and they are in agreement.  CODE STATUS: DNR  TOTAL TIME TAKING CARE OF THIS PATIENT: 36 minutes.   POSSIBLE D/C  TOMORROW, DEPENDING ON CLINICAL CONDITION.   Issiac Jamar M.D on 06/03/2017 at 8:56 AM  Between 7am to 6pm - Pager - [phone]  After 6pm go to [email or web address] - password [email or web address] [facility]  Office  [phone]  CC: Primary care physician; Sofie Hartigan, MD

## 2017-06-04 LAB — ABO/RH: ABO/RH(D): B NEG

## 2017-06-04 LAB — PREPARE RBC (CROSSMATCH)

## 2017-06-04 LAB — HEMOGLOBIN AND HEMATOCRIT, BLOOD
HCT: 22.3 % — ABNORMAL LOW (ref 35.0–47.0)
HEMOGLOBIN: 7.6 g/dL — AB (ref 12.0–16.0)

## 2017-06-04 LAB — CBC
HCT: 21.6 % — ABNORMAL LOW (ref 35.0–47.0)
Hemoglobin: 7.3 g/dL — ABNORMAL LOW (ref 12.0–16.0)
MCH: 29.1 pg (ref 26.0–34.0)
MCHC: 33.7 g/dL (ref 32.0–36.0)
MCV: 86.5 fL (ref 80.0–100.0)
PLATELETS: 126 10*3/uL — AB (ref 150–440)
RBC: 2.5 MIL/uL — ABNORMAL LOW (ref 3.80–5.20)
RDW: 14.8 % — AB (ref 11.5–14.5)
WBC: 6.3 10*3/uL (ref 3.6–11.0)

## 2017-06-04 MED ORDER — GUAIFENESIN ER 600 MG PO TB12: 600.0000 mg | ORAL_TABLET | Freq: Two times a day (BID) | ORAL | 0 refills | Status: DC

## 2017-06-04 MED ORDER — METHOCARBAMOL 500 MG PO TABS: 500.0000 mg | ORAL_TABLET | Freq: Three times a day (TID) | ORAL | 0 refills | Status: DC

## 2017-06-04 MED ORDER — ENALAPRIL MALEATE 5 MG PO TABS: 5.0000 mg | ORAL_TABLET | Freq: Every day | ORAL | 0 refills | Status: DC

## 2017-06-04 MED ORDER — SODIUM CHLORIDE 0.9 % IV SOLN
Freq: Once | INTRAVENOUS | Status: AC
  Administered 2017-06-04: 08:00:00 via INTRAVENOUS

## 2017-06-04 MED ORDER — DOCUSATE SODIUM 100 MG PO CAPS: 100.0000 mg | ORAL_CAPSULE | Freq: Two times a day (BID) | ORAL | 0 refills | Status: DC

## 2017-06-04 MED ORDER — POLYETHYLENE GLYCOL 3350 17 G PO PACK: 17.0000 g | PACK | Freq: Every day | ORAL | 0 refills | Status: DC

## 2017-06-04 MED ORDER — FE FUMARATE-B12-VIT C-FA-IFC PO CAPS: 1.0000 | ORAL_CAPSULE | Freq: Two times a day (BID) | ORAL | 0 refills | Status: DC

## 2017-06-04 MED ORDER — OXYCODONE HCL 5 MG PO TABS: 5.0000 mg | ORAL_TABLET | ORAL | 0 refills | Status: DC | PRN

## 2017-06-04 MED ORDER — ENOXAPARIN SODIUM 40 MG/0.4ML ~~LOC~~ SOLN: 40.0000 mg | SUBCUTANEOUS | 0 refills | Status: DC

## 2017-06-04 NOTE — Progress Notes (Signed)
OT Cancellation Note  Patient Details Name: Kari Mcdonald MRN: [id number] DOB: 01/10/1932   Cancelled Treatment:    Reason Eval/Treat Not Completed: Medical issues which prohibited therapy. Pt with hemoglobin of 7.3. Receiving blood transfusion this morning. Will hold OT treatment at this time and re-attempt at later date/time as pt is medically appropriate.  Jeni Salles, MPH, MS, OTR/L ascom [phone] 06/04/17, 8:41 AM

## 2017-06-04 NOTE — Discharge Summary (Addendum)
Kari Mcdonald at [place] NAME: Kari Mcdonald    MR#:  [id number]  DATE OF BIRTH:  Oct 02, 1932  DATE OF ADMISSION:  06/01/2017   ADMITTING PHYSICIAN: Kari Knows, MD  DATE OF DISCHARGE: 06/04/2017  PRIMARY CARE PHYSICIAN: Kari Hartigan, MD   ADMISSION DIAGNOSIS:   Closed fracture of left hip, initial encounter ([place]) [S72.002A]  DISCHARGE DIAGNOSIS:   Active Problems:   Closed left hip fracture ([place])   SECONDARY DIAGNOSIS:   Past Medical History:  Diagnosis Date  . Anemia   . Arthritis    left knee  . Cancer ([place])    Skin Cancer  . COPD (chronic obstructive pulmonary disease) ([place])   . Coronary artery disease   . Depression   . Diverticulosis   . Dysphagia   . Dyspnea   . GERD (gastroesophageal reflux disease)   . GI bleed   . Headache    from eye strain  . History of hiatal hernia   . HOH (hard of hearing)   . Hyperlipidemia   . Hypertension   . Myocardial infarction ([place])    2006  . Osteoporosis   . Wears dentures    full upper and lower    HOSPITAL COURSE:   83 year old female with past medical history significant for COPD, hypertension, depression and anxiety came in after mechanical fall and noted to have left intertrochanteric hip fracture.  #1 left intertrochanteric hip fracture-appreciate orthopedics input. S/p ORIF -Postop day 3  today. Continue working with pain mgmt, ice pack as needed. -Physical therapy consult and will need rehab- going to [facility] today - added muscle relaxants -her staples will be removed in 10 days and Steri-Strips will be applied. Follow up with Orthopedics as recommended  #2 hypertension- enalapril has been started at a lower dose  #3 COPD-not on home oxygen, currently requiring 1-2 L. -Encouraged her to do  incentive spirometer -chest x-ray with left upper lobe nodule, follow-up CT when stable or as outpatient. -No indication for systemic steroids. Continue nebs  and inhalers - mucinex added  #4 Anemia- acute post op anemia, no active bleeding, also dilutional due to IV fluids. -since hb at 7.3 and patient feels weak to participate in therapy- transfuse 1 unit today -monitor hemoglobin again within 2 days  #5 GERD- on PPI  #6 DVT prophylaxis- on Lovenox for 2 weeks after surgery.   Likely discharge to rehabilitation today.   DISCHARGE CONDITIONS:   Guarded  CONSULTS OBTAINED:   Treatment Team:  Kari Knows, MD  DRUG ALLERGIES:   Allergies  Allergen Reactions  . Penicillins Rash    Has patient had a PCN reaction causing immediate rash, facial/tongue/throat swelling, SOB or lightheadedness with hypotension: Unknown Has patient had a PCN reaction causing severe rash involving mucus membranes or skin necrosis: Unknown Has patient had a PCN reaction that required hospitalization: Unknown Has patient had a PCN reaction occurring within the last 10 years: Unknown If all of the above answers are "NO", then may proceed with Cephalosporin use.    DISCHARGE MEDICATIONS:   Allergies as of 06/04/2017      Reactions   Penicillins Rash   Has patient had a PCN reaction causing immediate rash, facial/tongue/throat swelling, SOB or lightheadedness with hypotension: Unknown Has patient had a PCN reaction causing severe rash involving mucus membranes or skin necrosis: Unknown Has patient had a PCN reaction that required hospitalization: Unknown Has patient had a PCN reaction occurring within the last  10 years: Unknown If all of the above answers are "NO", then may proceed with Cephalosporin use.      Medication List    STOP taking these medications   aspirin EC 81 MG tablet   budesonide 3 MG 24 hr capsule Commonly known as:  ENTOCORT EC   meloxicam 7.5 MG tablet Commonly known as:  MOBIC   polyethylene glycol 236 g solution Commonly known as:  GOLYTELY     TAKE these medications   albuterol 108 (90 Base) MCG/ACT  inhaler Commonly known as:  PROVENTIL HFA;VENTOLIN HFA Inhale 2 puffs into the lungs every 6 (six) hours as needed for wheezing or shortness of breath.   atorvastatin 20 MG tablet Commonly known as:  LIPITOR Take 20 mg by mouth daily.   citalopram 40 MG tablet Commonly known as:  CELEXA Take 40 mg by mouth daily.   dexlansoprazole 60 MG capsule Commonly known as:  DEXILANT Take 60 mg by mouth daily.   docusate sodium 100 MG capsule Commonly known as:  COLACE Take 1 capsule (100 mg total) by mouth 2 (two) times daily.   enalapril 5 MG tablet Commonly known as:  VASOTEC Take 1 tablet (5 mg total) by mouth daily. What changed:  medication strength  how much to take   enoxaparin 40 MG/0.4ML injection Commonly known as:  LOVENOX Inject 0.4 mLs (40 mg total) into the skin daily. X 12 days and STOP   feeding supplement (ENSURE ENLIVE) Liqd Take 237 mLs by mouth 3 (three) times daily with meals.   ferrous [id number] C-folic acid capsule Commonly known as:  TRINSICON / FOLTRIN Take 1 capsule by mouth 2 (two) times daily.   guaiFENesin 600 MG 12 hr tablet Commonly known as:  MUCINEX Take 1 tablet (600 mg total) by mouth 2 (two) times daily.   methocarbamol 500 MG tablet Commonly known as:  ROBAXIN Take 1 tablet (500 mg total) by mouth 3 (three) times daily. X 3 days   oxyCODONE 5 MG immediate release tablet Commonly known as:  Oxy IR/ROXICODONE Take 1 tablet (5 mg total) by mouth every 4 (four) hours as needed for moderate pain or severe pain.   polyethylene glycol packet Commonly known as:  MIRALAX Take 17 g by mouth daily.   PROBIOTIC ACIDOPHILUS PO Take 1 tablet by mouth daily.   tiotropium 18 MCG inhalation capsule Commonly known as:  SPIRIVA Place 18 mcg into inhaler and inhale daily.   traZODone 100 MG tablet Commonly known as:  DESYREL Take 200 mg by mouth at bedtime.            Discharge Care Instructions        Start     Ordered    06/05/17 0000  enoxaparin (LOVENOX) 40 MG/0.4ML injection  Every 24 hours     06/04/17 0909   06/04/17 0000  docusate sodium (COLACE) 100 MG capsule  2 times daily     06/04/17 0909   06/04/17 0000  methocarbamol (ROBAXIN) 500 MG tablet  3 times daily     06/04/17 0909   06/04/17 0000  guaiFENesin (MUCINEX) 600 MG 12 hr tablet  2 times daily     06/04/17 0909   06/04/17 0000  ferrous [id number] C-folic acid (TRINSICON / FOLTRIN) capsule  2 times daily     06/04/17 0909   06/04/17 0000  enalapril (VASOTEC) 5 MG tablet  Daily     06/04/17 0909   06/04/17 0000  oxyCODONE (OXY IR/ROXICODONE) 5 MG  immediate release tablet  Every 4 hours PRN     06/04/17 0909   06/04/17 0000  Diet - low sodium heart healthy     06/04/17 0909   06/04/17 0000  Activity as tolerated - No restrictions     06/04/17 0909   06/04/17 0000  polyethylene glycol (MIRALAX) packet  Daily     06/04/17 0909       DISCHARGE INSTRUCTIONS:   1. PCP follow-up in 1-2 weeks 2. Orthopedics follow-up in 10 days 3. Will need CT chest to look into the right upper lobe nodule as outpatient within 2 weeks 4. Hemoglobin check in 2 days  DIET:   Cardiac diet  ACTIVITY:   Activity as tolerated  OXYGEN:   Home Oxygen: Yes.    Oxygen Delivery: 2 liters/min via Patient connected to nasal cannula oxygen  DISCHARGE LOCATION:   nursing home   If you experience worsening of your admission symptoms, develop shortness of breath, life threatening emergency, suicidal or homicidal thoughts you must seek medical attention immediately by calling 911 or calling your MD immediately  if symptoms less severe.  You Must read complete instructions/literature along with all the possible adverse reactions/side effects for all the Medicines you take and that have been prescribed to you. Take any new Medicines after you have completely understood and accpet all the possible adverse reactions/side effects.   Please note  You were  cared for by a hospitalist during your hospital stay. If you have any questions about your discharge medications or the care you received while you were in the hospital after you are discharged, you can call the unit and asked to speak with the hospitalist on call if the hospitalist that took care of you is not available. Once you are discharged, your primary care physician will handle any further medical issues. Please note that NO REFILLS for any discharge medications will be authorized once you are discharged, as it is imperative that you return to your primary care physician (or establish a relationship with a primary care physician if you do not have one) for your aftercare needs so that they can reassess your need for medications and monitor your lab values.    On the day of Discharge:  VITAL SIGNS:   Blood pressure (!) 146/54, pulse 78, temperature 98.9 F (37.2 C), temperature source Oral, resp. rate 12, height 5\' 6"  (1.676 m), weight 68.4 kg (150 lb 12.8 oz), SpO2 99 %.  PHYSICAL EXAMINATION:    GENERAL:  83 y.o.-year-old elderly patient lying in the bed with no acute distress.  EYES: Pupils equal, round, reactive to light and accommodation. No scleral icterus. Extraocular muscles intact.  HEENT: Head atraumatic, normocephalic. Oropharynx and nasopharynx clear.  NECK:  Supple, no jugular venous distention. No thyroid enlargement, no tenderness.  LUNGS: Normal breath sounds bilaterally, no wheezing, rales,rhonchi or crepitation. No use of accessory muscles of respiration. Decreased bibasilar breath sounds  CARDIOVASCULAR: S1, S2 normal. No rubs, or gallops. 3/6 systolic murmur present ABDOMEN: Soft, nontender, nondistended. Bowel sounds present. No organomegaly or mass.  EXTREMITIES: No pedal edema, cyanosis, or clubbing. Left hip lateral thigh dressing noted with some swelling and tenderness NEUROLOGIC: Cranial nerves II through XII are intact. Muscle strength 5/5 in all extremities.  Sensation intact. Gait not checked. Global weakness present PSYCHIATRIC: The patient is alert and oriented x 3.  SKIN: No obvious rash, lesion, or ulcer.    DATA REVIEW:   CBC  Recent Labs Lab 06/04/17 [phone]  WBC 6.3  HGB 7.3*  HCT 21.6*  PLT 126*    Chemistries   Recent Labs Lab 06/02/17 0432 06/03/17 0523  NA 140 136  K 4.6 4.5  CL 112* 110  CO2 22 24  GLUCOSE 153* 103*  BUN 23* 17  CREATININE 1.52* 1.05*  CALCIUM 8.0* 8.6*  AST 35  --   ALT 16  --   ALKPHOS 42  --   BILITOT 0.6  --      Microbiology Results  Results for orders placed or performed during the hospital encounter of 06/01/17  Surgical PCR screen     Status: None   Collection Time: 06/01/17  2:30 PM  Result Value Ref Range Status   MRSA, PCR NEGATIVE NEGATIVE Final   Staphylococcus aureus NEGATIVE NEGATIVE Final    Comment:        The Xpert SA Assay (FDA approved for NASAL specimens in patients over 76 years of age), is one component of a comprehensive surveillance program.  Test performance has been validated by [facility] for patients greater than or equal to 33 year old. It is not intended to diagnose infection nor to guide or monitor treatment.     RADIOLOGY:  No results found.   Management plans discussed with the patient, family and they are in agreement.  CODE STATUS:     Code Status Orders        Start     Ordered   06/01/17 1248  Do not attempt resuscitation (DNR)  Continuous    Question Answer Comment  In the event of cardiac or respiratory ARREST Do not call a "code blue"   In the event of cardiac or respiratory ARREST Do not perform Intubation, CPR, defibrillation or ACLS   In the event of cardiac or respiratory ARREST Use medication by any route, position, wound care, and other measures to relive pain and suffering. May use oxygen, suction and manual treatment of airway obstruction as needed for comfort.   Comments RN  MAY PRONOUNCE      06/01/17 1247      Code Status History    Date Active Date Inactive Code Status Order ID Comments User Context   11/17/2016  2:33 PM 11/19/2016  5:46 PM DNR [id number]  Nicholes Mango, MD Inpatient    Advance Directive Documentation     Most Recent Value  Type of Advance Directive  Healthcare Power of [place], Living will  Pre-existing out of facility DNR order (yellow form or pink MOST form)  -  "MOST" Form in Place?  -      TOTAL TIME TAKING CARE OF THIS PATIENT: 37 minutes.    Haris Baack M.D on 06/04/2017 at 9:10 AM  Between 7am to 6pm - Pager - [phone]  After 6pm go to [email or web address] - Proofreader  [facility]  Office  [phone]  CC: Primary care physician; Kari Hartigan, MD   Note: This dictation was prepared with Dragon dictation along with smaller phrase technology. Any transcriptional errors that result from this process are unintentional.

## 2017-06-04 NOTE — Progress Notes (Signed)
PIV removed. VSS. EMS arrived to transport patient to [facility].

## 2017-06-04 NOTE — Progress Notes (Signed)
Physical Therapy Treatment Patient Details Name: Kari Mcdonald MRN: [id number] DOB: 1932-06-22 Today's Date: 06/04/2017    History of Present Illness Pt is a 83 y/o F who presented s/p fall with imaging revealing a closed L intertrochanteric fx.  Pt is now s/p L intertrochanteric IM nail.  Pt's PMH includes skin cancer, COPD, osteoporosis, MI.    PT Comments    Participated in exercises as described below.  Pt had just finished blood transfusion before exercises.  Pt with pain limiting mobility skills.  Poor tolerance of exercises due to pain.  Pt awaiting transfer to SNF.  Follow Up Recommendations  SNF     Equipment Recommendations  Rolling walker with 5" wheels    Recommendations for Other Services       Precautions / Restrictions Precautions Precautions: Fall Precaution Comments: Monitor O2, pt not on O2 at baseline Restrictions Weight Bearing Restrictions: Yes RLE Weight Bearing: Weight bearing as tolerated LLE Weight Bearing: Weight bearing as tolerated    Mobility  Bed Mobility               General bed mobility comments: deferred  Transfers                    Ambulation/Gait                 Stairs            Wheelchair Mobility    Modified Rankin (Stroke Patients Only)       Balance                                            Cognition Arousal/Alertness: Awake/alert Behavior During Therapy: WFL for tasks assessed/performed Overall Cognitive Status: History of cognitive impairments - at baseline                                        Exercises Other Exercises Other Exercises: BLE AAROM for ankle pumps, heel slides, Ab/add, SLR and SAQ    General Comments        Pertinent Vitals/Pain Pain Assessment: Faces Faces Pain Scale: Hurts whole lot Pain Location: L hip Pain Descriptors / Indicators: Aching;Grimacing;Guarding Pain Intervention(s): Limited activity within patient's  tolerance    Home Living                      Prior Function            PT Goals (current goals can now be found in the care plan section)      Frequency    BID      PT Plan      Co-evaluation              AM-PAC PT "6 Clicks" Daily Activity  Outcome Measure  Difficulty turning over in bed (including adjusting bedclothes, sheets and blankets)?: Unable Difficulty moving from lying on back to sitting on the side of the bed? : Unable Difficulty sitting down on and standing up from a chair with arms (e.g., wheelchair, bedside commode, etc,.)?: Unable Help needed moving to and from a bed to chair (including a wheelchair)?: Total Help needed walking in hospital room?: Total Help needed climbing 3-5 steps with a railing? : Total 6 Click Score: 6  End of Session   Activity Tolerance: Patient limited by fatigue;Patient limited by pain Patient left: in bed;with call bell/phone within reach;with bed alarm set;with family/visitor present   Pain - Right/Left: Left Pain - part of body: Hip     Time: 1030-1043 PT Time Calculation (min) (ACUTE ONLY): 13 min  Charges:  $Therapeutic Exercise: 8-22 mins                    G Codes:       Chesley Noon, PTA 06/04/17, 1:13 PM

## 2017-06-04 NOTE — Progress Notes (Signed)
   Subjective: 3 Days Post-Op Procedure(s) (LRB): INTRAMEDULLARY (IM) NAIL INTERTROCHANTRIC (Left) Patient reports pain as mild,  Patient is well, and has had no acute complaints or problems Denies any CP, SOB, ABD pain. We will continue therapy today.  Plan is to go Skilled nursing facility after hospital stay.  Objective: Vital signs in last 24 hours: Temp:  [98 F (36.7 C)-98.6 F (37 C)] 98.6 F (37 C) (08/29 1921) Pulse Rate:  [75-76] 75 (08/29 1921) Resp:  [12-18] 18 (08/29 1921) BP: (112-145)/(55-79) 145/55 (08/29 1921) SpO2:  [94 %] 94 % (08/29 1921)  Intake/Output from previous day: 08/29 0701 - 08/30 0700 In: 240 [P.O.:240] Out: 425 [Urine:425] Intake/Output this shift: No intake/output data recorded.   Recent Labs  06/02/17 0432 06/03/17 0523 06/03/17 1054 06/03/17 1523 06/04/17 0438  HGB 9.2* 7.6* 7.8* 7.7* 7.3*    Recent Labs  06/03/17 0523 06/04/17 0438  WBC 9.8 6.3  RBC 2.63* 2.50*  HCT 22.8* 21.6*  PLT 130* 126*    Recent Labs  06/02/17 0432 06/03/17 0523  NA 140 136  K 4.6 4.5  CL 112* 110  CO2 22 24  BUN 23* 17  CREATININE 1.52* 1.05*  GLUCOSE 153* 103*  CALCIUM 8.0* 8.6*   No results for input(s): LABPT, INR in the last 72 hours.  EXAM General - Patient is Alert, Appropriate and Oriented Extremity - Neurovascular intact Sensation intact distally Intact pulses distally Dorsiflexion/Plantar flexion intact No cellulitis present Compartment soft Dressing - dressing C/D/I and scant drainage.  Motor Function - intact, moving foot and toes well on exam.   Past Medical History:  Diagnosis Date  . Anemia   . Arthritis    left knee  . Cancer ([place])    Skin Cancer  . COPD (chronic obstructive pulmonary disease) ([place])   . Coronary artery disease   . Depression   . Diverticulosis   . Dysphagia   . Dyspnea   . GERD (gastroesophageal reflux disease)   . GI bleed   . Headache    from eye strain  . History of hiatal hernia    . HOH (hard of hearing)   . Hyperlipidemia   . Hypertension   . Myocardial infarction ([place])    2006  . Osteoporosis   . Wears dentures    full upper and lower    Assessment/Plan:   3 Days Post-Op Procedure(s) (LRB): INTRAMEDULLARY (IM) NAIL INTERTROCHANTRIC (Left) Active Problems:   Closed left hip fracture (HCC)   Acute post op blood loss anemia   Estimated body mass index is 24.34 kg/m as calculated from the following:   Height as of this encounter: 5\' 6"  (1.676 m).   Weight as of this encounter: 68.4 kg (150 lb 12.8 oz). Advance diet Up with therapy  Acute post op blood loss anemia - Hgb 7.3. Continue with Iron. Agree with medicine to transfuse today CM to assist with discharge to SNF  Will need follow up with [facility] in 6 weeks Staples to be removed and steri strips applied on 06/15/17 Continue with Lovenox 40 mg daily x 14 days   DVT Prophylaxis - Lovenox, Foot Pumps and TED hose Weight-Bearing as tolerated to left leg   T. Rachelle Hora, PA-C [place] 06/04/2017, 7:14 AM

## 2017-06-04 NOTE — Care Management Important Message (Signed)
Important Message  Patient Details  Name: Kari Mcdonald MRN: [id number] Date of Birth: 1932/05/11   Medicare Important Message Given:  Yes    Jolly Mango, RN 06/04/2017, 9:36 AM

## 2017-06-04 NOTE — Clinical Social Work Placement (Signed)
   CLINICAL SOCIAL WORK PLACEMENT  NOTE  Date:  06/04/2017  Patient Details  Name: Kari Mcdonald MRN: [id number] Date of Birth: 1931/11/23  Clinical Social Work is seeking post-discharge placement for this patient at the [facility] level of care (*CSW will initial, date and re-position this form in  chart as items are completed):  Yes   Patient/family provided with [place] Work Department's list of facilities offering this level of care within the geographic area requested by the patient (or if unable, by the patient's family).  Yes   Patient/family informed of their freedom to choose among providers that offer the needed level of care, that participate in Medicare, Medicaid or managed care program needed by the patient, have an available bed and are willing to accept the patient.  Yes   Patient/family informed of [facility]'s ownership interest in [facility] and [facility], as well as of the fact that they are under no obligation to receive care at these facilities.  PASRR submitted to EDS on 06/01/17     PASRR number received on 06/01/17     Existing PASRR number confirmed on       FL2 transmitted to all facilities in geographic area requested by pt/family on 06/01/17     FL2 transmitted to all facilities within larger geographic area on       Patient informed that his/her managed care company has contracts with or will negotiate with certain facilities, including the following:        Yes   Patient/family informed of bed offers received.  Patient chooses bed at  [facility] )     Physician recommends and patient chooses bed at      Patient to be transferred to  [facility] ) on 06/04/17.  Patient to be transferred to facility by  [facility] )     Patient family notified on 06/04/17 of transfer.  Name of family member notified:   (Patient's son Chrissie Noa is at bedside and aware of D/C today. )     PHYSICIAN        Additional Comment:    _______________________________________________ Angelyn Osterberg, Veronia Beets, LCSW 06/04/2017, 9:58 AM

## 2017-06-04 NOTE — Progress Notes (Signed)
Report called in to [facility] at [facility]. EMS called for transport to SNF.  Bethann Punches, RN

## 2017-06-04 NOTE — Progress Notes (Signed)
Blood transfusion ended at this time. VSS. No reactions noted.

## 2017-06-04 NOTE — Progress Notes (Signed)
Patient is medically stable for D/C to [facility] today. Per [facility] admissions coordinator at [facility] patient can come today to room E-2. RN will call report and arrange EMS for transport. Clinical Education officer, museum (CSW) sent D/C orders to [facility] via [employer]. Patient is aware of above. Patient's son Chrissie Noa is at bedside and aware above. Please reconsult if future social work needs arise. CSW signing off.   [employer], LCSW [phone]

## 2017-06-04 NOTE — Progress Notes (Signed)
Remained in room with patient for first 15 mins. VSS. Will continue to monitor thrughout transfusion.

## 2017-06-04 NOTE — Progress Notes (Addendum)
VSS. Blood consent signed. Patient and son verbalize understanding of teaching. Transfusion started.

## 2017-06-05 LAB — TYPE AND SCREEN
ABO/RH(D): B NEG
ANTIBODY SCREEN: NEGATIVE
Unit division: 0
WEAK D: NEGATIVE

## 2017-06-05 LAB — BPAM RBC
Blood Product Expiration Date: 201809022359
ISSUE DATE / TIME: 201808300800
UNIT TYPE AND RH: 9500

## 2017-11-02 IMAGING — RF DG ESOPHAGUS
10 of 13 series · 15 of 23 positions shown · non-contrast
Comparison: None.

CLINICAL DATA: Dysphagia.

EXAM:
ESOPHOGRAM / BARIUM SWALLOW / BARIUM TABLET STUDY
TECHNIQUE: Combined double contrast and single contrast examination performed
using effervescent crystals, thick barium liquid, and thin barium
liquid. The patient was observed with fluoroscopy swallowing a 13 mm
barium sulphate tablet.
FLUOROSCOPY TIME:  Radiation Exposure Index (as provided by the
fluoroscopic device): 32.0 mGy

[Series 1: fluoro_barium 2fps_bw · 0.18mm/px · 3 of 5 frames shown (1 of 9)]
[frame 1/5]
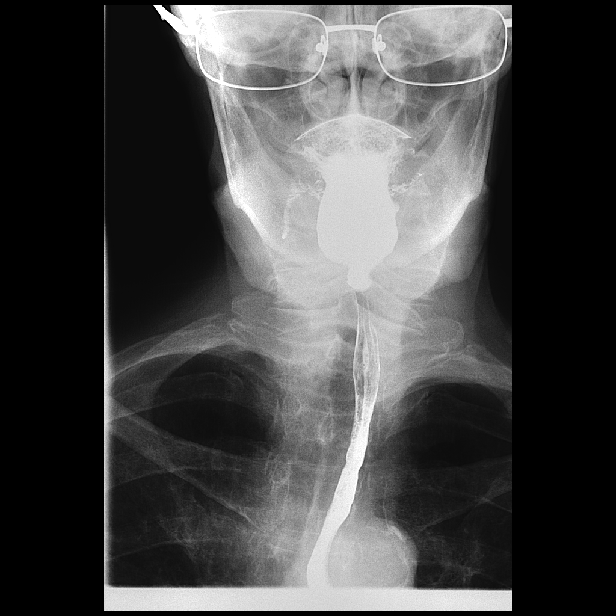
[frame 3/5]
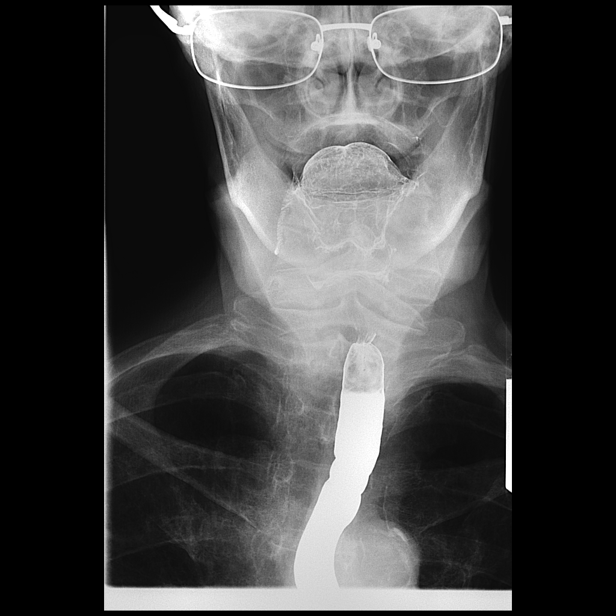
[frame 5/5]
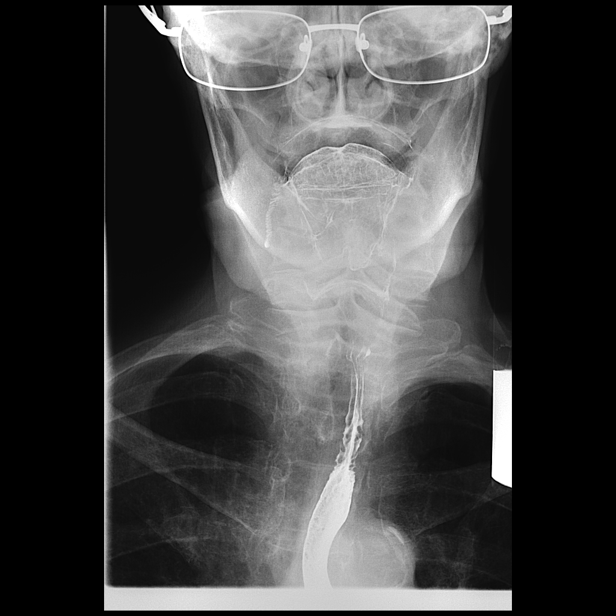

[Series 2: fluoro_barium 2fps_bw · 0.18mm/px · 2 of 5 frames shown (2 of 9)]
[frame 3/5]
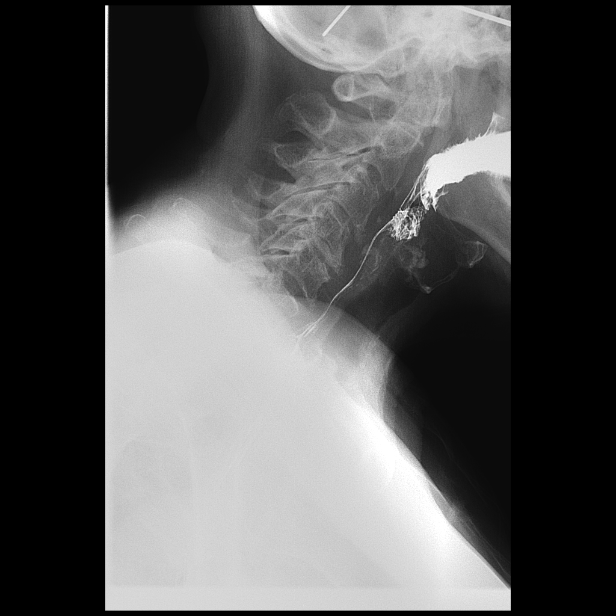
[frame 4/5]
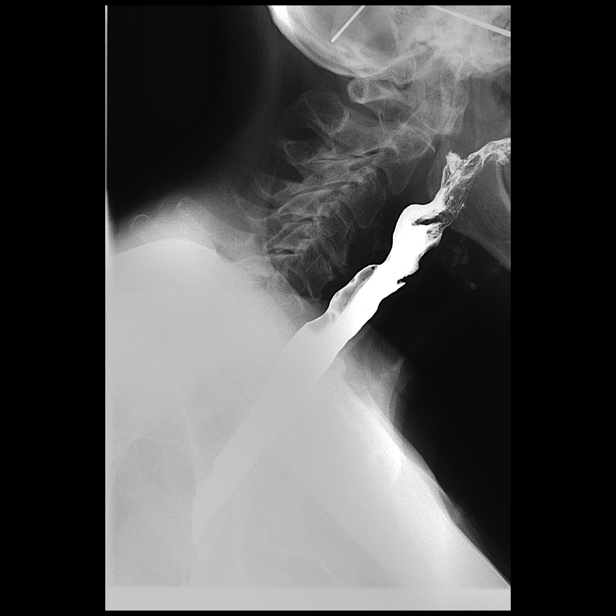

[Series 3: fluoro_barium 2fps_bw · 0.18mm/px · 1 of 1 slices shown (3 of 9)]
[im 1/1]
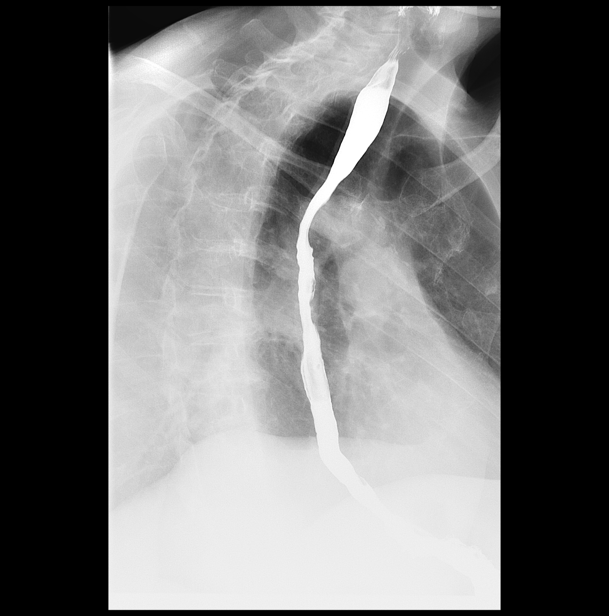

[Series 4: fluoro_barium 2fps_bw · 0.18mm/px · 1 of 1 slices shown (4 of 9)]
[im 1/1]
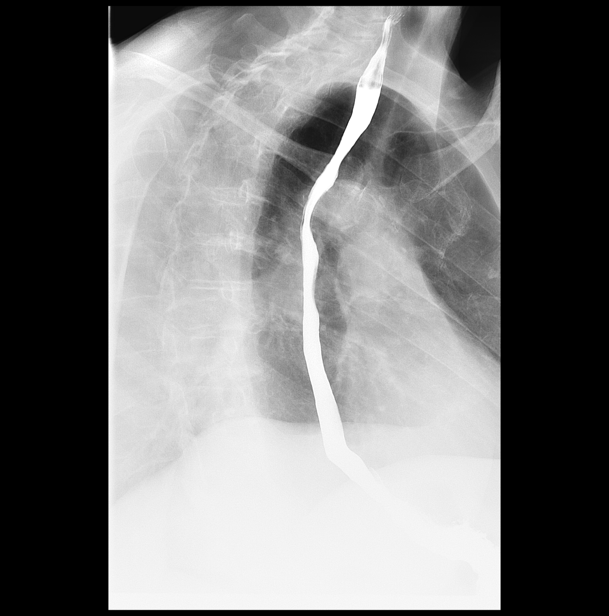

[Series 6: fluoro_barium 2fps_bw · 0.18mm/px · 1 of 1 slices shown (5 of 9)]
[im 1/1]
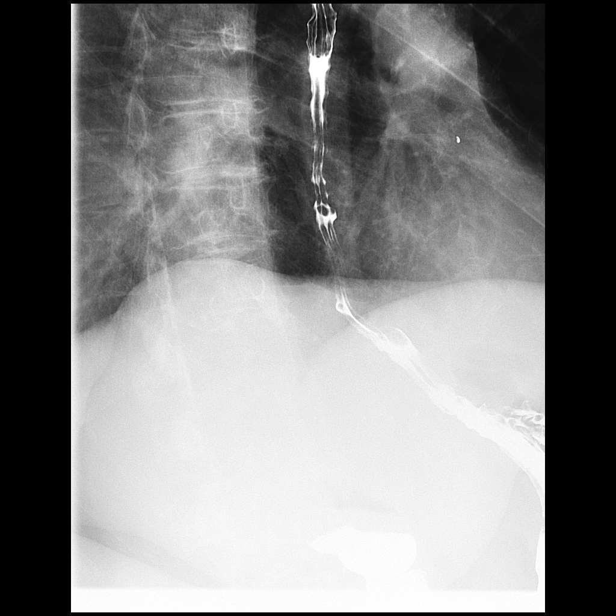

[Series 8: fluoro_barium 2fps_bw · 0.18mm/px · 1 of 1 slices shown (6 of 9)]
[im 1/1]
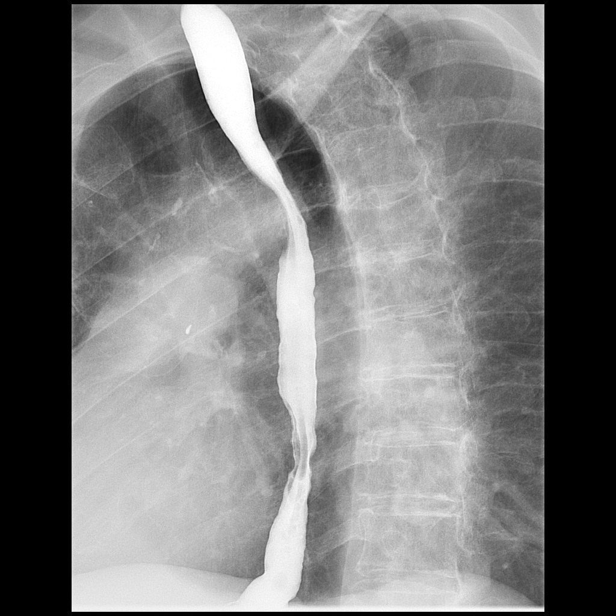

[Series 9: fluoro_barium 2fps_bw · 0.18mm/px · 1 of 1 slices shown (7 of 9)]
[im 1/1]
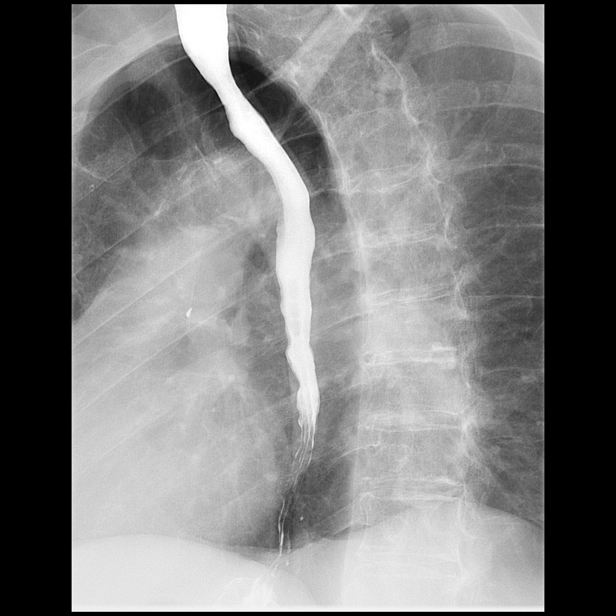

[Series 11: fluoro_barium 2fps_bw · 0.18mm/px · 1 of 1 slices shown (8 of 9)]
[im 1/1]
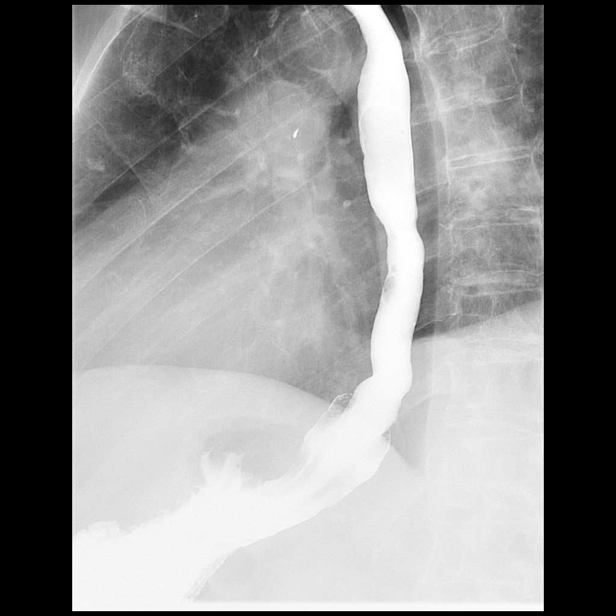

[Series 12: fluoro_barium 2fps_bw · 0.19mm/px · 1 of 2 frames shown (9 of 9)]
[frame 1/2]
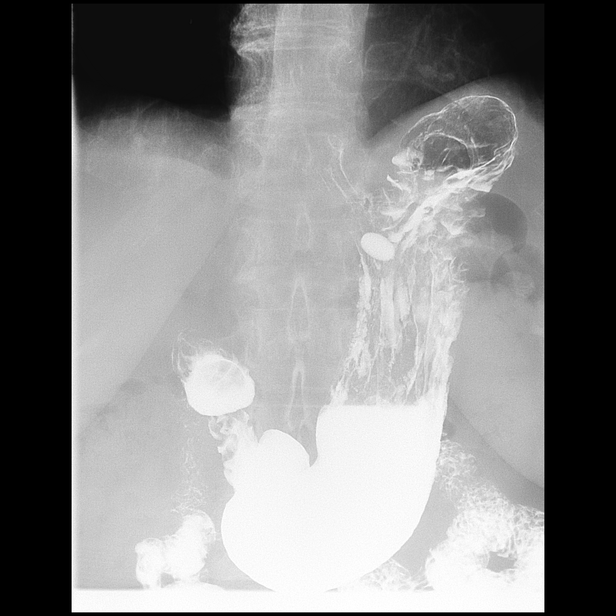

[Series 13: cp_standard · 0.37mm/px · 3 of 26 frames shown]
[frame 4/26]
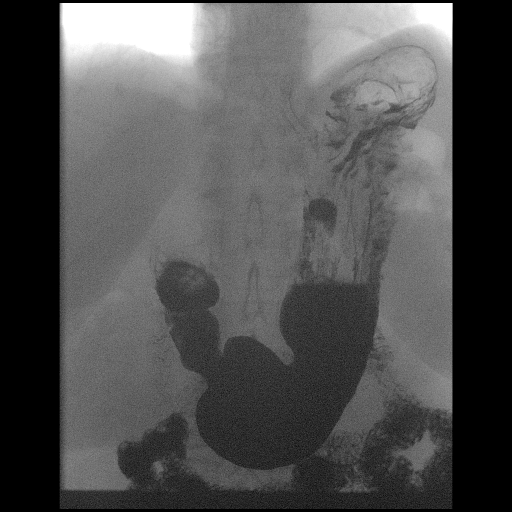
[frame 14/26]
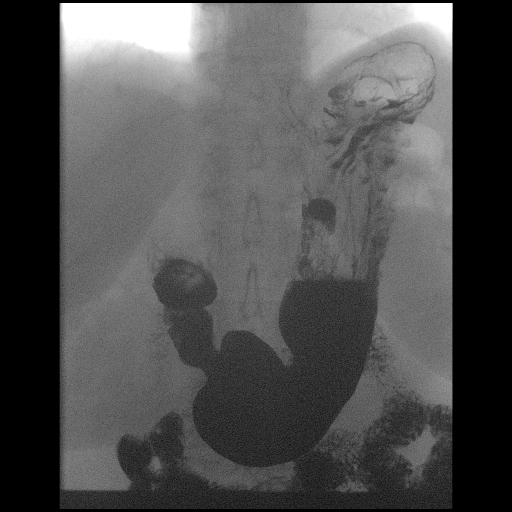
[frame 23/26]
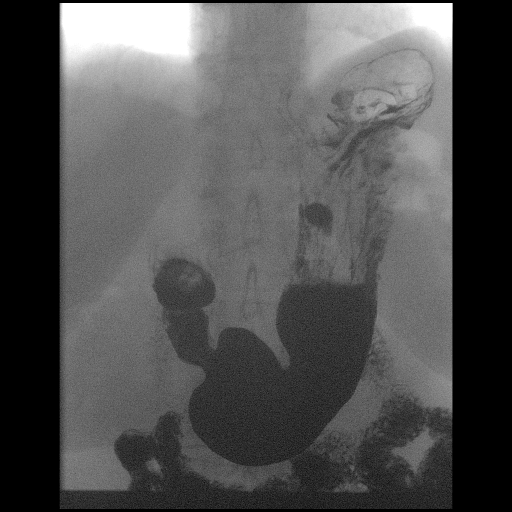

[15 of 23 positions shown; findings below may reference images not displayed]

FINDINGS: Cervical esophagus is widely patent. Thoracic esophagus is widely
patent. Tertiary esophageal contractions are noted consistent
presbyesophagus. Small sliding hiatal hernia is noted. No reflux.
Standardized barium tablet passed normally .
IMPRESSION: 1. Presbyesophagus.

2. Small sliding hiatal hernia. No reflux. Standardized barium
tablet passed normally. No obstructing abnormality.

## 2018-07-21 ENCOUNTER — Emergency Department: Payer: Medicare Other

## 2018-07-21 ENCOUNTER — Inpatient Hospital Stay
Admission: EM | Admit: 2018-07-21 | Discharge: 2018-07-23 | DRG: 643 | Disposition: A | Discharge status: Skilled Nursing Facility | Payer: Medicare Other | Attending: Internal Medicine | Admitting: Internal Medicine

## 2018-07-21 ENCOUNTER — Other Ambulatory Visit: Payer: Self-pay

## 2018-07-21 DIAGNOSIS — I252 Old myocardial infarction: Secondary | ICD-10-CM

## 2018-07-21 DIAGNOSIS — Z9842 Cataract extraction status, left eye: Secondary | ICD-10-CM | POA: Diagnosis not present

## 2018-07-21 DIAGNOSIS — M810 Age-related osteoporosis without current pathological fracture: Secondary | ICD-10-CM | POA: Diagnosis present

## 2018-07-21 DIAGNOSIS — E222 Syndrome of inappropriate secretion of antidiuretic hormone: Secondary | ICD-10-CM | POA: Diagnosis not present

## 2018-07-21 DIAGNOSIS — J9601 Acute respiratory failure with hypoxia: Secondary | ICD-10-CM | POA: Diagnosis present

## 2018-07-21 DIAGNOSIS — E876 Hypokalemia: Secondary | ICD-10-CM | POA: Diagnosis present

## 2018-07-21 DIAGNOSIS — E785 Hyperlipidemia, unspecified: Secondary | ICD-10-CM | POA: Diagnosis present

## 2018-07-21 DIAGNOSIS — I10 Essential (primary) hypertension: Secondary | ICD-10-CM | POA: Diagnosis present

## 2018-07-21 DIAGNOSIS — Z961 Presence of intraocular lens: Secondary | ICD-10-CM | POA: Diagnosis present

## 2018-07-21 DIAGNOSIS — W1830XA Fall on same level, unspecified, initial encounter: Secondary | ICD-10-CM | POA: Diagnosis present

## 2018-07-21 DIAGNOSIS — Z79899 Other long term (current) drug therapy: Secondary | ICD-10-CM

## 2018-07-21 DIAGNOSIS — K219 Gastro-esophageal reflux disease without esophagitis: Secondary | ICD-10-CM | POA: Diagnosis present

## 2018-07-21 DIAGNOSIS — J441 Chronic obstructive pulmonary disease with (acute) exacerbation: Secondary | ICD-10-CM | POA: Diagnosis present

## 2018-07-21 DIAGNOSIS — F17210 Nicotine dependence, cigarettes, uncomplicated: Secondary | ICD-10-CM | POA: Diagnosis present

## 2018-07-21 DIAGNOSIS — Z79891 Long term (current) use of opiate analgesic: Secondary | ICD-10-CM | POA: Diagnosis not present

## 2018-07-21 DIAGNOSIS — Z7951 Long term (current) use of inhaled steroids: Secondary | ICD-10-CM

## 2018-07-21 DIAGNOSIS — Z66 Do not resuscitate: Secondary | ICD-10-CM | POA: Diagnosis present

## 2018-07-21 DIAGNOSIS — S82142A Displaced bicondylar fracture of left tibia, initial encounter for closed fracture: Secondary | ICD-10-CM | POA: Diagnosis present

## 2018-07-21 DIAGNOSIS — J449 Chronic obstructive pulmonary disease, unspecified: Secondary | ICD-10-CM | POA: Diagnosis present

## 2018-07-21 DIAGNOSIS — Z880 Allergy status to penicillin: Secondary | ICD-10-CM | POA: Diagnosis not present

## 2018-07-21 DIAGNOSIS — I959 Hypotension, unspecified: Secondary | ICD-10-CM | POA: Diagnosis present

## 2018-07-21 DIAGNOSIS — Y92009 Unspecified place in unspecified non-institutional (private) residence as the place of occurrence of the external cause: Secondary | ICD-10-CM

## 2018-07-21 DIAGNOSIS — Z9841 Cataract extraction status, right eye: Secondary | ICD-10-CM

## 2018-07-21 DIAGNOSIS — E871 Hypo-osmolality and hyponatremia: Secondary | ICD-10-CM | POA: Diagnosis present

## 2018-07-21 DIAGNOSIS — I2510 Atherosclerotic heart disease of native coronary artery without angina pectoris: Secondary | ICD-10-CM | POA: Diagnosis present

## 2018-07-21 DIAGNOSIS — Z85828 Personal history of other malignant neoplasm of skin: Secondary | ICD-10-CM

## 2018-07-21 DIAGNOSIS — F329 Major depressive disorder, single episode, unspecified: Secondary | ICD-10-CM | POA: Diagnosis present

## 2018-07-21 DIAGNOSIS — M1990 Unspecified osteoarthritis, unspecified site: Secondary | ICD-10-CM | POA: Diagnosis present

## 2018-07-21 LAB — CBC WITH DIFFERENTIAL/PLATELET
Abs Immature Granulocytes: 0.09 10*3/uL — ABNORMAL HIGH (ref 0.00–0.07)
Basophils Absolute: 0 10*3/uL (ref 0.0–0.1)
Basophils Relative: 0 %
EOS ABS: 0.3 10*3/uL (ref 0.0–0.5)
EOS PCT: 2 %
HEMATOCRIT: 35.6 % — AB (ref 36.0–46.0)
HEMOGLOBIN: 12.1 g/dL (ref 12.0–15.0)
Immature Granulocytes: 1 %
LYMPHS ABS: 1.7 10*3/uL (ref 0.7–4.0)
LYMPHS PCT: 16 %
MCH: 29.2 pg (ref 26.0–34.0)
MCHC: 34 g/dL (ref 30.0–36.0)
MCV: 86 fL (ref 80.0–100.0)
MONOS PCT: 6 %
Monocytes Absolute: 0.6 10*3/uL (ref 0.1–1.0)
Neutro Abs: 8.1 10*3/uL — ABNORMAL HIGH (ref 1.7–7.7)
Neutrophils Relative %: 75 %
Platelets: 267 10*3/uL (ref 150–400)
RBC: 4.14 MIL/uL (ref 3.87–5.11)
RDW: 13.5 % (ref 11.5–15.5)
WBC: 10.8 10*3/uL — ABNORMAL HIGH (ref 4.0–10.5)
nRBC: 0 % (ref 0.0–0.2)

## 2018-07-21 LAB — URINALYSIS, ROUTINE W REFLEX MICROSCOPIC
Bilirubin Urine: NEGATIVE
GLUCOSE, UA: NEGATIVE mg/dL
Hgb urine dipstick: NEGATIVE
KETONES UR: NEGATIVE mg/dL
Leukocytes, UA: NEGATIVE
Nitrite: NEGATIVE
PH: 5 (ref 5.0–8.0)
Protein, ur: NEGATIVE mg/dL
Specific Gravity, Urine: 1.012 (ref 1.005–1.030)

## 2018-07-21 LAB — COMPREHENSIVE METABOLIC PANEL
ALBUMIN: 3.5 g/dL (ref 3.5–5.0)
ALT: 13 U/L (ref 0–44)
AST: 19 U/L (ref 15–41)
Alkaline Phosphatase: 49 U/L (ref 38–126)
Anion gap: 10 (ref 5–15)
BUN: 23 mg/dL (ref 8–23)
CALCIUM: 8.8 mg/dL — AB (ref 8.9–10.3)
CO2: 24 mmol/L (ref 22–32)
CREATININE: 1.21 mg/dL — AB (ref 0.44–1.00)
Chloride: 93 mmol/L — ABNORMAL LOW (ref 98–111)
GFR calc Af Amer: 46 mL/min — ABNORMAL LOW (ref >60)
GFR calc non Af Amer: 40 mL/min — ABNORMAL LOW (ref >60)
GLUCOSE: 103 mg/dL — AB (ref 70–99)
Potassium: 3.1 mmol/L — ABNORMAL LOW (ref 3.5–5.1)
SODIUM: 127 mmol/L — AB (ref 135–145)
Total Bilirubin: 0.5 mg/dL (ref 0.3–1.2)
Total Protein: 6.2 g/dL — ABNORMAL LOW (ref 6.5–8.1)

## 2018-07-21 LAB — BRAIN NATRIURETIC PEPTIDE: B Natriuretic Peptide: 64 pg/mL (ref 0.0–100.0)

## 2018-07-21 LAB — LACTIC ACID, PLASMA: Lactic Acid, Venous: 0.8 mmol/L (ref 0.5–1.9)

## 2018-07-21 LAB — TROPONIN I: Troponin I: 0.03 ng/mL (ref <0.03)

## 2018-07-21 LAB — MAGNESIUM: MAGNESIUM: 2.2 mg/dL (ref 1.7–2.4)

## 2018-07-21 MED ORDER — TRAZODONE HCL 100 MG PO TABS
200.0000 mg | ORAL_TABLET | Freq: Every day | ORAL | Status: DC
  Administered 2018-07-21 – 2018-07-22 (×2): 200 mg via ORAL
  Filled 2018-07-21 (×2): qty 2

## 2018-07-21 MED ORDER — HYDROCODONE-ACETAMINOPHEN 5-325 MG PO TABS
1.0000 | ORAL_TABLET | ORAL | Status: DC | PRN
  Administered 2018-07-21: 2 via ORAL
  Administered 2018-07-21 – 2018-07-22 (×6): 1 via ORAL
  Filled 2018-07-21 (×5): qty 1
  Filled 2018-07-21: qty 2
  Filled 2018-07-21: qty 1

## 2018-07-21 MED ORDER — SENNOSIDES-DOCUSATE SODIUM 8.6-50 MG PO TABS: 1.0000 | ORAL_TABLET | Freq: Every evening | ORAL | Status: DC | PRN

## 2018-07-21 MED ORDER — IPRATROPIUM BROMIDE 0.02 % IN SOLN
0.5000 mg | Freq: Once | RESPIRATORY_TRACT | Status: AC
  Administered 2018-07-21: 0.5 mg via RESPIRATORY_TRACT
  Filled 2018-07-21: qty 2.5

## 2018-07-21 MED ORDER — ENSURE ENLIVE PO LIQD
237.0000 mL | Freq: Three times a day (TID) | ORAL | Status: DC
  Administered 2018-07-22 – 2018-07-23 (×3): 237 mL via ORAL

## 2018-07-21 MED ORDER — CITALOPRAM HYDROBROMIDE 20 MG PO TABS
40.0000 mg | ORAL_TABLET | Freq: Every day | ORAL | Status: DC
  Administered 2018-07-21 – 2018-07-23 (×3): 40 mg via ORAL
  Filled 2018-07-21 (×3): qty 2

## 2018-07-21 MED ORDER — FENTANYL CITRATE (PF) 100 MCG/2ML IJ SOLN
50.0000 ug | Freq: Once | INTRAMUSCULAR | Status: AC
  Administered 2018-07-21: 50 ug via INTRAVENOUS
  Filled 2018-07-21: qty 2

## 2018-07-21 MED ORDER — ONDANSETRON HCL 4 MG PO TABS: 4.0000 mg | ORAL_TABLET | Freq: Four times a day (QID) | ORAL | Status: DC | PRN

## 2018-07-21 MED ORDER — POTASSIUM CHLORIDE CRYS ER 20 MEQ PO TBCR
40.0000 meq | EXTENDED_RELEASE_TABLET | Freq: Once | ORAL | Status: AC
  Administered 2018-07-21: 40 meq via ORAL
  Filled 2018-07-21: qty 2

## 2018-07-21 MED ORDER — BISACODYL 5 MG PO TBEC: 5.0000 mg | DELAYED_RELEASE_TABLET | Freq: Every day | ORAL | Status: DC | PRN

## 2018-07-21 MED ORDER — ONDANSETRON HCL 4 MG/2ML IJ SOLN: 4.0000 mg | Freq: Four times a day (QID) | INTRAMUSCULAR | Status: DC | PRN

## 2018-07-21 MED ORDER — SODIUM CHLORIDE 0.9 % IV SOLN
INTRAVENOUS | Status: DC
  Administered 2018-07-21 – 2018-07-22 (×2): via INTRAVENOUS

## 2018-07-21 MED ORDER — PREDNISONE 50 MG PO TABS
50.0000 mg | ORAL_TABLET | Freq: Every day | ORAL | Status: DC
  Administered 2018-07-22 – 2018-07-23 (×2): 50 mg via ORAL
  Filled 2018-07-21 (×2): qty 1

## 2018-07-21 MED ORDER — ENOXAPARIN SODIUM 40 MG/0.4ML ~~LOC~~ SOLN
40.0000 mg | SUBCUTANEOUS | Status: DC
  Administered 2018-07-21 – 2018-07-22 (×2): 40 mg via SUBCUTANEOUS
  Filled 2018-07-21 (×2): qty 0.4

## 2018-07-21 MED ORDER — ATORVASTATIN CALCIUM 20 MG PO TABS
20.0000 mg | ORAL_TABLET | Freq: Every day | ORAL | Status: DC
  Administered 2018-07-21 – 2018-07-23 (×3): 20 mg via ORAL
  Filled 2018-07-21 (×3): qty 1

## 2018-07-21 MED ORDER — PANTOPRAZOLE SODIUM 40 MG PO TBEC
40.0000 mg | DELAYED_RELEASE_TABLET | Freq: Every day | ORAL | Status: DC
  Administered 2018-07-21 – 2018-07-23 (×3): 40 mg via ORAL
  Filled 2018-07-21 (×3): qty 1

## 2018-07-21 MED ORDER — TIOTROPIUM BROMIDE MONOHYDRATE 18 MCG IN CAPS
18.0000 ug | ORAL_CAPSULE | Freq: Every day | RESPIRATORY_TRACT | Status: DC
  Administered 2018-07-22: 18 ug via RESPIRATORY_TRACT
  Filled 2018-07-21: qty 5

## 2018-07-21 MED ORDER — MOMETASONE FURO-FORMOTEROL FUM 200-5 MCG/ACT IN AERO
2.0000 | INHALATION_SPRAY | Freq: Two times a day (BID) | RESPIRATORY_TRACT | Status: DC
  Administered 2018-07-22 – 2018-07-23 (×3): 2 via RESPIRATORY_TRACT
  Filled 2018-07-21: qty 8.8

## 2018-07-21 MED ORDER — ACETAMINOPHEN 650 MG RE SUPP: 650.0000 mg | Freq: Four times a day (QID) | RECTAL | Status: DC | PRN

## 2018-07-21 MED ORDER — ALBUTEROL SULFATE (2.5 MG/3ML) 0.083% IN NEBU: 2.5000 mg | INHALATION_SOLUTION | RESPIRATORY_TRACT | Status: DC | PRN

## 2018-07-21 MED ORDER — METHYLPREDNISOLONE SODIUM SUCC 125 MG IJ SOLR
125.0000 mg | Freq: Once | INTRAMUSCULAR | Status: AC
  Administered 2018-07-21: 125 mg via INTRAVENOUS
  Filled 2018-07-21: qty 2

## 2018-07-21 MED ORDER — ACETAMINOPHEN 325 MG PO TABS: 650.0000 mg | ORAL_TABLET | Freq: Four times a day (QID) | ORAL | Status: DC | PRN

## 2018-07-21 MED ORDER — ALBUTEROL SULFATE (2.5 MG/3ML) 0.083% IN NEBU
5.0000 mg | INHALATION_SOLUTION | Freq: Once | RESPIRATORY_TRACT | Status: AC
  Administered 2018-07-21: 5 mg via RESPIRATORY_TRACT
  Filled 2018-07-21: qty 6

## 2018-07-21 MED ORDER — NICOTINE 14 MG/24HR TD PT24
14.0000 mg | MEDICATED_PATCH | Freq: Every day | TRANSDERMAL | Status: DC
  Administered 2018-07-21 – 2018-07-23 (×3): 14 mg via TRANSDERMAL
  Filled 2018-07-21 (×3): qty 1

## 2018-07-21 MED ORDER — KETOROLAC TROMETHAMINE 15 MG/ML IJ SOLN: 15.0000 mg | Freq: Four times a day (QID) | INTRAMUSCULAR | Status: DC | PRN

## 2018-07-21 NOTE — ED Triage Notes (Signed)
Pt arrived via ems for report of weakness for the last 2-3 days - ems reports hypotensive and end tidal of 26 - pt has hx of COPD - pt noted to be brady in 50's which is abnormal for her

## 2018-07-21 NOTE — ED Notes (Signed)
Pt fall risk changed after speaking with family

## 2018-07-21 NOTE — Clinical Social Work Placement (Signed)
   CLINICAL SOCIAL WORK PLACEMENT  NOTE  Date:  07/21/2018  Patient Details  Name: Kari Mcdonald MRN: [id number] Date of Birth: 1932/07/27  Clinical Social Work is seeking post-discharge placement for this patient at the [place] level of care (*CSW will initial, date and re-position this form in  chart as items are completed):  Yes   Patient/family provided with [place] Work Department's list of facilities offering this level of care within the geographic area requested by the patient (or if unable, by the patient's family).  Yes   Patient/family informed of their freedom to choose among providers that offer the needed level of care, that participate in Medicare, Medicaid or managed care program needed by the patient, have an available bed and are willing to accept the patient.  Yes   Patient/family informed of [facility]'s ownership interest in [facility] and [facility], as well as of the fact that they are under no obligation to receive care at these facilities.  PASRR submitted to EDS on       PASRR number received on       Existing PASRR number confirmed on 07/21/18     FL2 transmitted to all facilities in geographic area requested by pt/family on 07/21/18     FL2 transmitted to all facilities within larger geographic area on       Patient informed that his/her managed care company has contracts with or will negotiate with certain facilities, including the following:            Patient/family informed of bed offers received.  Patient chooses bed at       Physician recommends and patient chooses bed at      Patient to be transferred to   on  .  Patient to be transferred to facility by       Patient family notified on   of transfer.  Name of family member notified:        PHYSICIAN       Additional Comment:    _______________________________________________ Ninnie Fein, Veronia Beets, LCSW 07/21/2018, 4:21 PM

## 2018-07-21 NOTE — ED Notes (Signed)
Report given to Samantha RN

## 2018-07-21 NOTE — Clinical Social Work Note (Signed)
Clinical Social Work Assessment  Patient Details  Name: Kari Mcdonald MRN: [id number] Date of Birth: 06-06-1932  Date of referral:  07/21/18               Reason for consult:  Facility Placement                Permission sought to share information with:  Chartered certified accountant granted to share information::  Yes, Verbal Permission Granted  Name::      [place]::   [place]   Relationship::     Contact Information:     Housing/Transportation Living arrangements for the past 2 months:  [place] of Information:  Patient, Adult Children Patient Interpreter Needed:  None Criminal Activity/Legal Involvement Pertinent to Current Situation/Hospitalization:  No - Comment as needed Significant Relationships:  Adult Children Lives with:  Other (Comment)(grandson ) Do you feel safe going back to the place where you live?  Yes Need for family participation in patient care:  Yes (Comment)  Care giving concerns:  Patient lives in [place] with her grandson.    Social Worker assessment / plan:  Holiday representative (CSW) reviewed chart and noted that patient has a tibia fracture and PT is pending. CSW met with patient and her daughter Freda Munro and sister Rod Holler were at bedside. Patient was alert and oriented X3 and was laying in the bed. CSW introduced self and explained role of CSW department. Per daughter patient lives with her grandson who works often as an Forensic psychologist. CSW explained that PT will evaluate patient and make a recommendation of home health or SNF. Daughter prefers SNF and requested [facility]. Per daughter patient went to [facility] in 2018. CSW explained that [facility] will have to approve SNF. Daughter verbalized her understanding. Per daughter patient's son Chrissie Noa is patient's HPOA. FL2 complete and faxed out. CSW will continue to follow and assist as needed.    Employment status:  Retired Designer, industrial/product PT Recommendations:  Not assessed at this time Information / Referral to community resources:  [place]  Patient/Family's Response to care:  Patient's daughter requested SNF.   Patient/Family's Understanding of and Emotional Response to Diagnosis, Current Treatment, and Prognosis:  Patient and her daughter were very pleasant and thanked CSW for assistance.   Emotional Assessment Appearance:  Appears stated age Attitude/Demeanor/Rapport:    Affect (typically observed):  Accepting, Adaptable, Pleasant Orientation:  Oriented to Self, Oriented to Place, Oriented to  Time, Oriented to Situation Alcohol / Substance use:  Not Applicable Psych involvement (Current and /or in the community):  No (Comment)  Discharge Needs  Concerns to be addressed:  Discharge Planning Concerns Readmission within the last 30 days:  No Current discharge risk:  Dependent with Mobility Barriers to Discharge:  Continued Medical Work up   [employer], Veronia Beets, LCSW 07/21/2018, 4:24 PM

## 2018-07-21 NOTE — ED Notes (Signed)
Pt given meal tray at thsi time

## 2018-07-21 NOTE — Progress Notes (Signed)
Spoke with Dr. Leim Fabry to see if he wanted to order a polar for the patient. He does not at this time.

## 2018-07-21 NOTE — Progress Notes (Signed)
Advanced Care Plan.  Purpose of Encounter: CODE STATUS. Parties in Attendance: The patient, her daughter, son and sister, me. Patient's Decisional Capacity: Yes. Medical Story: Kari Mcdonald  is a 84 y.o. female with a known history of anemia, arthritis, skin cancer, COPD, CAD, diverticulosis, GI bleeding, GERD, hypertension, hyperlipidemia and MI etc. The patient is sent to ED due to generalized weakness and fall.  She is being admitted for hyponatremia, hypotension, acute respiratory failure with hypoxia due to COPD exacerbation and left tibial plateau fracture.  I discussed with the patient about her current condition, prognosis and code status.  The patient does not want to be resuscitated or intubated.  This is confirmed by family members. Plan:  Code Status: DNR. Time spent discussing advance care planning: 18 minutes.

## 2018-07-21 NOTE — H&P (Signed)
[facility] [place] NAME: Kari Mcdonald    MR#:  [id number]  DATE OF BIRTH:  Jun 11, 1932  DATE OF ADMISSION:  07/21/2018  PRIMARY CARE PHYSICIAN: Sofie Hartigan, MD   REQUESTING/REFERRING PHYSICIAN: Dr. Burlene Arnt.  CHIEF COMPLAINT:   Chief Complaint  Patient presents with  . Weakness  . Hypotension   Generalized weakness and fall. HISTORY OF PRESENT ILLNESS:  Kari Mcdonald  is a 84 y.o. female with a known history of anemia, arthritis, skin cancer, COPD, CAD, diverticulosis, GI bleeding, GERD, hypertension, hyperlipidemia and MI etc.  The patient is sent from home to ED due to above chief complaint.  She woke up and feel weak this morning.  She fell when she tried to get up and swelling to the ground.  She denies any syncope or loss of consciousness.  She was found hypotension at 80s and given IV fluid bolus by EMS.  She was found hypoxia at low 90s in the ED, put on oxygen by nasal cannula 2 L.  She complains of a left knee swelling and the pain.  X-rays show tibial plateau fracture.  Labs showed low sodium at 127.  Dr. Burlene Arnt requested admission.  PAST MEDICAL HISTORY:   Past Medical History:  Diagnosis Date  . Anemia   . Arthritis    left knee  . Cancer ([place])    Skin Cancer  . COPD (chronic obstructive pulmonary disease) ([place])   . Coronary artery disease   . Depression   . Diverticulosis   . Dysphagia   . Dyspnea   . GERD (gastroesophageal reflux disease)   . GI bleed   . Headache    from eye strain  . History of hiatal hernia   . HOH (hard of hearing)   . Hyperlipidemia   . Hypertension   . Myocardial infarction ([place])    2006  . Osteoporosis   . Wears dentures    full upper and lower    PAST SURGICAL HISTORY:   Past Surgical History:  Procedure Laterality Date  . ABDOMINAL HYSTERECTOMY    . APPENDECTOMY    . CATARACT EXTRACTION W/PHACO Left 08/06/2016   Procedure: CATARACT EXTRACTION PHACO AND  INTRAOCULAR LENS PLACEMENT (IOC);  Surgeon: Leandrew Koyanagi, MD;  Location: [facility];  Service: Ophthalmology;  Laterality: Left;  . CATARACT EXTRACTION W/PHACO Right 09/10/2016   Procedure: CATARACT EXTRACTION PHACO AND INTRAOCULAR LENS PLACEMENT (IOC);  Surgeon: Leandrew Koyanagi, MD;  Location: [facility];  Service: Ophthalmology;  Laterality: Right;  . CHOLECYSTECTOMY    . COLONOSCOPY    . COLONOSCOPY WITH PROPOFOL N/A 12/22/2016   Procedure: COLONOSCOPY WITH PROPOFOL;  Surgeon: Lucilla Lame, MD;  Location: [facility];  Service: Endoscopy;  Laterality: N/A;  . ESOPHAGOGASTRODUODENOSCOPY    . ESOPHAGOGASTRODUODENOSCOPY (EGD) WITH PROPOFOL N/A 07/21/2016   Procedure: ESOPHAGOGASTRODUODENOSCOPY (EGD) WITH PROPOFOL;  Surgeon: Lollie Sails, MD;  Location[facility] ENDOSCOPY;  Service: Endoscopy;  Laterality: N/A;  . GANGLION CYST EXCISION Left    wrist (x2)  . INTRAMEDULLARY (IM) NAIL INTERTROCHANTERIC Left 06/01/2017   Procedure: INTRAMEDULLARY (IM) NAIL INTERTROCHANTRIC;  Surgeon: Hessie Knows, MD;  Location: [facility];  Service: Orthopedics;  Laterality: Left;  . PILONIDAL CYST EXCISION      SOCIAL HISTORY:   Social History   Tobacco Use  . Smoking status: Current Every Day Smoker    Packs/day: 0.50    Years: 60.00    Pack years: 30.00  . Smokeless  tobacco: Never Used  Substance Use Topics  . Alcohol use: No    FAMILY HISTORY:   Family History  Problem Relation Age of Onset  . Alzheimer's disease Mother   . Lymphoma Father     DRUG ALLERGIES:   Allergies  Allergen Reactions  . Penicillins Rash    Has patient had a PCN reaction causing immediate rash, facial/tongue/throat swelling, SOB or lightheadedness with hypotension: Unknown Has patient had a PCN reaction causing severe rash involving mucus membranes or skin necrosis: Unknown Has patient had a PCN reaction that required hospitalization: Unknown Has patient had a PCN reaction  occurring within the last 10 years: Unknown If all of the above answers are "NO", then may proceed with Cephalosporin use.     REVIEW OF SYSTEMS:   Review of Systems  Constitutional: Positive for malaise/fatigue. Negative for chills and fever.  HENT: Negative for sore throat.   Eyes: Negative for blurred vision and double vision.  Respiratory: Positive for cough and shortness of breath. Negative for hemoptysis, sputum production, wheezing and stridor.   Cardiovascular: Negative for chest pain, palpitations, orthopnea and leg swelling.  Gastrointestinal: Negative for abdominal pain, blood in stool, diarrhea, melena, nausea and vomiting.  Genitourinary: Negative for dysuria, flank pain and hematuria.  Musculoskeletal: Positive for joint pain. Negative for back pain.       Left knee pain and swelling.  Skin: Negative for rash.  Neurological: Negative for dizziness, sensory change, focal weakness, seizures, loss of consciousness, weakness and headaches.  Endo/Heme/Allergies: Negative for polydipsia.  Psychiatric/Behavioral: Negative for depression. The patient is not nervous/anxious.     MEDICATIONS AT HOME:   Prior to Admission medications   Medication Sig Start Date End Date Taking? Authorizing Provider  albuterol (PROVENTIL HFA;VENTOLIN HFA) 108 (90 Base) MCG/ACT inhaler Inhale 2 puffs into the lungs every 6 (six) hours as needed for wheezing or shortness of breath.   Yes [provider]  atorvastatin (LIPITOR) 20 MG tablet Take 20 mg by mouth daily.   Yes [provider]  citalopram (CELEXA) 40 MG tablet Take 40 mg by mouth daily.   Yes [provider]  enalapril (VASOTEC) 20 MG tablet Take 20 mg by mouth daily.   Yes [provider]  feeding supplement, ENSURE ENLIVE, (ENSURE ENLIVE) LIQD Take 237 mLs by mouth 3 (three) times daily with meals. 11/19/16  Yes Mody, Ulice Bold, MD  Fluticasone-Salmeterol (ADVAIR) 250-50 MCG/DOSE AEPB Inhale 1 puff into the  lungs every 12 (twelve) hours.   Yes [provider]  omeprazole (PRILOSEC) 20 MG capsule Take 20 mg by mouth daily.   Yes [provider]  tiotropium (SPIRIVA) 18 MCG inhalation capsule Place 18 mcg into inhaler and inhale daily.   Yes [provider]  traZODone (DESYREL) 100 MG tablet Take 200 mg by mouth at bedtime.   Yes [provider]  triamterene-hydrochlorothiazide (MAXZIDE) 75-50 MG tablet Take 1 tablet by mouth daily.   Yes [provider]  enalapril (VASOTEC) 5 MG tablet Take 1 tablet (5 mg total) by mouth daily. Patient not taking: Reported on 07/21/2018 06/04/17   Gladstone Lighter, MD      VITAL SIGNS:  Blood pressure (!) 147/62, pulse (!) 52, temperature 98 F (36.7 C), temperature source Oral, resp. rate (!) 28, height 5\' 5"  (1.651 m), weight 68 kg, SpO2 99 %.  PHYSICAL EXAMINATION:  Physical Exam  GENERAL:  84 y.o.-year-old patient lying in the bed with no acute distress.  EYES: Pupils equal, round,  reactive to light and accommodation. No scleral icterus. Extraocular muscles intact.  HEENT: Head atraumatic, normocephalic. Oropharynx and nasopharynx clear.  NECK:  Supple, no jugular venous distention. No thyroid enlargement, no tenderness.  LUNGS: Normal breath sounds bilaterally, mild wheezing, no rales,rhonchi or crepitation. No use of accessory muscles of respiration.  CARDIOVASCULAR: S1, S2 normal. No murmurs, rubs, or gallops.  ABDOMEN: Soft, nontender, nondistended. Bowel sounds present. No organomegaly or mass.  EXTREMITIES: No pedal edema, cyanosis, or clubbing.  Left knee swelling and tenderness. NEUROLOGIC: Cranial nerves II through XII are intact. Muscle strength 3-4/5 in all extremities. Sensation intact. Gait not checked.  PSYCHIATRIC: The patient is alert and oriented x 3.  SKIN: No obvious rash, lesion, or ulcer.   LABORATORY PANEL:   CBC Recent Labs  Lab 07/21/18 0825  WBC 10.8*  HGB 12.1  HCT 35.6*    PLT 267   ------------------------------------------------------------------------------------------------------------------  Chemistries  Recent Labs  Lab 07/21/18 0825  NA 127*  K 3.1*  CL 93*  CO2 24  GLUCOSE 103*  BUN 23  CREATININE 1.21*  CALCIUM 8.8*  AST 19  ALT 13  ALKPHOS 49  BILITOT 0.5   ------------------------------------------------------------------------------------------------------------------  Cardiac Enzymes Recent Labs  Lab 07/21/18 0825  TROPONINI 0.03*   ------------------------------------------------------------------------------------------------------------------  RADIOLOGY:  Dg Chest 1 View  Result Date: 07/21/2018 CLINICAL DATA:  Shortness of breath EXAM: CHEST  1 VIEW COMPARISON:  06/03/2017 FINDINGS: The previously seen nodular opacity in the left suprahilar region is not well delineated on the current exam. There is no focal consolidation. There is no pleural effusion or pneumothorax. The heart and mediastinal contours are unremarkable. The osseous structures are unremarkable. IMPRESSION: No acute cardiopulmonary disease. Electronically Signed   By: Kathreen Devoid   On: 07/21/2018 09:03   Dg Knee Complete 4 Views Left  Result Date: 07/21/2018 CLINICAL DATA:  Acute left knee pain after fall today. EXAM: LEFT KNEE - COMPLETE 4+ VIEW COMPARISON:  None available currently. FINDINGS: Minimally displaced fracture is seen involving the medial tibial plateau with intra-articular extension. Severe narrowing of lateral joint space is noted consistent with degenerative joint disease. Fat fluid level is noted in the joint space. Status post intramedullary rod fixation of left femur. IMPRESSION: Minimally displaced medial tibial plateau fracture is noted with intra-articular extension. CT scan may be performed for further evaluation. Electronically Signed   By: Marijo Conception, M.D.   On: 07/21/2018 09:03      IMPRESSION AND PLAN:   Acute  hyponatremia. The patient will be admitted to medical floor. Hold Maxide, start normal saline IV and follow-up BMP.  Hypokalemia.  Give potassium supplement and follow-up level.  Hypotension.  The patient was given normal saline bolus blood pressure is better but is still soft.  Continue normal saline IV.  Hold Vasotec and Maxide.  Acute respiratory failure with hypoxia due to mild COPD exacerbation. DuoNeb as needed, prednisone, continue Dulera and Spiriva.  Left tibial plateau fracture.  Follow-up orthopedic surgeon for further recommendation.  Knee immobilizer.  Generalized weakness.  PT evaluation.  Tobacco abuse.  Smoking cessation was consulted for 4 minutes.  All the records are reviewed and case discussed with ED provider. Management plans discussed with the patient, daughter, son and sister and they are in agreement.  CODE STATUS: DNR.  TOTAL TIME TAKING CARE OF THIS PATIENT: 42 minutes.    Demetrios Loll M.D on 07/21/2018 at 10:30 AM  Between 7am to 6pm - Pager - [phone]  After 6pm go  to [email or web address] - Proofreader  [facility]  Office  [phone]  CC: Primary care physician; Sofie Hartigan, MD   Note: This dictation was prepared with Dragon dictation along with smaller phrase technology. Any transcriptional errors that result from this process are unin

## 2018-07-21 NOTE — ED Provider Notes (Addendum)
[facility] Emergency Department Provider Note  ____________________________________________   I have reviewed the triage vital signs and the nursing notes. Where available I have reviewed prior notes and, if possible and indicated, outside hospital notes.    HISTORY  Chief Complaint Weakness and Hypotension    HPI Kari Mcdonald is a 84 y.o. female with a history of COPD not on home oxygen, baseline oxygen saturation per EMS is low 90s, anemia arthritis of the left knee with chronic left knee pain, history of GI bleed in the past, history of ACS, not on blood thinners or beta-blocker per EMS notes, patient does have a chronic cough, which she states is baseline for her with her COPD, she states she might be coughing more recently.  Patient states that she woke up and felt weak this morning she tried to get up and sling to the ground.  She did not hit her head or pass out.  She is complaining of pain to her left knee which is chronic but seems worse this morning.  She does not know if she injured it.  She also states she has a chronic cough.  EMS reported her blood pressure was in the high 80s when they got there they gave her a bolus of fluid and came up to the 130s.  She denies any chest pain.  She denies any leg swelling, she denies any dysuria urinary frequency or fever.  He states "I just felt weak".       Past Medical History:  Diagnosis Date  . Anemia   . Arthritis    left knee  . Cancer ([place])    Skin Cancer  . COPD (chronic obstructive pulmonary disease) ([place])   . Coronary artery disease   . Depression   . Diverticulosis   . Dysphagia   . Dyspnea   . GERD (gastroesophageal reflux disease)   . GI bleed   . Headache    from eye strain  . History of hiatal hernia   . HOH (hard of hearing)   . Hyperlipidemia   . Hypertension   . Myocardial infarction ([place])    2006  . Osteoporosis   . Wears dentures    full upper and lower    Patient  Active Problem List   Diagnosis Date Noted  . Closed left hip fracture ([place]) 06/01/2017  . Noninfectious diarrhea   . Anemia 12/18/2016  . COPD (chronic obstructive pulmonary disease) ([place]) 12/18/2016  . Decreased hearing 12/18/2016  . Depression 12/18/2016  . GERD (gastroesophageal reflux disease) 12/18/2016  . GI bleed 12/18/2016  . Heart attack ([place]) 12/18/2016  . Hypertension 12/18/2016  . Knee osteoarthritis 12/18/2016  . Osteoporosis 12/18/2016  . Seasonal allergies 12/18/2016  . Skin cancer 12/18/2016  . AKI (acute kidney injury) ([place]) 11/17/2016  . Chronic diarrhea   . Loss of weight   . Intractable cyclical vomiting with nausea   . Hyperlipidemia, mixed 10/04/2014    Past Surgical History:  Procedure Laterality Date  . ABDOMINAL HYSTERECTOMY    . APPENDECTOMY    . CATARACT EXTRACTION W/PHACO Left 08/06/2016   Procedure: CATARACT EXTRACTION PHACO AND INTRAOCULAR LENS PLACEMENT (IOC);  Surgeon: Leandrew Koyanagi, MD;  Location: [place];  Service: Ophthalmology;  Laterality: Left;  . CATARACT EXTRACTION W/PHACO Right 09/10/2016   Procedure: CATARACT EXTRACTION PHACO AND INTRAOCULAR LENS PLACEMENT (IOC);  Surgeon: Leandrew Koyanagi, MD;  Location: [place];  Service: Ophthalmology;  Laterality: Right;  . CHOLECYSTECTOMY    .  COLONOSCOPY    . COLONOSCOPY WITH PROPOFOL N/A 12/22/2016   Procedure: COLONOSCOPY WITH PROPOFOL;  Surgeon: Lucilla Lame, MD;  Location: [place];  Service: Endoscopy;  Laterality: N/A;  . ESOPHAGOGASTRODUODENOSCOPY    . ESOPHAGOGASTRODUODENOSCOPY (EGD) WITH PROPOFOL N/A 07/21/2016   Procedure: ESOPHAGOGASTRODUODENOSCOPY (EGD) WITH PROPOFOL;  Surgeon: Lollie Sails, MD;  Location: [facility] ENDOSCOPY;  Service: Endoscopy;  Laterality: N/A;  . GANGLION CYST EXCISION Left    wrist (x2)  . INTRAMEDULLARY (IM) NAIL INTERTROCHANTERIC Left 06/01/2017   Procedure: INTRAMEDULLARY (IM) NAIL INTERTROCHANTRIC;  Surgeon: Hessie Knows, MD;  Location: [facility];  Service: Orthopedics;  Laterality: Left;  . PILONIDAL CYST EXCISION      Prior to Admission medications   Medication Sig Start Date End Date Taking? Authorizing Provider  albuterol (PROVENTIL HFA;VENTOLIN HFA) 108 (90 Base) MCG/ACT inhaler Inhale 2 puffs into the lungs every 6 (six) hours as needed for wheezing or shortness of breath.    [provider]  atorvastatin (LIPITOR) 20 MG tablet Take 20 mg by mouth daily.    [provider]  citalopram (CELEXA) 40 MG tablet Take 40 mg by mouth daily.    [provider]  dexlansoprazole (DEXILANT) 60 MG capsule Take 60 mg by mouth daily.    [provider]  docusate sodium (COLACE) 100 MG capsule Take 1 capsule (100 mg total) by mouth 2 (two) times daily. 06/04/17   Gladstone Lighter, MD  enalapril (VASOTEC) 5 MG tablet Take 1 tablet (5 mg total) by mouth daily. 06/04/17   Gladstone Lighter, MD  enoxaparin (LOVENOX) 40 MG/0.4ML injection Inject 0.4 mLs (40 mg total) into the skin daily. X 12 days and STOP 06/05/17   Gladstone Lighter, MD  feeding supplement, ENSURE ENLIVE, (ENSURE ENLIVE) LIQD Take 237 mLs by mouth 3 (three) times daily with meals. 11/19/16   Bettey Costa, MD  ferrous [id number] C-folic acid (TRINSICON / FOLTRIN) capsule Take 1 capsule by mouth 2 (two) times daily. 06/04/17   Gladstone Lighter, MD  guaiFENesin (MUCINEX) 600 MG 12 hr tablet Take 1 tablet (600 mg total) by mouth 2 (two) times daily. 06/04/17   Gladstone Lighter, MD  Lactobacillus (PROBIOTIC ACIDOPHILUS PO) Take 1 tablet by mouth daily.    [provider]  methocarbamol (ROBAXIN) 500 MG tablet Take 1 tablet (500 mg total) by mouth 3 (three) times daily. X 3 days 06/04/17   Gladstone Lighter, MD  oxyCODONE (OXY IR/ROXICODONE) 5 MG immediate release tablet Take 1 tablet (5 mg total) by mouth every 4 (four) hours as needed for moderate pain or severe pain. 06/04/17   Gladstone Lighter, MD   polyethylene glycol [facility]) packet Take 17 g by mouth daily. 06/04/17   Gladstone Lighter, MD  tiotropium (SPIRIVA) 18 MCG inhalation capsule Place 18 mcg into inhaler and inhale daily.    [provider]  traZODone (DESYREL) 100 MG tablet Take 200 mg by mouth at bedtime.    [provider]    Allergies Penicillins  No family history on file.  Social History Social History   Tobacco Use  . Smoking status: Current Every Day Smoker    Packs/day: 0.50    Years: 60.00    Pack years: 30.00  . Smokeless tobacco: Never Used  Substance Use Topics  . Alcohol use: No  . Drug use: No    Review of Systems Constitutional: No fever/chills Eyes: No visual changes. ENT: No sore throat. No stiff neck no neck pain Cardiovascular: Denies chest pain. Respiratory:  See HPI regarding shortness of breath. Gastrointestinal:   no vomiting.  No diarrhea.  No constipation. Genitourinary: Negative for dysuria. Musculoskeletal: Negative lower extremity swelling Skin: Negative for rash. Neurological: Negative for severe headaches, focal weakness or numbness.   ____________________________________________   PHYSICAL EXAM:  VITAL SIGNS: ED Triage Vitals  Enc Vitals Group     BP --      Pulse --      Resp --      Temp --      Temp src --      SpO2 --      Weight 07/21/18 0805 150 lb (68 kg)     Height 07/21/18 0805 5\' 5"  (1.651 m)     Head Circumference --      Peak Flow --      Pain Score 07/21/18 0804 10     Pain Loc --      Pain Edu? --      Excl. in [place]? --     Constitutional: Alert and oriented.  Ill-appearing but not in any acute distress at this moment Eyes: Conjunctivae are normal Head: Atraumatic HEENT: No congestion/rhinnorhea. Mucous membranes are moist.  Oropharynx non-erythematous Neck:   Nontender with no meningismus, no masses, no stridor Cardiovascular: Mild bradycardia rate 55, regular rhythm. Grossly normal heart sounds.  Good peripheral  circulation. Respiratory: Normal respiratory effort.  Diffuse occasional rhonchi most of which seem to clear with cough, diffuse very mild wheeze.  Diminished in the bases Abdominal: Soft and nontender. No distention. No guarding no rebound Back:  There is no focal tenderness or step off.  there is no midline tenderness there are no lesions noted. there is no CVA tenderness  Musculoskeletal: No tenderness to the hips bilaterally but she does have tenderness to the left knee which she states is chronic but there is some swelling anterior to the knee as well., no upper extremity tenderness. No joint effusions, no DVT signs strong distal pulses no edema Neurologic:  Normal speech and language. No gross focal neurologic deficits are appreciated.  Skin:  Skin is warm, dry and intact. No rash noted. Psychiatric: Mood and affect are normal. Speech and behavior are normal.  ____________________________________________   LABS (all labs ordered are listed, but only abnormal results are displayed)  Labs Reviewed  CULTURE, BLOOD (ROUTINE X 2)  CULTURE, BLOOD (ROUTINE X 2)  URINE CULTURE  LACTIC ACID, PLASMA  LACTIC ACID, PLASMA  COMPREHENSIVE METABOLIC PANEL  CBC WITH DIFFERENTIAL/PLATELET  URINALYSIS, ROUTINE W REFLEX MICROSCOPIC    Pertinent labs  results that were available during my care of the patient were reviewed by me and considered in my medical decision making (see chart for details). ____________________________________________  EKG  I personally interpreted any EKGs ordered by me or triage Sinus rhythm rate 50 bpm no acute ST elevation or depression, normal axis, nonspecific intraventricular conduction delay noted.  No acute ischemia. ____________________________________________  RADIOLOGY  Pertinent labs & imaging results that were available during my care of the patient were reviewed by me and considered in my medical decision making (see chart for details). If possible,  patient and/or family made aware of any abnormal findings.  No results found. ____________________________________________    PROCEDURES  Procedure(s) performed: None  Procedures  Critical Care performed: None  ____________________________________________   INITIAL IMPRESSION / ASSESSMENT AND PLAN / ED COURSE  Pertinent labs & imaging results that were available during my care of the patient were reviewed by me and considered in  my medical decision making (see chart for details).  Patient here after a fall no evidence of head injury I do not think CT scan is indicated appears to be at her baseline according to EMS and patient herself feels that she did not hit her head or pass out however she did have initially low blood pressure, therefore we will initiate a sepsis work-up, we will also check cardiac enzymes, and urinalysis.  Patient certainly has poor lungs and will get a chest x-ray, will also evaluate her left knee for acute trauma  ----------------------------------------- 9:48 AM on 07/21/2018 -----------------------------------------  And has a tibial plateau fracture I talked to Dr. Posey Pronto who does not feel operation is indicated but nonweightbearing and knee immobilizer are necessary.  Patient is low sodium we have given her IV fluid for that, and 500 cc of fluid.  His pressures are holding steady here.  She is however I think having a COPD flare, she has been nauseated according to family for the last week with no chest pain.  She did receive albuterol here her lungs are more clear, there is some faint wheeze but it is certainly better than it was, she is appearing to feel better.  Given her broken leg, living at home by herself, what appears to be a COPD flare and hyponatremia with reported hypotension in the field we will admit the patient for further evaluation.  Dr. Bridgett Larsson graciously accepts the patient.    ____________________________________________   FINAL CLINICAL  IMPRESSION(S) / ED DIAGNOSES  Final diagnoses:  None      This chart was dictated using voice recognition software.  Despite best efforts to proofread,  errors can occur which can change meaning.      Schuyler Amor, MD 07/21/18 5909    Schuyler Amor, MD 07/21/18 [phone]

## 2018-07-21 NOTE — Progress Notes (Signed)
Possible SEPSIS Pharmacy Consult - PHARMACY COMMUNICATION  >>Actually No Sepsis per MD >>  **Broad Spectrum Antibiotics should be administered within 1 hour of Sepsis diagnosis**  Time Code Sepsis Called/Page Received: Pharmacy consult entered 0805 but no page received  Antibiotics Ordered: none  Time of 1st antibiotic administration: n/a per discussion with ED MD.   Additional action taken by pharmacy: called RN Helene Kelp @ [phone] and secure chat messaged Dr. Burlene Arnt.  Per MD antibiotics do not need to be ordered for sepsis.  If necessary, Name of Provider/Nurse Contacted: Teresa RN/  Dr. Zena Amos ,PharmD Clinical Pharmacist  07/21/2018  9:45 AM

## 2018-07-21 NOTE — Consult Note (Signed)
ORTHOPAEDIC CONSULTATION  REQUESTING PHYSICIAN: Demetrios Loll, MD  Chief Complaint:   L knee pain  History of Present Illness: Kari Mcdonald is a 84 y.o. female who had a fall earlier today.  The patient noted immediate knee pain and inability to ambulate.  The patient ambulates with a cane at baseline.  She lives at home with her grandson, although he is not home too often due to work.  Pain is described as sharp at its worst and a dull ache at its best.  Pain is rated a 8/10 in severity.  Pain is improved with rest and immobilization.  Pain is worse with any sort of movement.  X-rays in the emergency department show a minimally displaced left medial tibial plateau fracture.  Past Medical History:  Diagnosis Date  . Anemia   . Arthritis    left knee  . Cancer ([place])    Skin Cancer  . COPD (chronic obstructive pulmonary disease) ([place])   . Coronary artery disease   . Depression   . Diverticulosis   . Dysphagia   . Dyspnea   . GERD (gastroesophageal reflux disease)   . GI bleed   . Headache    from eye strain  . History of hiatal hernia   . HOH (hard of hearing)   . Hyperlipidemia   . Hypertension   . Myocardial infarction ([place])    2006  . Osteoporosis   . Wears dentures    full upper and lower   Past Surgical History:  Procedure Laterality Date  . ABDOMINAL HYSTERECTOMY    . APPENDECTOMY    . CATARACT EXTRACTION W/PHACO Left 08/06/2016   Procedure: CATARACT EXTRACTION PHACO AND INTRAOCULAR LENS PLACEMENT (IOC);  Surgeon: Leandrew Koyanagi, MD;  Location: [place];  Service: Ophthalmology;  Laterality: Left;  . CATARACT EXTRACTION W/PHACO Right 09/10/2016   Procedure: CATARACT EXTRACTION PHACO AND INTRAOCULAR LENS PLACEMENT (IOC);  Surgeon: Leandrew Koyanagi, MD;  Location: [place];  Service: Ophthalmology;  Laterality: Right;  . CHOLECYSTECTOMY    . COLONOSCOPY    . COLONOSCOPY  WITH PROPOFOL N/A 12/22/2016   Procedure: COLONOSCOPY WITH PROPOFOL;  Surgeon: Lucilla Lame, MD;  Location: [place];  Service: Endoscopy;  Laterality: N/A;  . ESOPHAGOGASTRODUODENOSCOPY    . ESOPHAGOGASTRODUODENOSCOPY (EGD) WITH PROPOFOL N/A 07/21/2016   Procedure: ESOPHAGOGASTRODUODENOSCOPY (EGD) WITH PROPOFOL;  Surgeon: Lollie Sails, MD;  Location: [facility] ENDOSCOPY;  Service: Endoscopy;  Laterality: N/A;  . GANGLION CYST EXCISION Left    wrist (x2)  . INTRAMEDULLARY (IM) NAIL INTERTROCHANTERIC Left 06/01/2017   Procedure: INTRAMEDULLARY (IM) NAIL INTERTROCHANTRIC;  Surgeon: Hessie Knows, MD;  Location: [facility];  Service: Orthopedics;  Laterality: Left;  . PILONIDAL CYST EXCISION     Social History   Socioeconomic History  . Marital status: Widowed    Spouse name: Not on file  . Number of children: Not on file  . Years of education: Not on file  . Highest education level: Not on file  Occupational History  . Not on file  Social Needs  . Financial resource strain: Not on file  . Food insecurity:    Worry: Not on file    Inability: Not on file  . Transportation needs:    Medical: Not on file    Non-medical: Not on file  Tobacco Use  . Smoking status: Current Every Day Smoker    Packs/day: 0.50    Years: 60.00    Pack years: 30.00  . Smokeless tobacco: Never Used  Substance and Sexual Activity  . Alcohol use: No  . Drug use: No  . Sexual activity: Not on file  Lifestyle  . Physical activity:    Days per week: Not on file    Minutes per session: Not on file  . Stress: Not on file  Relationships  . Social connections:    Talks on phone: Not on file    Gets together: Not on file    Attends religious service: Not on file    Active member of club or organization: Not on file    Attends meetings of clubs or organizations: Not on file    Relationship status: Not on file  Other Topics Concern  . Not on file  Social History Narrative  . Not on file   Family  History  Problem Relation Age of Onset  . Alzheimer's disease Mother   . Lymphoma Father    Allergies  Allergen Reactions  . Penicillins Rash    Has patient had a PCN reaction causing immediate rash, facial/tongue/throat swelling, SOB or lightheadedness with hypotension: Unknown Has patient had a PCN reaction causing severe rash involving mucus membranes or skin necrosis: Unknown Has patient had a PCN reaction that required hospitalization: Unknown Has patient had a PCN reaction occurring within the last 10 years: Unknown If all of the above answers are "NO", then may proceed with Cephalosporin use.    Prior to Admission medications   Medication Sig Start Date End Date Taking? Authorizing Provider  albuterol (PROVENTIL HFA;VENTOLIN HFA) 108 (90 Base) MCG/ACT inhaler Inhale 2 puffs into the lungs every 6 (six) hours as needed for wheezing or shortness of breath.   Yes [provider]  atorvastatin (LIPITOR) 20 MG tablet Take 20 mg by mouth daily.   Yes [provider]  citalopram (CELEXA) 40 MG tablet Take 40 mg by mouth daily.   Yes [provider]  enalapril (VASOTEC) 20 MG tablet Take 20 mg by mouth daily.   Yes [provider]  feeding supplement, ENSURE ENLIVE, (ENSURE ENLIVE) LIQD Take 237 mLs by mouth 3 (three) times daily with meals. 11/19/16  Yes Mody, Ulice Bold, MD  Fluticasone-Salmeterol (ADVAIR) 250-50 MCG/DOSE AEPB Inhale 1 puff into the lungs every 12 (twelve) hours.   Yes [provider]  omeprazole (PRILOSEC) 20 MG capsule Take 20 mg by mouth daily.   Yes [provider]  tiotropium (SPIRIVA) 18 MCG inhalation capsule Place 18 mcg into inhaler and inhale daily.   Yes [provider]  traZODone (DESYREL) 100 MG tablet Take 200 mg by mouth at bedtime.   Yes [provider]  triamterene-hydrochlorothiazide (MAXZIDE) 75-50 MG tablet Take 1 tablet by mouth daily.   Yes [provider]  enalapril (VASOTEC)  5 MG tablet Take 1 tablet (5 mg total) by mouth daily. Patient not taking: Reported on 07/21/2018 06/04/17   Gladstone Lighter, MD   Recent Labs    07/21/18 0825  WBC 10.8*  HGB 12.1  HCT 35.6*  PLT 267  K 3.1*  CL 93*  CO2 24  BUN 23  CREATININE 1.21*  GLUCOSE 103*  CALCIUM 8.8*   Dg Chest 1 View  Result Date: 07/21/2018 CLINICAL DATA:  Shortness of breath EXAM: CHEST  1 VIEW COMPARISON:  06/03/2017 FINDINGS: The previously seen nodular opacity in the left suprahilar region is not well delineated on the current exam. There is no focal consolidation. There is no pleural effusion or pneumothorax. The heart and mediastinal contours are unremarkable. The  osseous structures are unremarkable. IMPRESSION: No acute cardiopulmonary disease. Electronically Signed   By: Kathreen Devoid   On: 07/21/2018 09:03   Dg Knee Complete 4 Views Left  Result Date: 07/21/2018 CLINICAL DATA:  Acute left knee pain after fall today. EXAM: LEFT KNEE - COMPLETE 4+ VIEW COMPARISON:  None available currently. FINDINGS: Minimally displaced fracture is seen involving the medial tibial plateau with intra-articular extension. Severe narrowing of lateral joint space is noted consistent with degenerative joint disease. Fat fluid level is noted in the joint space. Status post intramedullary rod fixation of left femur. IMPRESSION: Minimally displaced medial tibial plateau fracture is noted with intra-articular extension. CT scan may be performed for further evaluation. Electronically Signed   By: Marijo Conception, M.D.   On: 07/21/2018 09:03     Positive ROS: All other systems have been reviewed and were otherwise negative with the exception of those mentioned in the HPI and as above.  Physical Exam: BP (!) 103/51   Pulse (!) 56   Temp 98 F (36.7 C) (Oral)   Resp 13   Ht 5\' 5"  (1.651 m)   Wt 68 kg   SpO2 91%   BMI 24.96 kg/m  General:  Alert, no acute distress Psychiatric:  Patient is competent for consent with  normal mood and affect   Cardiovascular:  No pedal edema, regular rate and rhythm Respiratory:  No wheezing, non-labored breathing GI:  Abdomen is soft and non-tender Skin:  No lesions in the area of chief complaint, no erythema Neurologic:  Sensation intact distally, CN grossly intact Lymphatic:  No axillary or cervical lymphadenopathy  Orthopedic Exam:  LLE: + DF/PF/EHL SILT grossly over foot Foot wwp Significant tenderness about medial tibial plateau, less so over lateral tibial plateau Significant knee effusion    X-rays:  As above: minimally displaced left medial tibial plateau fracture.  Assessment/Plan: Kari Mcdonald is a 84 y.o. female with a L minimally displaced left medial tibial plateau fracture.   1.  We discussed the various treatment options including both surgical and nonsurgical management of the fracture.  We agreed to proceed with nonsurgical management given the minimal amount of displacement.  We discussed that the risks of nonsurgical management would be further progression of already existing knee osteoarthritis.  The patient is understanding and wishes to proceed with nonsurgical management.  2.  Nonweightbearing on left lower extremity.  3.  Knee immobilizer to left lower extremity.  4.  Physical therapy and Occupational Therapy.  5.  Follow-up with me in approximately 2 weeks.  Please page with any questions in the interim while she is admitted.      Leim Fabry   07/21/2018 1:07 PM

## 2018-07-21 NOTE — ED Notes (Signed)
Dietary called and will send pt's meal tray

## 2018-07-21 NOTE — NC FL2 (Signed)
[place] LEVEL OF CARE SCREENING TOOL     IDENTIFICATION  Patient Name: Kari Mcdonald Birthdate: 1931-12-27 Sex: female Admission Date (Current Location): 07/21/2018  [place] and [place] Number:  Engineering geologist and Address:  [facility], [address], [place], [place] [address]      Provider Number: [id number]  Attending Physician Name and Address:  Demetrios Loll, MD  Relative Name and Phone Number:       Current Level of Care: Hospital Recommended Level of Care: [place] Prior Approval Number:    Date Approved/Denied:   PASRR Number: ([id number])  Discharge Plan: SNF    Current Diagnoses: Patient Active Problem List   Diagnosis Date Noted  . Hyponatremia 07/21/2018  . Closed left hip fracture ([place]) 06/01/2017  . Noninfectious diarrhea   . Anemia 12/18/2016  . COPD (chronic obstructive pulmonary disease) ([place]) 12/18/2016  . Decreased hearing 12/18/2016  . Depression 12/18/2016  . GERD (gastroesophageal reflux disease) 12/18/2016  . GI bleed 12/18/2016  . Heart attack ([place]) 12/18/2016  . Hypertension 12/18/2016  . Knee osteoarthritis 12/18/2016  . Osteoporosis 12/18/2016  . Seasonal allergies 12/18/2016  . Skin cancer 12/18/2016  . AKI (acute kidney injury) ([place]) 11/17/2016  . Chronic diarrhea   . Loss of weight   . Intractable cyclical vomiting with nausea   . Hyperlipidemia, mixed 10/04/2014    Orientation RESPIRATION BLADDER Height & Weight     Self, Time, Situation, Place  Normal Continent Weight: 150 lb (68 kg) Height:  5\' 5"  (165.1 cm)  BEHAVIORAL SYMPTOMS/MOOD NEUROLOGICAL BOWEL NUTRITION STATUS      Continent Diet(Diet: Regular )  AMBULATORY STATUS COMMUNICATION OF NEEDS Skin   Extensive Assist Verbally Normal                       Personal Care Assistance Level of Assistance  Bathing, Feeding, Dressing Bathing Assistance: Limited assistance Feeding assistance:  Independent Dressing Assistance: Limited assistance     Functional Limitations Info  Sight, Hearing, Speech Sight Info: Adequate Hearing Info: Adequate Speech Info: Adequate    SPECIAL CARE FACTORS FREQUENCY  PT (By licensed PT), OT (By licensed OT)     PT Frequency: (5) OT Frequency: (5)            Contractures      Additional Factors Info  Code Status, Allergies Code Status Info: (DNR ) Allergies Info: (Penicillins)           Current Medications (07/21/2018):  This is the current hospital active medication list Current Facility-Administered Medications  Medication Dose Route Frequency Provider Last Rate Last Dose  . 0.9 %  sodium chloride infusion   Intravenous Continuous Demetrios Loll, MD 75 mL/hr at 07/21/18 1447    . acetaminophen (TYLENOL) tablet 650 mg  650 mg Oral Q6H PRN Demetrios Loll, MD       Or  . acetaminophen (TYLENOL) suppository 650 mg  650 mg Rectal Q6H PRN Demetrios Loll, MD      . albuterol (PROVENTIL) (2.5 MG/3ML) 0.083% nebulizer solution 2.5 mg  2.5 mg Nebulization Q2H PRN Demetrios Loll, MD      . atorvastatin (LIPITOR) tablet 20 mg  20 mg Oral Daily Demetrios Loll, MD   20 mg at 07/21/18 1438  . bisacodyl (DULCOLAX) EC tablet 5 mg  5 mg Oral Daily PRN Demetrios Loll, MD      . citalopram (CELEXA) tablet 40 mg  40 mg Oral Daily Bridgett Larsson,  Qing, MD   40 mg at 07/21/18 1437  . enoxaparin (LOVENOX) injection 40 mg  40 mg Subcutaneous Q24H Demetrios Loll, MD      . feeding supplement (ENSURE ENLIVE) (ENSURE ENLIVE) liquid 237 mL  237 mL Oral TID WC Demetrios Loll, MD      . HYDROcodone-acetaminophen (NORCO/VICODIN) 5-325 MG per tablet 1-2 tablet  1-2 tablet Oral Q4H PRN Demetrios Loll, MD   2 tablet at 07/21/18 1437  . ketorolac (TORADOL) 15 MG/ML injection 15 mg  15 mg Intravenous Q6H PRN Demetrios Loll, MD      . mometasone-formoterol [facility]) 200-5 MCG/ACT inhaler 2 puff  2 puff Inhalation BID Demetrios Loll, MD      . nicotine (NICODERM CQ - dosed in mg/24 hours) patch 14 mg  14 mg  Transdermal Daily Demetrios Loll, MD   14 mg at 07/21/18 1438  . ondansetron (ZOFRAN) tablet 4 mg  4 mg Oral Q6H PRN Demetrios Loll, MD       Or  . ondansetron [facility]) injection 4 mg  4 mg Intravenous Q6H PRN Demetrios Loll, MD      . pantoprazole (PROTONIX) EC tablet 40 mg  40 mg Oral Daily Demetrios Loll, MD   40 mg at 07/21/18 1438  . [START ON 07/22/2018] predniSONE (DELTASONE) tablet 50 mg  50 mg Oral Q breakfast Demetrios Loll, MD      . senna-docusate (Senokot-S) tablet 1 tablet  1 tablet Oral QHS PRN Demetrios Loll, MD      . tiotropium [facility]) inhalation capsule [facility] use ONLY) 18 mcg  18 mcg Inhalation Daily Demetrios Loll, MD      . traZODone (DESYREL) tablet 200 mg  200 mg Oral QHS Demetrios Loll, MD         Discharge Medications: Please see discharge summary for a list of discharge medications.  Relevant Imaging Results:  Relevant Lab Results:   Additional Information (SSN: [id number])  Alaycia Eardley, Veronia Beets, LCSW

## 2018-07-22 LAB — BASIC METABOLIC PANEL
ANION GAP: 11 (ref 5–15)
BUN: 23 mg/dL (ref 8–23)
CALCIUM: 9.2 mg/dL (ref 8.9–10.3)
CO2: 21 mmol/L — AB (ref 22–32)
CREATININE: 1.11 mg/dL — AB (ref 0.44–1.00)
Chloride: 95 mmol/L — ABNORMAL LOW (ref 98–111)
GFR, EST AFRICAN AMERICAN: 51 mL/min — AB (ref >60)
GFR, EST NON AFRICAN AMERICAN: 44 mL/min — AB (ref >60)
Glucose, Bld: 106 mg/dL — ABNORMAL HIGH (ref 70–99)
Potassium: 3.3 mmol/L — ABNORMAL LOW (ref 3.5–5.1)
Sodium: 127 mmol/L — ABNORMAL LOW (ref 135–145)

## 2018-07-22 LAB — CBC
HCT: 33.2 % — ABNORMAL LOW (ref 36.0–46.0)
Hemoglobin: 11.2 g/dL — ABNORMAL LOW (ref 12.0–15.0)
MCH: 29.5 pg (ref 26.0–34.0)
MCHC: 33.7 g/dL (ref 30.0–36.0)
MCV: 87.4 fL (ref 80.0–100.0)
NRBC: 0 % (ref 0.0–0.2)
PLATELETS: 238 10*3/uL (ref 150–400)
RBC: 3.8 MIL/uL — AB (ref 3.87–5.11)
RDW: 13.6 % (ref 11.5–15.5)
WBC: 13.4 10*3/uL — ABNORMAL HIGH (ref 4.0–10.5)

## 2018-07-22 LAB — URINE CULTURE: CULTURE: NO GROWTH

## 2018-07-22 MED ORDER — IPRATROPIUM-ALBUTEROL 0.5-2.5 (3) MG/3ML IN SOLN
3.0000 mL | RESPIRATORY_TRACT | Status: DC
  Administered 2018-07-22 (×2): 3 mL via RESPIRATORY_TRACT
  Filled 2018-07-22 (×2): qty 3

## 2018-07-22 MED ORDER — GUAIFENESIN-DM 100-10 MG/5ML PO SYRP: 5.0000 mL | ORAL_SOLUTION | ORAL | Status: DC | PRN

## 2018-07-22 MED ORDER — POTASSIUM CHLORIDE CRYS ER 20 MEQ PO TBCR
40.0000 meq | EXTENDED_RELEASE_TABLET | Freq: Once | ORAL | Status: AC
  Administered 2018-07-22: 40 meq via ORAL
  Filled 2018-07-22: qty 2

## 2018-07-22 MED ORDER — IPRATROPIUM-ALBUTEROL 0.5-2.5 (3) MG/3ML IN SOLN
3.0000 mL | Freq: Four times a day (QID) | RESPIRATORY_TRACT | Status: DC
  Administered 2018-07-23: 3 mL via RESPIRATORY_TRACT
  Filled 2018-07-22: qty 3

## 2018-07-22 NOTE — Progress Notes (Signed)
Per [facility] admissions coordinator at [facility] SNF authorization has been received and patient can D/C to [facility] when medically stable. Per MD patient may D/C tomorrow. Patient and her daughter Kari Mcdonald are aware of above. CSW will continue to follow and assist as needed.   [employer], LCSW [phone]

## 2018-07-22 NOTE — Progress Notes (Signed)
[place] [facility] [place] NAME: Kari Mcdonald    MR#:  [id number]  DATE OF BIRTH:  1932-08-02  SUBJECTIVE:  CHIEF COMPLAINT:   Chief Complaint  Patient presents with  . Weakness  . Hypotension   -Left tibial plateau fracture.  Complains of pain.  Also has dyspnea, denies any chest pain.  Complains of cough  REVIEW OF SYSTEMS:  Review of Systems  Constitutional: Negative for chills, fever and malaise/fatigue.  HENT: Negative for congestion, ear discharge, hearing loss, nosebleeds and sinus pain.   Respiratory: Positive for cough and shortness of breath. Negative for wheezing.   Cardiovascular: Negative for chest pain, palpitations and leg swelling.  Gastrointestinal: Negative for abdominal pain, constipation, diarrhea, nausea and vomiting.  Genitourinary: Negative for dysuria.  Musculoskeletal: Positive for joint pain.  Neurological: Negative for dizziness, focal weakness, seizures, weakness and headaches.  Psychiatric/Behavioral: Negative for depression.    DRUG ALLERGIES:   Allergies  Allergen Reactions  . Penicillins Rash    Has patient had a PCN reaction causing immediate rash, facial/tongue/throat swelling, SOB or lightheadedness with hypotension: Unknown Has patient had a PCN reaction causing severe rash involving mucus membranes or skin necrosis: Unknown Has patient had a PCN reaction that required hospitalization: Unknown Has patient had a PCN reaction occurring within the last 10 years: Unknown If all of the above answers are "NO", then may proceed with Cephalosporin use.     VITALS:  Blood pressure (!) 161/57, pulse (!) 59, temperature 97.8 F (36.6 C), temperature source Oral, resp. rate 18, height 5\' 5"  (1.651 m), weight 68 kg, SpO2 100 %.  PHYSICAL EXAMINATION:  Physical Exam   GENERAL:  84 y.o.-year-old patient lying in the bed with no acute distress.  EYES: Pupils equal, round, reactive to light and  accommodation. No scleral icterus. Extraocular muscles intact.  HEENT: Head atraumatic, normocephalic. Oropharynx and nasopharynx clear.  NECK:  Supple, no jugular venous distention. No thyroid enlargement, no tenderness.  LUNGS: Moving air bilaterally, has diffuse scattered expiratory wheeze, decreased at the bases.  No  rales,rhonchi or crepitation. No use of accessory muscles of respiration.  CARDIOVASCULAR: S1, S2 normal. No  rubs, or gallops.  2/6 systolic murmur is present aBDOMEN: Soft, nontender, nondistended. Bowel sounds present. No organomegaly or mass.  EXTREMITIES: No pedal edema, cyanosis, or clubbing.  Left leg is immobilized NEUROLOGIC: Cranial nerves II through XII are intact. Muscle strength 5/5 in all extremities. Sensation intact. Gait not checked.  PSYCHIATRIC: The patient is alert and oriented x 3.  SKIN: No obvious rash, lesion, or ulcer.    LABORATORY PANEL:   CBC Recent Labs  Lab 07/22/18 0711  WBC 13.4*  HGB 11.2*  HCT 33.2*  PLT 238   ------------------------------------------------------------------------------------------------------------------  Chemistries  Recent Labs  Lab 07/21/18 0825 07/22/18 0711  NA 127* 127*  K 3.1* 3.3*  CL 93* 95*  CO2 24 21*  GLUCOSE 103* 106*  BUN 23 23  CREATININE 1.21* 1.11*  CALCIUM 8.8* 9.2  MG 2.2  --   AST 19  --   ALT 13  --   ALKPHOS 49  --   BILITOT 0.5  --    ------------------------------------------------------------------------------------------------------------------  Cardiac Enzymes Recent Labs  Lab 07/21/18 0825  TROPONINI 0.03*   ------------------------------------------------------------------------------------------------------------------  RADIOLOGY:  Dg Chest 1 View  Result Date: 07/21/2018 CLINICAL DATA:  Shortness of breath EXAM: CHEST  1 VIEW COMPARISON:  06/03/2017 FINDINGS: The previously seen nodular opacity in the left  suprahilar region is not well delineated on the  current exam. There is no focal consolidation. There is no pleural effusion or pneumothorax. The heart and mediastinal contours are unremarkable. The osseous structures are unremarkable. IMPRESSION: No acute cardiopulmonary disease. Electronically Signed   By: Kathreen Devoid   On: 07/21/2018 09:03   Dg Knee Complete 4 Views Left  Result Date: 07/21/2018 CLINICAL DATA:  Acute left knee pain after fall today. EXAM: LEFT KNEE - COMPLETE 4+ VIEW COMPARISON:  None available currently. FINDINGS: Minimally displaced fracture is seen involving the medial tibial plateau with intra-articular extension. Severe narrowing of lateral joint space is noted consistent with degenerative joint disease. Fat fluid level is noted in the joint space. Status post intramedullary rod fixation of left femur. IMPRESSION: Minimally displaced medial tibial plateau fracture is noted with intra-articular extension. CT scan may be performed for further evaluation. Electronically Signed   By: Marijo Conception, M.D.   On: 07/21/2018 09:03    EKG:   Orders placed or performed during the hospital encounter of 07/21/18  . ED EKG 12-Lead  . ED EKG 12-Lead  . EKG 12-Lead  . EKG 12-Lead    ASSESSMENT AND PLAN:   84 year old female with past medical history significant for COPD, CAD, GI bleed, GERD, hypertension and anemia presents to hospital secondary to fall and left leg pain  1.  Left tibial plateau fracture-status post fall, appreciate orthopedics consult -Nonsurgical.  Conservative management recommended.  Immobilized - PT consult, rehab at discharge  2. 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  3.  COPD-stable.  Cough meds as needed -Inhalers and nebulizers -On prednisone orally  4.  GERD-Protonix  5.  Tobacco use disorder-on nicotine patch  6.  DVT prophylaxis-on Lovenox  Physical therapy recommended  rehab   All the records are reviewed and case discussed with Care Management/Social Workerr. Management plans discussed with the patient, family and they are in agreement.  CODE STATUS: DNR  TOTAL TIME TAKING CARE OF THIS PATIENT: 38 minutes.   POSSIBLE D/C IN 2 DAYS, DEPENDING ON CLINICAL CONDITION.   Gladstone Lighter M.D on 07/22/2018 at 2:41 PM  Between 7am to 6pm - Pager - [phone]  After 6pm go to [email or web address] - password [email or web address] [place] [facility]  Office  [phone]  CC: Primary care physician; Sofie Hartigan, MD

## 2018-07-22 NOTE — Evaluation (Signed)
Physical Therapy Evaluation Patient Details Name: JOYCELIN Mcdonald MRN: [id number] DOB: 1932/07/22 Today's Date: 07/22/2018   History of Present Illness   84 y.o. female with a known history of anemia, arthritis, skin cancer, COPD, CAD, diverticulosis, GI bleeding, GERD, hypertension, hyperlipidemia and MI.  She had a fall and was subsequently found to have had a L tibial plateau fx.    Clinical Impression  Pt showed good effort with PT exam and did relatively well with keeping weight off the L LE in standing but fatigued very quickly and could not tolerate more than very minimal standing/transfer to the recliner.  She needed some assist to get to sitting and heavy assist to attain standing.  She did have some pain but was not overly limited regarding this.  She showed good effort and wants to go home but realizes that the only safe option at this point is rehab.      Follow Up Recommendations SNF    Equipment Recommendations       Recommendations for Other Services       Precautions / Restrictions Precautions Precautions: Fall Required Braces or Orthoses: Knee Immobilizer - Left Restrictions Weight Bearing Restrictions: Yes LLE Weight Bearing: Non weight bearing      Mobility  Bed Mobility Overal bed mobility: Needs Assistance Bed Mobility: Supine to Sit     Supine to sit: Min assist     General bed mobility comments: Pt was able to initiate movement and get nearly to sitting, did need assist with guiding L LE off bed and light assist to fully get to upright sitting  Transfers Overall transfer level: Needs assistance Equipment used: Rolling walker (2 wheeled) Transfers: Sit to/from Stand Sit to Stand: Mod assist         General transfer comment: Attempted to rise with RW and only CGA with inability to initiate upward movement.  Needed elevated surface and relatively heavy assist to get to standing.  Highly reliant on walker but able to maintain NWBing on L.  Pt  did not control descent to recliner well.  Ambulation/Gait Ambulation/Gait assistance: Mod assist Gait Distance (Feet): 3 Feet Assistive device: Rolling walker (2 wheeled)       General Gait Details: KI donned, pt able to keep L foot off ground.  Pt fatigued quickly and needed to sit relatively quickly after the effort.   Stairs            Wheelchair Mobility    Modified Rankin (Stroke Patients Only)       Balance Overall balance assessment: Needs assistance Sitting-balance support: Bilateral upper extremity supported Sitting balance-Leahy Scale: Good Sitting balance - Comments: Pt able to maintain sitting EOB w/o assist   Standing balance support: Bilateral upper extremity supported Standing balance-Leahy Scale: Fair Standing balance comment: Highly reliant on the walker, unable to maintian prolonged standing secondary to quick fatigue                             Pertinent Vitals/Pain Pain Assessment: 0-10 Pain Score: 3 (increases with activity, but not severe)    Home Living Family/patient expects to be discharged to:: Skilled nursing facility Living Arrangements: Other relatives(grandson, rarely home) Available Help at Discharge: Family Type of Home: House Home Access: Stairs to enter Entrance Stairs-Rails: Left;Right(wide) Technical brewer of Steps: 4 Home Layout: One level Home Equipment: Environmental consultant - 2 wheels;Cane - single point      Prior Function Level of Independence:  Independent(used cane out side, out of home weekly, does not drive)         Comments: Pt has had one additional fall in the last 6 months     Hand Dominance        Extremity/Trunk Assessment   Upper Extremity Assessment Upper Extremity Assessment: Generalized weakness(age appropriate limitations)    Lower Extremity Assessment Lower Extremity Assessment: Generalized weakness(L in KI, grossly 3+/5 hip and ankle, R grossly 4-/5 t/o)       Communication    Communication: HOH  Cognition Arousal/Alertness: Awake/alert Behavior During Therapy: Anxious Overall Cognitive Status: Within Functional Limits for tasks assessed                                        General Comments      Exercises     Assessment/Plan    PT Assessment Patient needs continued PT services  PT Problem List Decreased strength;Decreased range of motion;Decreased activity tolerance;Decreased balance;Decreased mobility;Decreased cognition;Pain       PT Treatment Interventions DME instruction;Gait training;Stair training;Functional mobility training;Therapeutic activities;Therapeutic exercise;Balance training;Neuromuscular re-education;Patient/family education    PT Goals (Current goals can be found in the Care Plan section)  Acute Rehab PT Goals Patient Stated Goal: get back home PT Goal Formulation: With patient Time For Goal Achievement: 08/05/18 Potential to Achieve Goals: Good    Frequency 7X/week   Barriers to discharge        Co-evaluation               AM-PAC PT "6 Clicks" Daily Activity  Outcome Measure Difficulty turning over in bed (including adjusting bedclothes, sheets and blankets)?: A Lot Difficulty moving from lying on back to sitting on the side of the bed? : Unable Difficulty sitting down on and standing up from a chair with arms (e.g., wheelchair, bedside commode, etc,.)?: Unable Help needed moving to and from a bed to chair (including a wheelchair)?: A Lot Help needed walking in hospital room?: Total Help needed climbing 3-5 steps with a railing? : Total 6 Click Score: 8    End of Session Equipment Utilized During Treatment: Gait belt Activity Tolerance: Patient tolerated treatment well   Nurse Communication: Mobility status PT Visit Diagnosis: Muscle weakness (generalized) (M62.81);Difficulty in walking, not elsewhere classified (R26.2)    Time: 7517-0017 PT Time Calculation (min) (ACUTE ONLY): 25  min   Charges:   PT Evaluation $PT Eval Low Complexity: 1 Low          Kreg Shropshire, DPT 07/22/2018, 10:38 AM

## 2018-07-22 NOTE — Plan of Care (Signed)

## 2018-07-22 NOTE — Progress Notes (Signed)
PT is recommending SNF. Clinical Education officer, museum (CSW) presented bed offers to patient and her daughter Freda Munro. They chose [facility]. Per [facility] admissions coordinator at [place] he will start [facility] SNF authorization today. CSW will continue to follow and assist as needed.   [employer], LCSW [phone]

## 2018-07-23 ENCOUNTER — Inpatient Hospital Stay
Admit: 2018-07-23 | Discharge: 2018-07-23 | Disposition: A | Discharge status: Home / Self Care | Payer: Medicare Other | Attending: Internal Medicine | Admitting: Internal Medicine

## 2018-07-23 LAB — BASIC METABOLIC PANEL
ANION GAP: 8 (ref 5–15)
BUN: 21 mg/dL (ref 8–23)
CHLORIDE: 102 mmol/L (ref 98–111)
CO2: 23 mmol/L (ref 22–32)
Calcium: 9.3 mg/dL (ref 8.9–10.3)
Creatinine, Ser: 1.07 mg/dL — ABNORMAL HIGH (ref 0.44–1.00)
GFR calc Af Amer: 54 mL/min — ABNORMAL LOW (ref >60)
GFR, EST NON AFRICAN AMERICAN: 46 mL/min — AB (ref >60)
GLUCOSE: 148 mg/dL — AB (ref 70–99)
POTASSIUM: 4.1 mmol/L (ref 3.5–5.1)
Sodium: 133 mmol/L — ABNORMAL LOW (ref 135–145)

## 2018-07-23 LAB — ECHOCARDIOGRAM COMPLETE
Height: 65 in
Weight: 2400 oz

## 2018-07-23 MED ORDER — PREDNISONE 20 MG PO TABS: 40.0000 mg | ORAL_TABLET | Freq: Every day | ORAL | 0 refills | Status: AC

## 2018-07-23 MED ORDER — IPRATROPIUM-ALBUTEROL 0.5-2.5 (3) MG/3ML IN SOLN
3.0000 mL | Freq: Three times a day (TID) | RESPIRATORY_TRACT | Status: DC
  Administered 2018-07-23: 3 mL via RESPIRATORY_TRACT
  Filled 2018-07-23: qty 3

## 2018-07-23 MED ORDER — NICOTINE 14 MG/24HR TD PT24: 14.0000 mg | MEDICATED_PATCH | Freq: Every day | TRANSDERMAL | 0 refills | Status: DC

## 2018-07-23 MED ORDER — ALBUTEROL SULFATE (2.5 MG/3ML) 0.083% IN NEBU
2.5000 mg | INHALATION_SOLUTION | RESPIRATORY_TRACT | Status: DC | PRN
  Administered 2018-07-23: 2.5 mg via RESPIRATORY_TRACT
  Filled 2018-07-23: qty 3

## 2018-07-23 MED ORDER — SENNOSIDES-DOCUSATE SODIUM 8.6-50 MG PO TABS: 1.0000 | ORAL_TABLET | Freq: Two times a day (BID) | ORAL | 0 refills | Status: AC

## 2018-07-23 MED ORDER — ENALAPRIL MALEATE 5 MG PO TABS: 5.0000 mg | ORAL_TABLET | Freq: Every day | ORAL | 0 refills | Status: DC

## 2018-07-23 MED ORDER — FUROSEMIDE 20 MG PO TABS: 20.0000 mg | ORAL_TABLET | Freq: Every day | ORAL | 0 refills | Status: DC | PRN

## 2018-07-23 MED ORDER — HYDROCODONE-ACETAMINOPHEN 5-325 MG PO TABS: 1.0000 | ORAL_TABLET | Freq: Four times a day (QID) | ORAL | 0 refills | Status: DC | PRN

## 2018-07-23 MED ORDER — IPRATROPIUM-ALBUTEROL 0.5-2.5 (3) MG/3ML IN SOLN: 3.0000 mL | Freq: Four times a day (QID) | RESPIRATORY_TRACT | 0 refills | Status: DC | PRN

## 2018-07-23 NOTE — Discharge Summary (Signed)
[facility] NAME: Kari Mcdonald    MR#:  [id number]  DATE OF BIRTH:  [birth year]  DATE OF ADMISSION:  07/21/2018   ADMITTING PHYSICIAN: Demetrios Loll, MD  DATE OF DISCHARGE:  07/23/18  PRIMARY CARE PHYSICIAN: Sofie Hartigan, MD   ADMISSION DIAGNOSIS:   Hypotension, unspecified hypotension type [I95.9]  DISCHARGE DIAGNOSIS:   Active Problems:   Hyponatremia   SECONDARY DIAGNOSIS:   Past Medical History:  Diagnosis Date  . Anemia   . Arthritis    left knee  . Cancer ([place])    Skin Cancer  . COPD (chronic obstructive pulmonary disease) ([place])   . Coronary artery disease   . Depression   . Diverticulosis   . Dysphagia   . Dyspnea   . GERD (gastroesophageal reflux disease)   . GI bleed   . Headache    from eye strain  . History of hiatal hernia   . HOH (hard of hearing)   . Hyperlipidemia   . Hypertension   . Myocardial infarction ([place])    2006  . Osteoporosis   . Wears dentures    full upper and lower    HOSPITAL COURSE:   84 year old female with past medical history significant for COPD, CAD, GI bleed, GERD, hypertension and anemia presents to hospital secondary to fall and left leg pain  1.  Left tibial plateau fracture-status post fall, appreciate orthopedics consult -Nonsurgical.  Conservative management recommended. -Nonweightbearing on left lower extremity. - Knee immobilizer to left lower extremity.  - PT consult, rehab at discharge  2. Hyponatremia- likely SIADH, discontinued IV fluids- - started fluid restriction and now sodium improving to 133 - may be lasix as needed -Discontinue hydrochlorothiazide -Echocardiogram ordered.f/u as outpatient  3.  COPD-stable.  Cough meds as needed -Inhalers and nebulizers -On prednisone orally- taper as outpatient  4.  GERD-PPI  5.  Tobacco use disorder-on nicotine patch counseled   Physical therapy recommended rehab- discharge  today   DISCHARGE CONDITIONS:   Guarded'  CONSULTS OBTAINED:   Ortho consult by Dr. Leim Fabry  DRUG ALLERGIES:   Allergies  Allergen Reactions  . Penicillins Rash    Has patient had a PCN reaction causing immediate rash, facial/tongue/throat swelling, SOB or lightheadedness with hypotension: Unknown Has patient had a PCN reaction causing severe rash involving mucus membranes or skin necrosis: Unknown Has patient had a PCN reaction that required hospitalization: Unknown Has patient had a PCN reaction occurring within the last 10 years: Unknown If all of the above answers are "NO", then may proceed with Cephalosporin use.    DISCHARGE MEDICATIONS:   Allergies as of 07/23/2018      Reactions   Penicillins Rash   Has patient had a PCN reaction causing immediate rash, facial/tongue/throat swelling, SOB or lightheadedness with hypotension: Unknown Has patient had a PCN reaction causing severe rash involving mucus membranes or skin necrosis: Unknown Has patient had a PCN reaction that required hospitalization: Unknown Has patient had a PCN reaction occurring within the last 10 years: Unknown If all of the above answers are "NO", then may proceed with Cephalosporin use.      Medication List    STOP taking these medications   triamterene-hydrochlorothiazide 75-50 MG tablet Commonly known as:  MAXZIDE     TAKE these medications   albuterol 108 (90 Base) MCG/ACT inhaler Commonly known as:  PROVENTIL HFA;VENTOLIN HFA Inhale 2 puffs into the lungs every 6 (six) hours as  needed for wheezing or shortness of breath.   atorvastatin 20 MG tablet Commonly known as:  LIPITOR Take 20 mg by mouth daily.   citalopram 40 MG tablet Commonly known as:  CELEXA Take 40 mg by mouth daily.   enalapril 5 MG tablet Commonly known as:  VASOTEC Take 1 tablet (5 mg total) by mouth daily. What changed:    medication strength  how much to take  Another medication with the same name was  removed. Continue taking this medication, and follow the directions you see here.   feeding supplement (ENSURE ENLIVE) Liqd Take 237 mLs by mouth 3 (three) times daily with meals.   Fluticasone-Salmeterol 250-50 MCG/DOSE Aepb Commonly known as:  ADVAIR Inhale 1 puff into the lungs every 12 (twelve) hours.   furosemide 20 MG tablet Commonly known as:  LASIX Take 1 tablet (20 mg total) by mouth daily as needed for fluid (dyspnea).   HYDROcodone-acetaminophen 5-325 MG tablet Commonly known as:  NORCO/VICODIN Take 1-2 tablets by mouth every 6 (six) hours as needed for moderate pain or severe pain.   ipratropium-albuterol 0.5-2.5 (3) MG/3ML Soln Commonly known as:  DUONEB Take 3 mLs by nebulization every 6 (six) hours as needed (wheezing, shortness of breath).   nicotine 14 mg/24hr patch Commonly known as:  NICODERM CQ - dosed in mg/24 hours Place 1 patch (14 mg total) onto the skin daily. Start taking on:  07/24/2018   omeprazole 20 MG capsule Commonly known as:  PRILOSEC Take 20 mg by mouth daily.   predniSONE 20 MG tablet Commonly known as:  DELTASONE Take 2 tablets (40 mg total) by mouth daily with breakfast for 5 days.   senna-docusate 8.6-50 MG tablet Commonly known as:  Senokot-S Take 1 tablet by mouth 2 (two) times daily for 20 days.   tiotropium 18 MCG inhalation capsule Commonly known as:  SPIRIVA Place 18 mcg into inhaler and inhale daily.   traZODone 100 MG tablet Commonly known as:  DESYREL Take 200 mg by mouth at bedtime.        DISCHARGE INSTRUCTIONS:   1. Orthopedics follow up in 2 weeks 2.  Nonweightbearing on left lower extremity. 3.  Knee immobilizer to left lower extremity. 4. PCP f/u in 1-2 weeks 5. Fluid restriction of 1200cc/day  DIET:   Cardiac diet  ACTIVITY:   Activity as tolerated  OXYGEN:   Home Oxygen: No.  Oxygen Delivery: room air  DISCHARGE LOCATION:   nursing home   If you experience worsening of your admission  symptoms, develop shortness of breath, life threatening emergency, suicidal or homicidal thoughts you must seek medical attention immediately by calling 911 or calling your MD immediately  if symptoms less severe.  You Must read complete instructions/literature along with all the possible adverse reactions/side effects for all the Medicines you take and that have been prescribed to you. Take any new Medicines after you have completely understood and accpet all the possible adverse reactions/side effects.   Please note  You were cared for by a hospitalist during your hospital stay. If you have any questions about your discharge medications or the care you received while you were in the hospital after you are discharged, you can call the unit and asked to speak with the hospitalist on call if the hospitalist that took care of you is not available. Once you are discharged, your primary care physician will handle any further medical issues. Please note that NO REFILLS for any discharge medications will be authorized  once you are discharged, as it is imperative that you return to your primary care physician (or establish a relationship with a primary care physician if you do not have one) for your aftercare needs so that they can reassess your need for medications and monitor your lab values.    On the day of Discharge:  VITAL SIGNS:   Blood pressure (!) 139/96, pulse 82, temperature 97.8 F (36.6 C), temperature source Axillary, resp. rate 18, height 5\' 5"  (1.651 m), weight 68 kg, SpO2 96 %.  PHYSICAL EXAMINATION:    GENERAL:  84 y.o.-year-old patient lying in the bed with no acute distress.  EYES: Pupils equal, round, reactive to light and accommodation. No scleral icterus. Extraocular muscles intact.  HEENT: Head atraumatic, normocephalic. Oropharynx and nasopharynx clear.  NECK:  Supple, no jugular venous distention. No thyroid enlargement, no tenderness.  LUNGS: Moving air bilaterally, has  diffuse scattered expiratory wheeze, decreased at the bases.  No  rales,rhonchi or crepitation. No use of accessory muscles of respiration.  CARDIOVASCULAR: S1, S2 normal. No  rubs, or gallops.  2/6 systolic murmur is present  ABDOMEN: Soft, nontender, nondistended. Bowel sounds present. No organomegaly or mass.  EXTREMITIES: No pedal edema, cyanosis, or clubbing.  Left leg is immobilized NEUROLOGIC: Cranial nerves II through XII are intact. Muscle strength 5/5 in all extremities. Sensation intact. Gait not checked.  PSYCHIATRIC: The patient is alert and oriented x 3.  SKIN: No obvious rash, lesion, or ulcer.    DATA REVIEW:   CBC Recent Labs  Lab 07/22/18 0711  WBC 13.4*  HGB 11.2*  HCT 33.2*  PLT 238    Chemistries  Recent Labs  Lab 07/21/18 0825  07/23/18 0428  NA 127*   < > 133*  K 3.1*   < > 4.1  CL 93*   < > 102  CO2 24   < > 23  GLUCOSE 103*   < > 148*  BUN 23   < > 21  CREATININE 1.21*   < > 1.07*  CALCIUM 8.8*   < > 9.3  MG 2.2  --   --   AST 19  --   --   ALT 13  --   --   ALKPHOS 49  --   --   BILITOT 0.5  --   --    < > = values in this interval not displayed.     Microbiology Results  Results for orders placed or performed during the hospital encounter of 07/21/18  Blood Culture (routine x 2)     Status: None (Preliminary result)   Collection Time: 07/21/18  8:05 AM  Result Value Ref Range Status   Specimen Description BLOOD RIGHT [facility]  Final   Special Requests   Final    BOTTLES DRAWN AEROBIC AND ANAEROBIC Blood Culture adequate volume   Culture   Final    NO GROWTH 2 DAYS Performed at [facility], [address] [place], [place] [address]    Report Status PENDING  Incomplete  Blood Culture (routine x 2)     Status: None (Preliminary result)   Collection Time: 07/21/18  8:10 AM  Result Value Ref Range Status   Specimen Description BLOOD LEFT AC  Final   Special Requests   Final    BOTTLES DRAWN AEROBIC AND ANAEROBIC Blood Culture  adequate volume   Culture   Final    NO GROWTH 2 DAYS Performed at [facility], [place],  [place], [place] [address]    Report Status PENDING  Incomplete  Urine culture     Status: None   Collection Time: 07/21/18  8:25 AM  Result Value Ref Range Status   Specimen Description   Final    URINE, RANDOM Performed at [facility], [address], [place], [place] [address]    Special Requests   Final    NONE Performed at [facility], [address], [place], [place] [address]    Culture   Final    NO GROWTH Performed at [facility], [place] [address], [place], [place] [address]    Report Status 07/22/2018 FINAL  Final    RADIOLOGY:  No results found.   Management plans discussed with the patient, family and they are in agreement.  CODE STATUS:     Code Status Orders  (From admission, onward)         Start     Ordered   07/21/18 1427  Do not attempt resuscitation (DNR)  Continuous    Question Answer Comment  In the event of cardiac or respiratory ARREST Do not call a "code blue"   In the event of cardiac or respiratory ARREST Do not perform Intubation, CPR, defibrillation or ACLS   In the event of cardiac or respiratory ARREST Use medication by any route, position, wound care, and other measures to relive pain and suffering. May use oxygen, suction and manual treatment of airway obstruction as needed for comfort.   Comments RN  MAY PRONOUNCE      07/21/18 1426        Code Status History    Date Active Date Inactive Code Status Order ID Comments User Context   06/01/2017 1247 06/04/2017 1720 DNR [id number]  Nicholes Mango, MD Inpatient   11/17/2016 1433 11/19/2016 1746 DNR [id number]  Nicholes Mango, MD Inpatient      TOTAL TIME TAKING CARE OF THIS PATIENT: 38 minutes.    Gladstone Lighter M.D on 07/23/2018 at 2:06 PM  Between 7am to 6pm - Pager - [phone]  After 6pm go to [email or web address] - Proofreader  [facility]  Office  [phone]  CC: Primary care physician; Sofie Hartigan, MD   Note: This dictation was prepared with Dragon dictation along with smaller phrase technology. Any transcriptional errors that result from this process are unintentional.

## 2018-07-23 NOTE — Progress Notes (Signed)
Patient is medically stable for D/C to [facility] today. Per admissions coordinator at [facility] authorization has been recevied and patient can come today to room E-15. RN will call report and arrange EMS for transport. Clinical Education officer, museum (CSW) sent D/C summary to [employer] via [facility]. Patient and her daughter Freda Munro are aware of above. Freda Munro asked about long term care after rehab. CSW explained that patient will have to pay out of pocket or get long term care medicaid. Per patient she already has medicaid. CSW explained that she will have to switch her medicaid over to long term care mediciad throuhg DSS. Freda Munro verbalized her understading. Please reconsult if future social work needs arise. CSW signing off.   [employer], LCSW [phone]

## 2018-07-23 NOTE — Care Management Important Message (Signed)
Copy of signed IM left with patient in room.  

## 2018-07-23 NOTE — Clinical Social Work Placement (Signed)
   CLINICAL SOCIAL WORK PLACEMENT  NOTE  Date:  07/23/2018  Patient Details  Name: Kari Mcdonald MRN: [id number] Date of Birth: 1932-04-08  Clinical Social Work is seeking post-discharge placement for this patient at the [place] level of care (*CSW will initial, date and re-position this form in  chart as items are completed):  Yes   Patient/family provided with [facility] Work Department's list of facilities offering this level of care within the geographic area requested by the patient (or if unable, by the patient's family).  Yes   Patient/family informed of their freedom to choose among providers that offer the needed level of care, that participate in Medicare, Medicaid or managed care program needed by the patient, have an available bed and are willing to accept the patient.  Yes   Patient/family informed of [facility]'s ownership interest in [facility] and [facility], as well as of the fact that they are under no obligation to receive care at these facilities.  PASRR submitted to EDS on       PASRR number received on       Existing PASRR number confirmed on 07/21/18     FL2 transmitted to all facilities in geographic area requested by pt/family on 07/21/18     FL2 transmitted to all facilities within larger geographic area on       Patient informed that his/her managed care company has contracts with or will negotiate with certain facilities, including the following:        Yes   Patient/family informed of bed offers received.  Patient chooses bed at [facility] )     Physician recommends and patient chooses bed at      Patient to be [facility] to [facility] ) on 07/23/18.  Patient to be transferred to facility by [facility] )     Patient family notified on 07/23/18 of transfer.  Name of family member notified:  (Patient's daughter Freda Munro is at bedside and aware of D/C today. )     PHYSICIAN        Additional Comment:    _______________________________________________ Florice Hindle, Veronia Beets, LCSW 07/23/2018, 2:28 PM

## 2018-07-23 NOTE — Progress Notes (Signed)
*  PRELIMINARY RESULTS* Echocardiogram 2D Echocardiogram has been performed.  Kari Mcdonald 07/23/2018, 10:14 AM

## 2018-07-26 LAB — CULTURE, BLOOD (ROUTINE X 2)
Culture: NO GROWTH
Culture: NO GROWTH
Special Requests: ADEQUATE
Special Requests: ADEQUATE

## 2018-08-28 ENCOUNTER — Emergency Department
Admission: EM | Admit: 2018-08-28 | Discharge: 2018-08-28 | Disposition: A | Discharge status: Home / Self Care | Payer: Medicare Other | Attending: Emergency Medicine | Admitting: Emergency Medicine

## 2018-08-28 ENCOUNTER — Emergency Department: Payer: Medicare Other

## 2018-08-28 DIAGNOSIS — I252 Old myocardial infarction: Secondary | ICD-10-CM | POA: Diagnosis not present

## 2018-08-28 DIAGNOSIS — F329 Major depressive disorder, single episode, unspecified: Secondary | ICD-10-CM | POA: Insufficient documentation

## 2018-08-28 DIAGNOSIS — L03116 Cellulitis of left lower limb: Secondary | ICD-10-CM | POA: Diagnosis not present

## 2018-08-28 DIAGNOSIS — Z85828 Personal history of other malignant neoplasm of skin: Secondary | ICD-10-CM | POA: Diagnosis not present

## 2018-08-28 DIAGNOSIS — F17200 Nicotine dependence, unspecified, uncomplicated: Secondary | ICD-10-CM | POA: Diagnosis not present

## 2018-08-28 DIAGNOSIS — M79605 Pain in left leg: Secondary | ICD-10-CM | POA: Diagnosis present

## 2018-08-28 DIAGNOSIS — Z9049 Acquired absence of other specified parts of digestive tract: Secondary | ICD-10-CM | POA: Diagnosis not present

## 2018-08-28 DIAGNOSIS — I2510 Atherosclerotic heart disease of native coronary artery without angina pectoris: Secondary | ICD-10-CM | POA: Insufficient documentation

## 2018-08-28 DIAGNOSIS — Z79899 Other long term (current) drug therapy: Secondary | ICD-10-CM | POA: Diagnosis not present

## 2018-08-28 DIAGNOSIS — I10 Essential (primary) hypertension: Secondary | ICD-10-CM | POA: Insufficient documentation

## 2018-08-28 DIAGNOSIS — J449 Chronic obstructive pulmonary disease, unspecified: Secondary | ICD-10-CM | POA: Diagnosis not present

## 2018-08-28 LAB — PROTIME-INR
INR: 1.01
Prothrombin Time: 13.2 seconds (ref 11.4–15.2)

## 2018-08-28 LAB — CBC
HEMATOCRIT: 35.1 % — AB (ref 36.0–46.0)
HEMOGLOBIN: 10.9 g/dL — AB (ref 12.0–15.0)
MCH: 29.1 pg (ref 26.0–34.0)
MCHC: 31.1 g/dL (ref 30.0–36.0)
MCV: 93.6 fL (ref 80.0–100.0)
NRBC: 0 % (ref 0.0–0.2)
Platelets: 181 10*3/uL (ref 150–400)
RBC: 3.75 MIL/uL — ABNORMAL LOW (ref 3.87–5.11)
RDW: 14.5 % (ref 11.5–15.5)
WBC: 5.6 10*3/uL (ref 4.0–10.5)

## 2018-08-28 LAB — COMPREHENSIVE METABOLIC PANEL
ALBUMIN: 3.3 g/dL — AB (ref 3.5–5.0)
ALK PHOS: 98 U/L (ref 38–126)
ALT: 16 U/L (ref 0–44)
AST: 20 U/L (ref 15–41)
Anion gap: 7 (ref 5–15)
BILIRUBIN TOTAL: 0.5 mg/dL (ref 0.3–1.2)
BUN: 12 mg/dL (ref 8–23)
CALCIUM: 9.1 mg/dL (ref 8.9–10.3)
CO2: 27 mmol/L (ref 22–32)
Chloride: 105 mmol/L (ref 98–111)
Creatinine, Ser: 0.98 mg/dL (ref 0.44–1.00)
GFR calc Af Amer: 60 mL/min — ABNORMAL LOW (ref >60)
GFR calc non Af Amer: 52 mL/min — ABNORMAL LOW (ref >60)
GLUCOSE: 92 mg/dL (ref 70–99)
Potassium: 4.1 mmol/L (ref 3.5–5.1)
Sodium: 139 mmol/L (ref 135–145)
TOTAL PROTEIN: 5.7 g/dL — AB (ref 6.5–8.1)

## 2018-08-28 LAB — APTT: aPTT: 34 seconds (ref 24–36)

## 2018-08-28 MED ORDER — FENTANYL CITRATE (PF) 100 MCG/2ML IJ SOLN
50.0000 ug | Freq: Once | INTRAMUSCULAR | Status: AC
  Administered 2018-08-28: 50 ug via INTRAVENOUS
  Filled 2018-08-28: qty 2

## 2018-08-28 MED ORDER — CEPHALEXIN 500 MG PO CAPS: 500.0000 mg | ORAL_CAPSULE | Freq: Two times a day (BID) | ORAL | 0 refills | Status: DC

## 2018-08-28 MED ORDER — CEPHALEXIN 500 MG PO CAPS
500.0000 mg | ORAL_CAPSULE | Freq: Once | ORAL | Status: AC
  Administered 2018-08-28: 500 mg via ORAL
  Filled 2018-08-28: qty 1

## 2018-08-28 NOTE — ED Notes (Signed)
Pt given a turkey sandwich to eat.

## 2018-08-28 NOTE — ED Notes (Signed)
Iv established but unable to obtain labs. Pt to [place]

## 2018-08-28 NOTE — ED Notes (Signed)
Pt discharged awaiting ems to [facility]. Moved to 5h

## 2018-08-28 NOTE — ED Triage Notes (Signed)
Pt presents today via [facility] for pain management. Pt has had a fall approx 1 mth ago and obtained a fracture near knee cap. Pt placed in a knee immobilizer and lives at [place]. Pt had xray done yesterday that showed a new fracture in the tib/fib area. [facility] told ems that pt was to have doppler done Monday. Pt states the pain is tingling and burning with pain to LLE. Pt has a [facility] slip in her paperwork.

## 2018-08-28 NOTE — ED Notes (Signed)
[facility] called for transport back to [facility]

## 2018-08-28 NOTE — ED Provider Notes (Signed)
[facility],The Emergency Department Provider Note   ____________________________________________    I have reviewed the triage vital signs and the nursing notes.   HISTORY  Chief Complaint Leg Pain     HPI Kari Mcdonald is a 84 y.o. female who presents with complaints of left leg pain.  Patient reports she has a tibial plateau fracture of the left leg but reports her knee has been feeling significantly improved however recently she is developed redness along the anterior lower leg which is causing a burning pain.  She was supposed to have an ultrasound performed at hospitals today but apparently this was unable to happen so she opted to come to the emergency department to have an ultrasound performed   Past Medical History:  Diagnosis Date  . Anemia   . Arthritis    left knee  . Cancer ([place])    Skin Cancer  . COPD (chronic obstructive pulmonary disease) ([place])   . Coronary artery disease   . Depression   . Diverticulosis   . Dysphagia   . Dyspnea   . GERD (gastroesophageal reflux disease)   . GI bleed   . Headache    from eye strain  . History of hiatal hernia   . HOH (hard of hearing)   . Hyperlipidemia   . Hypertension   . Myocardial infarction ([place])    2006  . Osteoporosis   . Wears dentures    full upper and lower    Patient Active Problem List   Diagnosis Date Noted  . Hyponatremia 07/21/2018  . Closed left hip fracture ([place]) 06/01/2017  . Noninfectious diarrhea   . Anemia 12/18/2016  . COPD (chronic obstructive pulmonary disease) ([place]) 12/18/2016  . Decreased hearing 12/18/2016  . Depression 12/18/2016  . GERD (gastroesophageal reflux disease) 12/18/2016  . GI bleed 12/18/2016  . Heart attack ([place]) 12/18/2016  . Hypertension 12/18/2016  . Knee osteoarthritis 12/18/2016  . Osteoporosis 12/18/2016  . Seasonal allergies 12/18/2016  . Skin cancer 12/18/2016  . AKI (acute kidney injury) ([place]) 11/17/2016  . Chronic  diarrhea   . Loss of weight   . Intractable cyclical vomiting with nausea   . Hyperlipidemia, mixed 10/04/2014    Past Surgical History:  Procedure Laterality Date  . ABDOMINAL HYSTERECTOMY    . APPENDECTOMY    . CATARACT EXTRACTION W/PHACO Left 08/06/2016   Procedure: CATARACT EXTRACTION PHACO AND INTRAOCULAR LENS PLACEMENT (IOC);  Surgeon: Leandrew Koyanagi, MD;  Location: [place];  Service: Ophthalmology;  Laterality: Left;  . CATARACT EXTRACTION W/PHACO Right 09/10/2016   Procedure: CATARACT EXTRACTION PHACO AND INTRAOCULAR LENS PLACEMENT (IOC);  Surgeon: Leandrew Koyanagi, MD;  Location: [place];  Service: Ophthalmology;  Laterality: Right;  . CHOLECYSTECTOMY    . COLONOSCOPY    . COLONOSCOPY WITH PROPOFOL N/A 12/22/2016   Procedure: COLONOSCOPY WITH PROPOFOL;  Surgeon: Lucilla Lame, MD;  Location: [place];  Service: Endoscopy;  Laterality: N/A;  . ESOPHAGOGASTRODUODENOSCOPY    . ESOPHAGOGASTRODUODENOSCOPY (EGD) WITH PROPOFOL N/A 07/21/2016   Procedure: ESOPHAGOGASTRODUODENOSCOPY (EGD) WITH PROPOFOL;  Surgeon: Lollie Sails, MD;  Location[facility] ENDOSCOPY;  Service: Endoscopy;  Laterality: N/A;  . GANGLION CYST EXCISION Left    wrist (x2)  . INTRAMEDULLARY (IM) NAIL INTERTROCHANTERIC Left 06/01/2017   Procedure: INTRAMEDULLARY (IM) NAIL INTERTROCHANTRIC;  Surgeon: Hessie Knows, MD;  Location: [facility];  Service: Orthopedics;  Laterality: Left;  . PILONIDAL CYST EXCISION      Prior to Admission medications  Medication Sig Start Date End Date Taking? Authorizing Provider  albuterol (PROVENTIL HFA;VENTOLIN HFA) 108 (90 Base) MCG/ACT inhaler Inhale 2 puffs into the lungs every 6 (six) hours as needed for wheezing or shortness of breath.    [provider]  atorvastatin (LIPITOR) 20 MG tablet Take 20 mg by mouth daily.    [provider]  cephALEXin (KEFLEX) 500 MG capsule Take 1 capsule (500 mg total) by mouth 2 (two) times  daily. 08/28/18   Lavonia Drafts, MD  citalopram (CELEXA) 40 MG tablet Take 40 mg by mouth daily.    [provider]  enalapril (VASOTEC) 5 MG tablet Take 1 tablet (5 mg total) by mouth daily. 07/23/18 08/22/18  Gl[place]stone Lighter, MD  feeding supplement, ENSURE ENLIVE, (ENSURE ENLIVE) LIQD Take 237 mLs by mouth 3 (three) times daily with meals. 11/19/16   Bettey Costa, MD  Fluticasone-Salmeterol (ADVAIR) 250-50 MCG/DOSE AEPB Inhale 1 puff into the lungs every 12 (twelve) hours.    [provider]  furosemide (LASIX) 20 MG tablet Take 1 tablet (20 mg total) by mouth daily as needed for fluid (dyspnea). 07/23/18 07/23/19  [place] Lighter, MD  HYDROcodone-acetaminophen (NORCO/VICODIN) 5-325 MG tablet Take 1-2 tablets by mouth every 6 (six) hours as needed for moderate pain or severe pain. 07/23/18   [place] Lighter, MD  ipratropium-albuterol (DUONEB) 0.5-2.5 (3) MG/3ML SOLN Take 3 mLs by nebulization every 6 (six) hours as needed (wheezing, shortness of breath). 07/23/18   [place] Lighter, MD  nicotine (NICODERM CQ - DOSED IN MG/24 HOURS) 14 mg/24hr patch Place 1 patch (14 mg total) onto the skin daily. 07/24/18   Gl[place]stone Lighter, MD  omeprazole (PRILOSEC) 20 MG capsule Take 20 mg by mouth daily.    [provider]  tiotropium (SPIRIVA) 18 MCG inhalation capsule Place 18 mcg into inhaler and inhale daily.    [provider]  traZODone (DESYREL) 100 MG tablet Take 200 mg by mouth at bedtime.    [provider]     Allergies Penicillins  Family History  Problem Relation Age of Onset  . Alzheimer's disease Mother   . Lymphoma Father     Social History Social History   Tobacco Use  . Smoking status: Current Every Day Smoker    Packs/day: 0.50    Years: 60.00    Pack years: 30.00  . Smokeless tobacco: Never Used  Substance Use Topics  . Alcohol use: No  . Drug use: No    Review of Systems  Constitutional: No  fever/chills  Cardiovascular: Denies chest pain. Respiratory: Denies shortness of breath.  C Gastrointestinal: No abdominal pain.  No nausea, no vomiting.    Musculoskeletal: Left leg pain as above Skin: Redness to the left leg as above Neurological: Negative for headaches    ____________________________________________   PHYSICAL EXAM:  VITAL SIGNS: ED Triage Vitals  Enc Vitals Group     BP 08/28/18 1659 (!) 204/116     Pulse Rate 08/28/18 1659 81     Resp 08/28/18 2129 18     Temp 08/28/18 1659 98.7 F (37.1 C)     Temp Source 08/28/18 1659 Oral     SpO2 08/28/18 1659 98 %     Weight 08/28/18 1700 68 kg (149 lb 14.6 oz)     Height --      Head Circumference --      Peak Flow --      Pain Score 08/28/18 1700 0     Pain Loc --  Pain Edu? --      Excl. in [place]? --     Constitutional: Alert and oriented. No acute distress. Pleasant and interactive  Nose: No congestion/rhinnorhea. Mouth/Throat: Mucous membranes are moist.    Cardiovascular: Normal rate, regular rhythm. Grossly normal heart sounds.  Good peripheral circulation. Respiratory: Normal respiratory effort.  No retractions.  Gastrointestinal: Soft and nontender. No distention.  No CVA tenderness.  Musculoskeletal: No lower extremity tenderness nor edema.  Warm and well perfused Neurologic:  Normal speech and language. No gross focal neurologic deficits are appreciated.  Skin:  Skin is warm, dry and intact.  Psychiatric: Mood and affect are normal. Speech and behavior are normal.  ____________________________________________   LABS (all labs ordered are listed, but only abnormal results are displayed)  Labs Reviewed  CBC - Abnormal; Notable for the following components:      Result Value   RBC 3.75 (*)    Hemoglobin 10.9 (*)    HCT 35.1 (*)    All other components within normal limits  COMPREHENSIVE METABOLIC PANEL - Abnormal; Notable for the following components:   Total Protein 5.7 (*)     Albumin 3.3 (*)    GFR calc non Af Amer 52 (*)    GFR calc Af Amer 60 (*)    All other components within normal limits  APTT  PROTIME-INR   ____________________________________________  EKG  None ____________________________________________  RADIOLOGY  No DVT ____________________________________________   PROCEDURES  Procedure(s) performed: No  Procedures   Critical Care performed: No ____________________________________________   INITIAL IMPRESSION / ASSESSMENT AND PLAN / ED COURSE  Pertinent labs & imaging results that were available during my care of the patient were reviewed by me and considered in my medical decision making (see chart for details).  Patient presents with erythema along the anterior left lower leg somewhat suspicious for cellulitis.  No significant calf pain or swelling.  Ultrasound performed negative for DVT.  Given my concern about possible skin infection will cover with Keflex.  Lab work is overall reassuring    ____________________________________________   FINAL CLINICAL IMPRESSION(S) / ED DIAGNOSES  Final diagnoses:  Cellulitis of left lower extremity        Note:  This document was prepared using Dragon voice recognition software and may include unintentional dictation errors.    Lavonia Drafts, MD 08/28/18 [phone]

## 2018-08-28 NOTE — ED Notes (Signed)
Paperwork with pt to the [facility].

## 2018-11-01 ENCOUNTER — Ambulatory Visit (INDEPENDENT_AMBULATORY_CARE_PROVIDER_SITE_OTHER): Payer: Medicare Other | Admitting: Vascular Surgery

## 2018-11-01 ENCOUNTER — Encounter (INDEPENDENT_AMBULATORY_CARE_PROVIDER_SITE_OTHER): Payer: Self-pay | Admitting: Vascular Surgery

## 2018-11-01 VITALS — BP 161/72 | HR 63 | Resp 16 | Ht 65.0 in | Wt 168.0 lb

## 2018-11-01 DIAGNOSIS — I890 Lymphedema, not elsewhere classified: Secondary | ICD-10-CM | POA: Diagnosis not present

## 2018-11-01 DIAGNOSIS — I25118 Atherosclerotic heart disease of native coronary artery with other forms of angina pectoris: Secondary | ICD-10-CM | POA: Diagnosis not present

## 2018-11-01 DIAGNOSIS — I739 Peripheral vascular disease, unspecified: Secondary | ICD-10-CM | POA: Diagnosis not present

## 2018-11-01 DIAGNOSIS — I10 Essential (primary) hypertension: Secondary | ICD-10-CM

## 2018-11-01 DIAGNOSIS — J449 Chronic obstructive pulmonary disease, unspecified: Secondary | ICD-10-CM

## 2018-11-01 DIAGNOSIS — K219 Gastro-esophageal reflux disease without esophagitis: Secondary | ICD-10-CM

## 2018-11-01 NOTE — Progress Notes (Signed)
MRN : [id number]  Kari Mcdonald is a 85 y.o. ([birth year]) female who presents with chief complaint of left leg pain and swelling.  History of Present Illness:   Patient is seen for evaluation of leg pain and leg swelling left leg much more than the right. The patient first noticed the swelling remotely. The swelling is associated with pain and discoloration. The pain and swelling worsens with prolonged dependency and improves with elevation. The pain is unrelated to activity.  The patient notes that in the morning the legs are significantly improved but they steadily worsened throughout the course of the day. The patient also notes a steady worsening of the discoloration in the ankle and shin area.   The patient denies claudication symptoms.  The patient denies symptoms consistent with rest pain.  The patient denies and extensive history of DJD and LS spine disease.  The patient has no had any past angiography, interventions or vascular surgery.  Elevation makes the leg symptoms better, dependency makes them much worse. There is no history of ulcerations. The patient denies any recent changes in medications.  The patient has not been wearing graduated compression.  The patient denies a history of DVT or PE. There is no prior history of phlebitis. There is no history of primary lymphedema.  No history of malignancies. No history of trauma or groin or pelvic surgery. There is no history of radiation treatment to the groin or pelvis  The patient denies amaurosis fugax or recent TIA symptoms. There are no recent neurological changes noted. The patient denies recent episodes of angina or shortness of breath  No outpatient medications have been marked as taking for the 11/01/18 encounter (Appointment) with Delana Meyer, Dolores Lory, MD.    Past Medical History:  Diagnosis Date  . Anemia   . Arthritis    left knee  . Cancer ([place])    Skin Cancer  . COPD (chronic obstructive pulmonary  disease) ([place])   . Coronary artery disease   . Depression   . Diverticulosis   . Dysphagia   . Dyspnea   . GERD (gastroesophageal reflux disease)   . GI bleed   . Headache    from eye strain  . History of hiatal hernia   . HOH (hard of hearing)   . Hyperlipidemia   . Hypertension   . Myocardial infarction ([place])    2006  . Osteoporosis   . Wears dentures    full upper and lower    Past Surgical History:  Procedure Laterality Date  . ABDOMINAL HYSTERECTOMY    . APPENDECTOMY    . CATARACT EXTRACTION W/PHACO Left 08/06/2016   Procedure: CATARACT EXTRACTION PHACO AND INTRAOCULAR LENS PLACEMENT (IOC);  Surgeon: Leandrew Koyanagi, MD;  Location: [place];  Service: Ophthalmology;  Laterality: Left;  . CATARACT EXTRACTION W/PHACO Right 09/10/2016   Procedure: CATARACT EXTRACTION PHACO AND INTRAOCULAR LENS PLACEMENT (IOC);  Surgeon: Leandrew Koyanagi, MD;  Location: [place];  Service: Ophthalmology;  Laterality: Right;  . CHOLECYSTECTOMY    . COLONOSCOPY    . COLONOSCOPY WITH PROPOFOL N/A 12/22/2016   Procedure: COLONOSCOPY WITH PROPOFOL;  Surgeon: Lucilla Lame, MD;  Location: [place];  Service: Endoscopy;  Laterality: N/A;  . ESOPHAGOGASTRODUODENOSCOPY    . ESOPHAGOGASTRODUODENOSCOPY (EGD) WITH PROPOFOL N/A 07/21/2016   Procedure: ESOPHAGOGASTRODUODENOSCOPY (EGD) WITH PROPOFOL;  Surgeon: Lollie Sails, MD;  Location[facility] ENDOSCOPY;  Service: Endoscopy;  Laterality: N/A;  . GANGLION CYST EXCISION Left    wrist (  x2)  . INTRAMEDULLARY (IM) NAIL INTERTROCHANTERIC Left 06/01/2017   Procedure: INTRAMEDULLARY (IM) NAIL INTERTROCHANTRIC;  Surgeon: Hessie Knows, MD;  Location: [facility];  Service: Orthopedics;  Laterality: Left;  . PILONIDAL CYST EXCISION      Social History Social History   Tobacco Use  . Smoking status: Current Every Day Smoker    Packs/day: 0.50    Years: 60.00    Pack years: 30.00  . Smokeless tobacco: Never Used    Substance Use Topics  . Alcohol use: No  . Drug use: No    Family History Family History  Problem Relation Age of Onset  . Alzheimer's disease Mother   . Lymphoma Father   No family history of bleeding/clotting disorders, porphyria or autoimmune disease   Allergies  Allergen Reactions  . Penicillins Rash    Has patient had a PCN reaction causing immediate rash, facial/tongue/throat swelling, SOB or lightheadedness with hypotension: Unknown Has patient had a PCN reaction causing severe rash involving mucus membranes or skin necrosis: Unknown Has patient had a PCN reaction that required hospitalization: Unknown Has patient had a PCN reaction occurring within the last 10 years: Unknown If all of the above answers are "NO", then may proceed with Cephalosporin use.      REVIEW OF SYSTEMS (Negative unless checked)  Constitutional: [] Weight loss  [] Fever  [] Chills Cardiac: [] Chest pain   [] Chest pressure   [] Palpitations   [] Shortness of breath when laying flat   [] Shortness of breath with exertion. Vascular:  [x] Pain in legs with walking   [x] Pain in legs at rest  [] History of DVT   [] Phlebitis   [x] Swelling in legs   [] Varicose veins   [] Non-healing ulcers Pulmonary:   [] Uses home oxygen   [] Productive cough   [] Hemoptysis   [] Wheeze  [x] COPD   [] Asthma Neurologic:  [] Dizziness   [] Seizures   [] History of stroke   [] History of TIA  [] Aphasia   [] Vissual changes   [] Weakness or numbness in arm   [] Weakness or numbness in leg Musculoskeletal:   [] Joint swelling   [] Joint pain   [] Low back pain Hematologic:  [] Easy bruising  [] Easy bleeding   [] Hypercoagulable state   [] Anemic Gastrointestinal:  [] Diarrhea   [] Vomiting  [x] Gastroesophageal reflux/heartburn   [] Difficulty swallowing. Genitourinary:  [] Chronic kidney disease   [] Difficult urination  [] Frequent urination   [] Blood in urine Skin:  [x] Rashes   [] Ulcers  Psychological:  [] History of anxiety   []  History of major  depression.  Physical Examination  There were no vitals filed for this visit. There is no height or weight on file to calculate BMI. Gen: WD/WN, NAD Head: [place]/AT, No temporalis wasting.  Ear/Nose/Throat: Hearing grossly intact, nares w/o erythema or drainage, poor dentition Eyes: PER, EOMI, sclera nonicteric.  Neck: Supple, no masses.  No bruit or JVD.  Pulmonary:  Good air movement, clear to auscultation bilaterally, no use of accessory muscles.  Cardiac: RRR, normal S1, S2, no Murmurs. Vascular: scattered varicosities present bilaterally.  Severe left and moderate right venous stasis changes to the legs bilaterally.  3-4+ soft pitting edema Vessel Right Left  Radial Palpable Palpable  PT Not Palpable Not Palpable  DP Not Palpable Not Palpable  Gastrointestinal: soft, non-distended. No guarding/no peritoneal signs.  Musculoskeletal: M/S 5/5 throughout.  No deformity or atrophy.  Neurologic: CN 2-12 intact. Pain and light touch intact in extremities.  Symmetrical.  Speech is fluent. Motor exam as listed above. Psychiatric: Judgment intact, Mood & affect appropriate for pt's clinical  situation. Dermatologic: venous rashes no ulcers noted.  No changes consistent with cellulitis. Lymph : No Cervical lymphadenopathy, no lichenification or skin changes of chronic lymphedema.  CBC Lab Results  Component Value Date   WBC 5.6 08/28/2018   HGB 10.9 (L) 08/28/2018   HCT 35.1 (L) 08/28/2018   MCV 93.6 08/28/2018   PLT 181 08/28/2018    BMET    Component Value Date/Time   NA 139 08/28/2018 1746   NA 137 11/28/2013 0549   K 4.1 08/28/2018 1746   K 3.9 11/28/2013 0549   CL 105 08/28/2018 1746   CL 106 11/28/2013 0549   CO2 27 08/28/2018 1746   CO2 26 11/28/2013 0549   GLUCOSE 92 08/28/2018 1746   GLUCOSE 216 (H) 11/28/2013 0549   BUN 12 08/28/2018 1746   BUN 18 11/28/2013 0549   CREATININE 0.98 08/28/2018 1746   CREATININE 1.18 11/28/2013 0549   CALCIUM 9.1 08/28/2018 1746    CALCIUM 8.2 (L) 11/28/2013 0549   GFRNONAA 52 (L) 08/28/2018 1746   GFRNONAA 44 (L) 11/28/2013 0549   GFRAA 60 (L) 08/28/2018 1746   GFRAA 51 (L) 11/28/2013 0549   CrCl cannot be calculated (Patient's most recent lab result is older than the maximum 21 days allowed.).  COAG Lab Results  Component Value Date   INR 1.01 08/28/2018   INR 1.1 06/15/2012    Radiology No results found.  Assessment/Plan 1. Lymphedema I have had a long discussion with the patient regarding swelling and why it  causes symptoms.  Patient will begin wearing graduated compression stockings class 1 (20-30 mmHg) on a daily basis a prescription was given. The patient will  beginning wearing the stockings first thing in the morning and removing them in the evening. The patient is instructed specifically not to sleep in the stockings.   In addition, behavioral modification will be initiated.  This will include frequent elevation, use of over the counter pain medications and exercise such as walking.  I have reviewed systemic causes for chronic edema such as liver, kidney and cardiac etiologies.  The patient denies problems with these organ systems.    Consideration for a lymph pump will also be made based upon the effectiveness of conservative therapy.  This would help to improve the edema control and prevent sequela such as ulcers and infections   Patient should undergo duplex ultrasound of the venous system to ensure that DVT or reflux is not present.  The patient will follow-up with me after the ultrasound.   - VAS [place] LOWER EXTREMITY VENOUS REFLUX; Future  2. PAD (peripheral artery disease) (HCC)  Recommend:  The patient has evidence of atherosclerosis of the lower extremities with claudication.    Noninvasive studies will be ordered No invasive studies, angiography or surgery at this time The patient should continue walking and begin a more formal exercise program.  The patient should continue  antiplatelet therapy and aggressive treatment of the lipid abnormalities  No changes in the patient's medications at this time  The patient should continue wearing graduated compression socks 10-15 mmHg strength to control the mild edema.   - VAS [place] ABI WITH/WO TBI; Future  3. Coronary artery disease of native artery of native heart with stable angina pectoris (HCC) Continue cardiac and antihypertensive medications as already ordered and reviewed, no changes at this time.  Continue statin as ordered and reviewed, no changes at this time  Nitrates PRN for chest pain   4. Essential hypertension Continue antihypertensive medications  as already ordered, these medications have been reviewed and there are no changes at this time.   5. Chronic obstructive pulmonary disease, unspecified COPD type ([place]) Continue pulmonary medications and aerosols as already ordered, these medications have been reviewed and there are no changes at this time.    6. Gastroesophageal reflux disease without esophagitis Continue PPI as already ordered, this medication has been reviewed and there are no changes at this time.  Avoidence of caffeine and alcohol  Moderate elevation of the head of the bed     Hortencia Pilar, MD  11/01/2018 10:36 AM

## 2018-12-02 ENCOUNTER — Ambulatory Visit (INDEPENDENT_AMBULATORY_CARE_PROVIDER_SITE_OTHER): Payer: Medicare Other | Admitting: Nurse Practitioner

## 2018-12-02 ENCOUNTER — Encounter (INDEPENDENT_AMBULATORY_CARE_PROVIDER_SITE_OTHER): Payer: Self-pay | Admitting: Nurse Practitioner

## 2018-12-02 ENCOUNTER — Ambulatory Visit (INDEPENDENT_AMBULATORY_CARE_PROVIDER_SITE_OTHER): Payer: Medicare Other

## 2018-12-02 VITALS — BP 173/60 | HR 70 | Resp 16 | Ht 65.0 in | Wt 168.0 lb

## 2018-12-02 DIAGNOSIS — I890 Lymphedema, not elsewhere classified: Secondary | ICD-10-CM | POA: Diagnosis not present

## 2018-12-02 DIAGNOSIS — M170 Bilateral primary osteoarthritis of knee: Secondary | ICD-10-CM

## 2018-12-02 DIAGNOSIS — J449 Chronic obstructive pulmonary disease, unspecified: Secondary | ICD-10-CM

## 2018-12-02 DIAGNOSIS — K219 Gastro-esophageal reflux disease without esophagitis: Secondary | ICD-10-CM | POA: Diagnosis not present

## 2018-12-02 DIAGNOSIS — I739 Peripheral vascular disease, unspecified: Secondary | ICD-10-CM | POA: Diagnosis not present

## 2018-12-02 DIAGNOSIS — Z7902 Long term (current) use of antithrombotics/antiplatelets: Secondary | ICD-10-CM

## 2018-12-02 DIAGNOSIS — Z79899 Other long term (current) drug therapy: Secondary | ICD-10-CM

## 2018-12-02 DIAGNOSIS — F17200 Nicotine dependence, unspecified, uncomplicated: Secondary | ICD-10-CM

## 2018-12-02 MED ORDER — DICLOFENAC SODIUM 1 % TD GEL: 4.0000 g | Freq: Four times a day (QID) | TRANSDERMAL | 0 refills | Status: DC

## 2018-12-16 ENCOUNTER — Encounter (INDEPENDENT_AMBULATORY_CARE_PROVIDER_SITE_OTHER): Payer: Self-pay | Admitting: Nurse Practitioner

## 2018-12-16 NOTE — Progress Notes (Signed)
SUBJECTIVE:  Patient ID: Kari Mcdonald, female    DOB: Apr 14, 1932, 85 y.o.   MRN: [id number] Chief Complaint  Patient presents with  . Follow-up    pt conv ultrasound follow up    HPI  Kari Mcdonald is a 85 y.o. female that is following up with concerns for lower extremity pain.  The patient describes the pain as constant from her knee down.  She states that it is worse when she is sitting any gets better as she gets up and ambulates somewhat.  However she ambulates for extended periods of time it becomes painful.  She states that she has taken ibuprofen as well as Tylenol for the pain and it is helped somewhat, however it does not last as long as she would like.  When the pain gets too intense she takes 1 of her Norco.  Patient denies any fever, chills, nausea, vomiting or diarrhea.  She denies any chest pain or shortness of breath.  She denies any TIA or amaurosis ejects-like symptoms.  The patient denies having any prior vascular interventions.  An outside facility the patient underwent ABIs which revealed ABIs of 0.95 bilaterally.  ABIs done today are consistent with those.  Right ABI is 1.04 with her left at 0.94.  She has triphasic tibial artery waveforms bilaterally.  She also has strong toe waveforms on her right great digit however dampened on her left.  The patient also underwent a lower extremity venous reflux study which reveals no evidence of DVT of the left lower extremity.  There is no evidence of chronic venous insufficiency as well as no evidence of superficial venous thrombosis.  It is also worth noting that the test was limited due to poor patient positioning so the left popliteal vein as well as small saphenous vein were not visualized.  Past Medical History:  Diagnosis Date  . Anemia   . Arthritis    left knee  . Cancer ([place])    Skin Cancer  . COPD (chronic obstructive pulmonary disease) ([place])   . Coronary artery disease   . Depression   .  Diverticulosis   . Dysphagia   . Dyspnea   . GERD (gastroesophageal reflux disease)   . GI bleed   . Headache    from eye strain  . History of hiatal hernia   . HOH (hard of hearing)   . Hyperlipidemia   . Hypertension   . Myocardial infarction ([place])    2006  . Osteoporosis   . Wears dentures    full upper and lower    Past Surgical History:  Procedure Laterality Date  . ABDOMINAL HYSTERECTOMY    . APPENDECTOMY    . CATARACT EXTRACTION W/PHACO Left 08/06/2016   Procedure: CATARACT EXTRACTION PHACO AND INTRAOCULAR LENS PLACEMENT (IOC);  Surgeon: Leandrew Koyanagi, MD;  Location: [facility];  Service: Ophthalmology;  Laterality: Left;  . CATARACT EXTRACTION W/PHACO Right 09/10/2016   Procedure: CATARACT EXTRACTION PHACO AND INTRAOCULAR LENS PLACEMENT (IOC);  Surgeon: Leandrew Koyanagi, MD;  Location: [facility];  Service: Ophthalmology;  Laterality: Right;  . CHOLECYSTECTOMY    . COLONOSCOPY    . COLONOSCOPY WITH PROPOFOL N/A 12/22/2016   Procedure: COLONOSCOPY WITH PROPOFOL;  Surgeon: Lucilla Lame, MD;  Location: [facility];  Service: Endoscopy;  Laterality: N/A;  . ESOPHAGOGASTRODUODENOSCOPY    . ESOPHAGOGASTRODUODENOSCOPY (EGD) WITH PROPOFOL N/A 07/21/2016   Procedure: ESOPHAGOGASTRODUODENOSCOPY (EGD) WITH PROPOFOL;  Surgeon: Lollie Sails, MD;  Location: [facility];  Service: Endoscopy;  Laterality: N/A;  . GANGLION CYST EXCISION Left    wrist (x2)  . INTRAMEDULLARY (IM) NAIL INTERTROCHANTERIC Left 06/01/2017   Procedure: INTRAMEDULLARY (IM) NAIL INTERTROCHANTRIC;  Surgeon: Hessie Knows, MD;  Location: [facility];  Service: Orthopedics;  Laterality: Left;  . PILONIDAL CYST EXCISION      Social History   Socioeconomic History  . Marital status: Widowed    Spouse name: Not on file  . Number of children: Not on file  . Years of education: Not on file  . Highest education level: Not on file  Occupational History  . Not on file  Social  Needs  . Financial resource strain: Not on file  . Food insecurity:    Worry: Not on file    Inability: Not on file  . Transportation needs:    Medical: Not on file    Non-medical: Not on file  Tobacco Use  . Smoking status: Current Every Day Smoker    Packs/day: 0.50    Years: 60.00    Pack years: 30.00  . Smokeless tobacco: Never Used  Substance and Sexual Activity  . Alcohol use: No  . Drug use: No  . Sexual activity: Not on file  Lifestyle  . Physical activity:    Days per week: Not on file    Minutes per session: Not on file  . Stress: Not on file  Relationships  . Social connections:    Talks on phone: Not on file    Gets together: Not on file    Attends religious service: Not on file    Active member of club or organization: Not on file    Attends meetings of clubs or organizations: Not on file    Relationship status: Not on file  . Intimate partner violence:    Fear of current or ex partner: Not on file    Emotionally abused: Not on file    Physically abused: Not on file    Forced sexual activity: Not on file  Other Topics Concern  . Not on file  Social History Narrative  . Not on file    Family History  Problem Relation Age of Onset  . Alzheimer's disease Mother   . Lymphoma Father     Allergies  Allergen Reactions  . Penicillins Rash    Has patient had a PCN reaction causing immediate rash, facial/tongue/throat swelling, SOB or lightheadedness with hypotension: Unknown Has patient had a PCN reaction causing severe rash involving mucus membranes or skin necrosis: Unknown Has patient had a PCN reaction that required hospitalization: Unknown Has patient had a PCN reaction occurring within the last 10 years: Unknown If all of the above answers are "NO", then may proceed with Cephalosporin use.      Review of Systems   Review of Systems: Negative Unless Checked Constitutional: [] Weight loss  [] Fever  [] Chills Cardiac: [] Chest pain   []  Atrial  Fibrillation  [] Palpitations   [] Shortness of breath when laying flat   [] Shortness of breath with exertion. [] Shortness of breath at rest Vascular:  [x] Pain in legs with walking   [x] Pain in legs with standing [] Pain in legs when laying flat   [] Claudication    [] Pain in feet when laying flat    [] History of DVT   [] Phlebitis   [] Swelling in legs   [] Varicose veins   [] Non-healing ulcers Pulmonary:   [] Uses home oxygen   [] Productive cough   [] Hemoptysis   [] Wheeze  [x] COPD   [] Asthma Neurologic:  []   Dizziness   [] Seizures  [] Blackouts [] History of stroke   [] History of TIA  [] Aphasia   [] Temporary Blindness   [] Weakness or numbness in arm   [] Weakness or numbness in leg Musculoskeletal:   [x] Joint swelling   [x] Joint pain   [] Low back pain  []  History of Knee Replacement [] Arthritis [] back Surgeries  []  Spinal Stenosis    Hematologic:  [] Easy bruising  [] Easy bleeding   [] Hypercoagulable state   [] Anemic Gastrointestinal:  [] Diarrhea   [] Vomiting  [] Gastroesophageal reflux/heartburn   [] Difficulty swallowing. [] Abdominal pain Genitourinary:  [] Chronic kidney disease   [] Difficult urination  [] Anuric   [] Blood in urine [] Frequent urination  [] Burning with urination   [] Hematuria Skin:  [] Rashes   [] Ulcers [] Wounds Psychological:  [] History of anxiety   []  History of major depression  []  Memory Difficulties      OBJECTIVE:   Physical Exam  BP (!) 173/60 (BP Location: Left Arm)   Pulse 70   Resp 16   Ht 5\' 5"  (1.651 m)   Wt 168 lb (76.2 kg)   BMI 27.96 kg/m   Gen: WD/WN, NAD Head: [place]/AT, No temporalis wasting.  Ear/Nose/Throat: Hearing grossly intact, nares w/o erythema or drainage Eyes: PER, EOMI, sclera nonicteric.  Neck: Supple, no masses.  No JVD.  Pulmonary:  Good air movement, no use of accessory muscles.  Cardiac: RRR Vascular:  Minimal swelling present Vessel Right Left  Radial Palpable Palpable  Dorsalis Pedis Palpable Palpable  Posterior Tibial Palpable Palpable    Gastrointestinal: soft, non-distended. No guarding/no peritoneal signs.  Musculoskeletal: Patient ambulates with walker. No deformity or atrophy.  Swollen knees Neurologic: Pain and light touch intact in extremities.  Symmetrical.  Speech is fluent. Motor exam as listed above. Psychiatric: Judgment intact, Mood & affect appropriate for pt's clinical situation. Dermatologic: No Venous rashes. No Ulcers Noted.  No changes consistent with cellulitis. Lymph : No Cervical lymphadenopathy, no lichenification or skin changes of chronic lymphedema.       ASSESSMENT AND PLAN:  1. PAD (peripheral artery disease) (HCC) Recommend:  I do not find evidence of life style limiting vascular disease. The patient specifically denies life style limitation.  Previous noninvasive studies including ABI's of the legs do not identify critical vascular problems.  The patient should continue walking and begin a more formal exercise program. The patient should continue his antiplatelet therapy and aggressive treatment of the lipid abnormalities.  The patient should begin wearing graduated compression socks 15-20 mmHg strength to control her mild edema.  Patient will follow-up with me on a PRN basis   2. Chronic obstructive pulmonary disease, unspecified COPD type ([place]) Continue pulmonary medications and aerosols as already ordered, these medications have been reviewed and there are no changes at this time.    3. Gastroesophageal reflux disease without esophagitis Continue PPI as already ordered, this medication has been reviewed and there are no changes at this time.  Avoidence of caffeine and alcohol  Moderate elevation of the head of the bed   4. Primary osteoarthritis of both knees Currently managed by her primary care physician.  However, I feel as if her pain given all of the vascular studies is likely due to osteoarthritis.  Advised patient to try to utilize some Voltaren gel to see if this is  useful for management of her pain.  We will defer further management to primary care. - diclofenac sodium (VOLTAREN) 1 % GEL; Apply 4 g topically 4 (four) times daily.  Dispense: 1 Tube; Refill: 0  Current Outpatient Medications on File Prior to Visit  Medication Sig Dispense Refill  . albuterol (PROVENTIL HFA;VENTOLIN HFA) 108 (90 Base) MCG/ACT inhaler Inhale 2 puffs into the lungs every 6 (six) hours as needed for wheezing or shortness of breath.    Marland Kitchen atorvastatin (LIPITOR) 20 MG tablet Take 20 mg by mouth daily.    . cephALEXin (KEFLEX) 500 MG capsule Take 1 capsule (500 mg total) by mouth 2 (two) times daily. 14 capsule 0  . citalopram (CELEXA) 40 MG tablet Take 40 mg by mouth daily.    . feeding supplement, ENSURE ENLIVE, (ENSURE ENLIVE) LIQD Take 237 mLs by mouth 3 (three) times daily with meals. 237 mL 12  . Fluticasone-Salmeterol (ADVAIR) 250-50 MCG/DOSE AEPB Inhale 1 puff into the lungs every 12 (twelve) hours.    . furosemide (LASIX) 20 MG tablet Take 1 tablet (20 mg total) by mouth daily as needed for fluid (dyspnea). 30 tablet 0  . HYDROcodone-acetaminophen (NORCO/VICODIN) 5-325 MG tablet Take 1-2 tablets by mouth every 6 (six) hours as needed for moderate pain or severe pain. 30 tablet 0  . ipratropium-albuterol (DUONEB) 0.5-2.5 (3) MG/3ML SOLN Take 3 mLs by nebulization every 6 (six) hours as needed (wheezing, shortness of breath). 360 mL 0  . nicotine (NICODERM CQ - DOSED IN MG/24 HOURS) 14 mg/24hr patch Place 1 patch (14 mg total) onto the skin daily. 28 patch 0  . omeprazole (PRILOSEC) 20 MG capsule Take 20 mg by mouth daily.    Marland Kitchen tiotropium (SPIRIVA) 18 MCG inhalation capsule Place 18 mcg into inhaler and inhale daily.    . traZODone (DESYREL) 100 MG tablet Take 200 mg by mouth at bedtime.    . enalapril (VASOTEC) 5 MG tablet Take 1 tablet (5 mg total) by mouth daily. 30 tablet 0   No current facility-administered medications on file prior to visit.     There are no  Patient Instructions on file for this visit. No follow-ups on file.   Kris Hartmann, NP  This note was completed with Sales executive.  Any errors are purely unintentional.

## 2018-12-28 ENCOUNTER — Encounter (INDEPENDENT_AMBULATORY_CARE_PROVIDER_SITE_OTHER): Payer: Self-pay

## 2021-04-28 ENCOUNTER — Emergency Department: Payer: Medicare Other

## 2021-04-28 ENCOUNTER — Inpatient Hospital Stay
Admission: EM | Admit: 2021-04-28 | Discharge: 2021-05-01 | DRG: 193 | Disposition: A | Discharge status: Skilled Nursing Facility | Payer: Medicare Other | Source: Skilled Nursing Facility | Attending: Internal Medicine | Admitting: Internal Medicine

## 2021-04-28 ENCOUNTER — Encounter: Payer: Self-pay | Admitting: Emergency Medicine

## 2021-04-28 ENCOUNTER — Other Ambulatory Visit: Payer: Self-pay

## 2021-04-28 DIAGNOSIS — I252 Old myocardial infarction: Secondary | ICD-10-CM

## 2021-04-28 DIAGNOSIS — Z9841 Cataract extraction status, right eye: Secondary | ICD-10-CM

## 2021-04-28 DIAGNOSIS — K219 Gastro-esophageal reflux disease without esophagitis: Secondary | ICD-10-CM | POA: Diagnosis present

## 2021-04-28 DIAGNOSIS — I4891 Unspecified atrial fibrillation: Secondary | ICD-10-CM | POA: Diagnosis present

## 2021-04-28 DIAGNOSIS — I5032 Chronic diastolic (congestive) heart failure: Secondary | ICD-10-CM | POA: Diagnosis present

## 2021-04-28 DIAGNOSIS — G4700 Insomnia, unspecified: Secondary | ICD-10-CM | POA: Diagnosis present

## 2021-04-28 DIAGNOSIS — E43 Unspecified severe protein-calorie malnutrition: Secondary | ICD-10-CM | POA: Insufficient documentation

## 2021-04-28 DIAGNOSIS — Z961 Presence of intraocular lens: Secondary | ICD-10-CM | POA: Diagnosis present

## 2021-04-28 DIAGNOSIS — N390 Urinary tract infection, site not specified: Secondary | ICD-10-CM | POA: Diagnosis present

## 2021-04-28 DIAGNOSIS — Z681 Body mass index (BMI) 19 or less, adult: Secondary | ICD-10-CM

## 2021-04-28 DIAGNOSIS — Y92129 Unspecified place in nursing home as the place of occurrence of the external cause: Secondary | ICD-10-CM

## 2021-04-28 DIAGNOSIS — F17200 Nicotine dependence, unspecified, uncomplicated: Secondary | ICD-10-CM

## 2021-04-28 DIAGNOSIS — Z20822 Contact with and (suspected) exposure to covid-19: Secondary | ICD-10-CM | POA: Diagnosis present

## 2021-04-28 DIAGNOSIS — F32A Depression, unspecified: Secondary | ICD-10-CM | POA: Diagnosis present

## 2021-04-28 DIAGNOSIS — J440 Chronic obstructive pulmonary disease with acute lower respiratory infection: Secondary | ICD-10-CM | POA: Diagnosis present

## 2021-04-28 DIAGNOSIS — J441 Chronic obstructive pulmonary disease with (acute) exacerbation: Secondary | ICD-10-CM | POA: Diagnosis present

## 2021-04-28 DIAGNOSIS — F17210 Nicotine dependence, cigarettes, uncomplicated: Secondary | ICD-10-CM | POA: Diagnosis present

## 2021-04-28 DIAGNOSIS — Z993 Dependence on wheelchair: Secondary | ICD-10-CM | POA: Diagnosis not present

## 2021-04-28 DIAGNOSIS — E785 Hyperlipidemia, unspecified: Secondary | ICD-10-CM | POA: Diagnosis present

## 2021-04-28 DIAGNOSIS — I110 Hypertensive heart disease with heart failure: Secondary | ICD-10-CM | POA: Diagnosis present

## 2021-04-28 DIAGNOSIS — J189 Pneumonia, unspecified organism: Secondary | ICD-10-CM | POA: Diagnosis not present

## 2021-04-28 DIAGNOSIS — R296 Repeated falls: Secondary | ICD-10-CM | POA: Diagnosis present

## 2021-04-28 DIAGNOSIS — N3000 Acute cystitis without hematuria: Secondary | ICD-10-CM

## 2021-04-28 DIAGNOSIS — M810 Age-related osteoporosis without current pathological fracture: Secondary | ICD-10-CM | POA: Diagnosis present

## 2021-04-28 DIAGNOSIS — Z807 Family history of other malignant neoplasms of lymphoid, hematopoietic and related tissues: Secondary | ICD-10-CM

## 2021-04-28 DIAGNOSIS — B9620 Unspecified Escherichia coli [E. coli] as the cause of diseases classified elsewhere: Secondary | ICD-10-CM | POA: Diagnosis present

## 2021-04-28 DIAGNOSIS — Z85828 Personal history of other malignant neoplasm of skin: Secondary | ICD-10-CM

## 2021-04-28 DIAGNOSIS — Z820 Family history of epilepsy and other diseases of the nervous system: Secondary | ICD-10-CM

## 2021-04-28 DIAGNOSIS — M25462 Effusion, left knee: Secondary | ICD-10-CM | POA: Diagnosis present

## 2021-04-28 DIAGNOSIS — Z880 Allergy status to penicillin: Secondary | ICD-10-CM

## 2021-04-28 DIAGNOSIS — J159 Unspecified bacterial pneumonia: Principal | ICD-10-CM | POA: Diagnosis present

## 2021-04-28 DIAGNOSIS — G9341 Metabolic encephalopathy: Secondary | ICD-10-CM | POA: Diagnosis present

## 2021-04-28 DIAGNOSIS — R269 Unspecified abnormalities of gait and mobility: Secondary | ICD-10-CM | POA: Diagnosis present

## 2021-04-28 DIAGNOSIS — I2510 Atherosclerotic heart disease of native coronary artery without angina pectoris: Secondary | ICD-10-CM | POA: Diagnosis present

## 2021-04-28 DIAGNOSIS — W1830XA Fall on same level, unspecified, initial encounter: Secondary | ICD-10-CM | POA: Diagnosis present

## 2021-04-28 DIAGNOSIS — J9621 Acute and chronic respiratory failure with hypoxia: Secondary | ICD-10-CM | POA: Diagnosis present

## 2021-04-28 DIAGNOSIS — Z79899 Other long term (current) drug therapy: Secondary | ICD-10-CM

## 2021-04-28 DIAGNOSIS — Z9842 Cataract extraction status, left eye: Secondary | ICD-10-CM

## 2021-04-28 LAB — RESP PANEL BY RT-PCR (FLU A&B, COVID) ARPGX2
Influenza A by PCR: NEGATIVE
Influenza B by PCR: NEGATIVE
SARS Coronavirus 2 by RT PCR: NEGATIVE

## 2021-04-28 LAB — CBC
HCT: 35.6 % — ABNORMAL LOW (ref 36.0–46.0)
Hemoglobin: 11.6 g/dL — ABNORMAL LOW (ref 12.0–15.0)
MCH: 31.9 pg (ref 26.0–34.0)
MCHC: 32.6 g/dL (ref 30.0–36.0)
MCV: 97.8 fL (ref 80.0–100.0)
Platelets: 290 10*3/uL (ref 150–400)
RBC: 3.64 MIL/uL — ABNORMAL LOW (ref 3.87–5.11)
RDW: 13.8 % (ref 11.5–15.5)
WBC: 10.3 10*3/uL (ref 4.0–10.5)
nRBC: 0 % (ref 0.0–0.2)

## 2021-04-28 LAB — TROPONIN I (HIGH SENSITIVITY)
Troponin I (High Sensitivity): 20 ng/L — ABNORMAL HIGH (ref <18)
Troponin I (High Sensitivity): 24 ng/L — ABNORMAL HIGH (ref <18)

## 2021-04-28 LAB — BASIC METABOLIC PANEL
Anion gap: 5 (ref 5–15)
BUN: 19 mg/dL (ref 8–23)
CO2: 27 mmol/L (ref 22–32)
Calcium: 9.4 mg/dL (ref 8.9–10.3)
Chloride: 108 mmol/L (ref 98–111)
Creatinine, Ser: 0.84 mg/dL (ref 0.44–1.00)
GFR, Estimated: >60 mL/min (ref >60)
Glucose, Bld: 101 mg/dL — ABNORMAL HIGH (ref 70–99)
Potassium: 4.3 mmol/L (ref 3.5–5.1)
Sodium: 140 mmol/L (ref 135–145)

## 2021-04-28 LAB — URINALYSIS, COMPLETE (UACMP) WITH MICROSCOPIC
Bilirubin Urine: NEGATIVE
Glucose, UA: NEGATIVE mg/dL
Ketones, ur: NEGATIVE mg/dL
Nitrite: POSITIVE — AB
Protein, ur: NEGATIVE mg/dL
Specific Gravity, Urine: 1.013 (ref 1.005–1.030)
pH: 6 (ref 5.0–8.0)

## 2021-04-28 LAB — LACTIC ACID, PLASMA: Lactic Acid, Venous: 1.8 mmol/L (ref 0.5–1.9)

## 2021-04-28 MED ORDER — FLUTICASONE PROPIONATE 50 MCG/ACT NA SUSP
2.0000 | Freq: Every day | NASAL | Status: DC
  Administered 2021-04-29 – 2021-05-01 (×3): 2 via NASAL
  Filled 2021-04-28: qty 16

## 2021-04-28 MED ORDER — GUAIFENESIN ER 600 MG PO TB12
600.0000 mg | ORAL_TABLET | Freq: Two times a day (BID) | ORAL | Status: DC
  Administered 2021-04-28 – 2021-05-01 (×6): 600 mg via ORAL
  Filled 2021-04-28 (×6): qty 1

## 2021-04-28 MED ORDER — AMLODIPINE BESYLATE 10 MG PO TABS
10.0000 mg | ORAL_TABLET | Freq: Every day | ORAL | Status: DC
  Administered 2021-04-29 – 2021-05-01 (×3): 10 mg via ORAL
  Filled 2021-04-28 (×3): qty 1

## 2021-04-28 MED ORDER — HYDRALAZINE HCL 20 MG/ML IJ SOLN: 10.0000 mg | INTRAMUSCULAR | Status: DC | PRN

## 2021-04-28 MED ORDER — CITALOPRAM HYDROBROMIDE 20 MG PO TABS
20.0000 mg | ORAL_TABLET | Freq: Every day | ORAL | Status: DC
  Administered 2021-04-29 – 2021-05-01 (×3): 20 mg via ORAL
  Filled 2021-04-28 (×3): qty 1

## 2021-04-28 MED ORDER — PANTOPRAZOLE SODIUM 40 MG PO TBEC
40.0000 mg | DELAYED_RELEASE_TABLET | Freq: Every day | ORAL | Status: DC
  Administered 2021-04-29 – 2021-05-01 (×3): 40 mg via ORAL
  Filled 2021-04-28 (×3): qty 1

## 2021-04-28 MED ORDER — TIOTROPIUM BROMIDE MONOHYDRATE 18 MCG IN CAPS
18.0000 ug | ORAL_CAPSULE | Freq: Every day | RESPIRATORY_TRACT | Status: DC
  Administered 2021-04-29 – 2021-05-01 (×3): 18 ug via RESPIRATORY_TRACT
  Filled 2021-04-28: qty 5

## 2021-04-28 MED ORDER — FLEET ENEMA 7-19 GM/118ML RE ENEM: 1.0000 | ENEMA | Freq: Once | RECTAL | Status: DC | PRN

## 2021-04-28 MED ORDER — VITAMIN D 25 MCG (1000 UNIT) PO TABS
1000.0000 [IU] | ORAL_TABLET | Freq: Every day | ORAL | Status: DC
  Administered 2021-04-29 – 2021-05-01 (×3): 1000 [IU] via ORAL
  Filled 2021-04-28 (×3): qty 1

## 2021-04-28 MED ORDER — HYDROCODONE-ACETAMINOPHEN 5-325 MG PO TABS
1.0000 | ORAL_TABLET | ORAL | Status: DC | PRN
  Administered 2021-04-28 – 2021-04-30 (×2): 1 via ORAL
  Filled 2021-04-28 (×2): qty 1

## 2021-04-28 MED ORDER — NICOTINE 21 MG/24HR TD PT24
21.0000 mg | MEDICATED_PATCH | Freq: Every day | TRANSDERMAL | Status: DC
  Administered 2021-04-28 – 2021-05-01 (×4): 21 mg via TRANSDERMAL
  Filled 2021-04-28 (×4): qty 1

## 2021-04-28 MED ORDER — DIVALPROEX SODIUM 125 MG PO DR TAB
125.0000 mg | DELAYED_RELEASE_TABLET | ORAL | Status: DC
  Filled 2021-04-28 (×2): qty 1

## 2021-04-28 MED ORDER — ONDANSETRON HCL 4 MG/2ML IJ SOLN: 4.0000 mg | Freq: Four times a day (QID) | INTRAMUSCULAR | Status: DC | PRN

## 2021-04-28 MED ORDER — IPRATROPIUM-ALBUTEROL 0.5-2.5 (3) MG/3ML IN SOLN: 3.0000 mL | RESPIRATORY_TRACT | Status: DC | PRN

## 2021-04-28 MED ORDER — FLUTICASONE PROPIONATE 50 MCG/ACT NA SUSP: 2.0000 | Freq: Every day | NASAL | Status: DC

## 2021-04-28 MED ORDER — TIOTROPIUM BROMIDE MONOHYDRATE 18 MCG IN CAPS: 18.0000 ug | ORAL_CAPSULE | Freq: Every day | RESPIRATORY_TRACT | Status: DC

## 2021-04-28 MED ORDER — SENNOSIDES-DOCUSATE SODIUM 8.6-50 MG PO TABS
1.0000 | ORAL_TABLET | Freq: Two times a day (BID) | ORAL | Status: DC
  Administered 2021-04-29 – 2021-05-01 (×5): 1 via ORAL
  Filled 2021-04-28 (×6): qty 1

## 2021-04-28 MED ORDER — SODIUM CHLORIDE 0.9 % IV SOLN
500.0000 mg | INTRAVENOUS | Status: DC
  Administered 2021-04-28 – 2021-04-30 (×3): 500 mg via INTRAVENOUS
  Filled 2021-04-28 (×4): qty 500

## 2021-04-28 MED ORDER — DIVALPROEX SODIUM 125 MG PO DR TAB
125.0000 mg | DELAYED_RELEASE_TABLET | ORAL | Status: DC
  Administered 2021-04-29 – 2021-05-01 (×6): 125 mg via ORAL
  Filled 2021-04-28 (×7): qty 1

## 2021-04-28 MED ORDER — MELATONIN 5 MG PO TABS
10.0000 mg | ORAL_TABLET | Freq: Every day | ORAL | Status: DC
  Administered 2021-04-28 – 2021-04-30 (×3): 10 mg via ORAL
  Filled 2021-04-28 (×3): qty 2

## 2021-04-28 MED ORDER — IPRATROPIUM-ALBUTEROL 0.5-2.5 (3) MG/3ML IN SOLN
3.0000 mL | Freq: Four times a day (QID) | RESPIRATORY_TRACT | Status: DC
  Administered 2021-04-28 – 2021-05-01 (×11): 3 mL via RESPIRATORY_TRACT
  Filled 2021-04-28 (×11): qty 3

## 2021-04-28 MED ORDER — CEFTRIAXONE SODIUM 1 G IJ SOLR
1.0000 g | INTRAMUSCULAR | Status: DC
  Administered 2021-04-30: 1 g via INTRAVENOUS
  Filled 2021-04-28 (×2): qty 10

## 2021-04-28 MED ORDER — FLUTICASONE FUROATE-VILANTEROL 200-25 MCG/INH IN AEPB: 1.0000 | INHALATION_SPRAY | Freq: Every day | RESPIRATORY_TRACT | Status: DC

## 2021-04-28 MED ORDER — ONDANSETRON HCL 4 MG PO TABS: 4.0000 mg | ORAL_TABLET | Freq: Four times a day (QID) | ORAL | Status: DC | PRN

## 2021-04-28 MED ORDER — MORPHINE SULFATE (PF) 2 MG/ML IV SOLN: 2.0000 mg | INTRAVENOUS | Status: DC | PRN

## 2021-04-28 MED ORDER — DICLOFENAC SODIUM 1 % EX GEL
4.0000 g | Freq: Three times a day (TID) | CUTANEOUS | Status: DC
  Administered 2021-04-28 – 2021-05-01 (×6): 4 g via TOPICAL
  Filled 2021-04-28: qty 100

## 2021-04-28 MED ORDER — ACETAMINOPHEN 325 MG PO TABS
650.0000 mg | ORAL_TABLET | Freq: Four times a day (QID) | ORAL | Status: DC | PRN
  Administered 2021-04-29: 650 mg via ORAL
  Filled 2021-04-28 (×2): qty 2

## 2021-04-28 MED ORDER — BACLOFEN 10 MG PO TABS
5.0000 mg | ORAL_TABLET | Freq: Two times a day (BID) | ORAL | Status: DC
  Administered 2021-04-28 – 2021-05-01 (×6): 5 mg via ORAL
  Filled 2021-04-28 (×8): qty 0.5

## 2021-04-28 MED ORDER — CARBOXYMETHYLCELLULOSE SODIUM 0.5 % OP SOLN
1.0000 [drp] | Freq: Two times a day (BID) | OPHTHALMIC | Status: DC
  Administered 2021-04-28: 1 [drp] via OPHTHALMIC

## 2021-04-28 MED ORDER — DIVALPROEX SODIUM 250 MG PO DR TAB
250.0000 mg | DELAYED_RELEASE_TABLET | Freq: Every day | ORAL | Status: DC
  Administered 2021-04-28 – 2021-04-30 (×3): 250 mg via ORAL
  Filled 2021-04-28 (×4): qty 1

## 2021-04-28 MED ORDER — MIRTAZAPINE 15 MG PO TABS
7.5000 mg | ORAL_TABLET | Freq: Every day | ORAL | Status: DC
  Administered 2021-04-28 – 2021-04-30 (×3): 7.5 mg via ORAL
  Filled 2021-04-28 (×3): qty 1

## 2021-04-28 MED ORDER — ENALAPRIL MALEATE 10 MG PO TABS
10.0000 mg | ORAL_TABLET | Freq: Two times a day (BID) | ORAL | Status: DC
  Administered 2021-04-28 – 2021-05-01 (×6): 10 mg via ORAL
  Filled 2021-04-28 (×7): qty 1

## 2021-04-28 MED ORDER — POLYETHYLENE GLYCOL 3350 17 G PO PACK: 17.0000 g | PACK | Freq: Every day | ORAL | Status: DC | PRN

## 2021-04-28 MED ORDER — SODIUM CHLORIDE 0.9 % IV SOLN
1.0000 g | Freq: Once | INTRAVENOUS | Status: DC
  Filled 2021-04-28: qty 10

## 2021-04-28 MED ORDER — ENOXAPARIN SODIUM 40 MG/0.4ML IJ SOSY
40.0000 mg | PREFILLED_SYRINGE | INTRAMUSCULAR | Status: DC
  Administered 2021-04-28 – 2021-04-30 (×3): 40 mg via SUBCUTANEOUS
  Filled 2021-04-28 (×3): qty 0.4

## 2021-04-28 MED ORDER — ACETAMINOPHEN 650 MG RE SUPP: 650.0000 mg | Freq: Four times a day (QID) | RECTAL | Status: DC | PRN

## 2021-04-28 MED ORDER — TRAZODONE HCL 50 MG PO TABS
150.0000 mg | ORAL_TABLET | Freq: Every day | ORAL | Status: DC
  Administered 2021-04-28 – 2021-04-30 (×3): 150 mg via ORAL
  Filled 2021-04-28 (×2): qty 1

## 2021-04-28 MED ORDER — POLYETHYLENE GLYCOL 3350 17 G PO PACK
17.0000 g | PACK | Freq: Every day | ORAL | Status: DC
  Administered 2021-04-29 – 2021-05-01 (×3): 17 g via ORAL
  Filled 2021-04-28 (×3): qty 1

## 2021-04-28 MED ORDER — FLUTICASONE FUROATE-VILANTEROL 200-25 MCG/INH IN AEPB
1.0000 | INHALATION_SPRAY | Freq: Every day | RESPIRATORY_TRACT | Status: DC
  Administered 2021-04-29 – 2021-05-01 (×3): 1 via RESPIRATORY_TRACT
  Filled 2021-04-28: qty 28

## 2021-04-28 MED ORDER — BISACODYL 5 MG PO TBEC: 5.0000 mg | DELAYED_RELEASE_TABLET | Freq: Every day | ORAL | Status: DC | PRN

## 2021-04-28 NOTE — ED Notes (Signed)
Patient is resting comfortably. 

## 2021-04-28 NOTE — H&P (Signed)
History and Physical    Kari Mcdonald [id number] DOB: [birth year] DOA: 04/28/2021  PCP: Sofie Hartigan, MD  Chief Complaint: Fall, shortness of breath  HPI: Kari Mcdonald is a 87 y.o. female with a past medical history of COPD with chronic hypoxic respiratory failure on 2 L at night, hypertension, tobacco dependence currently smoking 2 packs/day, osteoporosis, GERD, depression, history of myocardial infarction, ambulatory dysfunction mostly wheelchair-bound.  She presents from [place] where she is a long-term resident.  She had a fall at the nursing home on Thursday and Friday of last week but was not sent to the ER for this.  Weekend staff reported that she was complaining of left shoulder, left wrist pain for the past 2 days.  Her left knee is more swollen than usual.  The patient is typically able to transfer from wheelchair to bed/toilet without assistance and is usually able to wheel herself outside to smoke cigarettes.  She has not been to do that this weekend.  Daughter is present upon my evaluation at bedside.  She was noted to be 84% on room air in the emergency department.  Other vital signs are fairly stable except for some mild tachypnea.  Not in any respiratory distress upon my evaluation.  She does have a chronic cough.  Has been having more phlegm that is green colored.  She denies any fevers or chills.  No significant wheezing.  No nausea or vomiting or diarrhea.  No abdominal pain.  She denies any urinary symptoms including dysuria or frequency.  The daughter states that she is more confused.  She can usually tell people her birthday.  She is not able to do that right now.  She can only tell me her name and today's date.  She thinks she is in the nursing home currently.  ED Course: UA, troponin, BMP, lactic acid, CBC.  Ceftriaxone 1 g IV x1.  Placed on 2 L of oxygen supplementation and saturating well.  Review of Systems: 14 point review of systems is  negative except for what is mentioned above in the HPI.   Past Medical History:  Diagnosis Date   Anemia    Arthritis    left knee   Cancer ([place])    Skin Cancer   COPD (chronic obstructive pulmonary disease) ([place])    Coronary artery disease    Depression    Diverticulosis    Dysphagia    Dyspnea    GERD (gastroesophageal reflux disease)    GI bleed    Headache    from eye strain   History of hiatal hernia    HOH (hard of hearing)    Hyperlipidemia    Hypertension    Myocardial infarction [facility])    2006   Osteoporosis    Wears dentures    full upper and lower    Past Surgical History:  Procedure Laterality Date   ABDOMINAL HYSTERECTOMY     APPENDECTOMY     CATARACT EXTRACTION W/PHACO Left 08/06/2016   Procedure: CATARACT EXTRACTION PHACO AND INTRAOCULAR LENS PLACEMENT ([place]);  Surgeon: Leandrew Koyanagi, MD;  Location: [place];  Service: Ophthalmology;  Laterality: Left;   CATARACT EXTRACTION W/PHACO Right 09/10/2016   Procedure: CATARACT EXTRACTION PHACO AND INTRAOCULAR LENS PLACEMENT ([facility]);  Surgeon: Leandrew Koyanagi, MD;  Location: [place];  Service: Ophthalmology;  Laterality: Right;   CHOLECYSTECTOMY     COLONOSCOPY     COLONOSCOPY WITH PROPOFOL N/A 12/22/2016   Procedure: COLONOSCOPY WITH PROPOFOL;  Surgeon: Lucilla Lame, MD;  Location: [place];  Service: Endoscopy;  Laterality: N/A;   ESOPHAGOGASTRODUODENOSCOPY     ESOPHAGOGASTRODUODENOSCOPY (EGD) WITH PROPOFOL N/A 07/21/2016   Procedure: ESOPHAGOGASTRODUODENOSCOPY (EGD) WITH PROPOFOL;  Surgeon: Lollie Sails, MD;  Location: [facility] ENDOSCOPY;  Service: Endoscopy;  Laterality: N/A;   GANGLION CYST EXCISION Left    wrist (x2)   INTRAMEDULLARY (IM) NAIL INTERTROCHANTERIC Left 06/01/2017   Procedure: INTRAMEDULLARY (IM) NAIL INTERTROCHANTRIC;  Surgeon: Hessie Knows, MD;  Location: [facility];  Service: Orthopedics;  Laterality: Left;   PILONIDAL CYST EXCISION      Social  History   Socioeconomic History   Marital status: Widowed    Spouse name: Not on file   Number of children: Not on file   Years of education: Not on file   Highest education level: Not on file  Occupational History   Not on file  Tobacco Use   Smoking status: Every Day    Packs/day: 0.50    Years: 60.00    Pack years: 30.00    Types: Cigarettes   Smokeless tobacco: Never  Substance and Sexual Activity   Alcohol use: No   Drug use: No   Sexual activity: Not on file  Other Topics Concern   Not on file  Social History Narrative   Not on file   Social Determinants of Health   Financial Resource Strain: Not on file  Food Insecurity: Not on file  Transportation Needs: Not on file  Physical Activity: Not on file  Stress: Not on file  Social Connections: Not on file  Intimate Partner Violence: Not on file    Allergies  Allergen Reactions   Penicillins Rash    Has patient had a PCN reaction causing immediate rash, facial/tongue/throat swelling, SOB or lightheadedness with hypotension: Unknown Has patient had a PCN reaction causing severe rash involving mucus membranes or skin necrosis: Unknown Has patient had a PCN reaction that required hospitalization: Unknown Has patient had a PCN reaction occurring within the last 10 years: Unknown If all of the above answers are "NO", then may proceed with Cephalosporin use.     Family History  Problem Relation Age of Onset   Alzheimer's disease Mother    Lymphoma Father     Prior to Admission medications   Medication Sig Start Date End Date Taking? Authorizing Provider  Baclofen 5 MG TABS Take 5 mg by mouth 2 (two) times daily.   Yes [provider]  bisacodyl (DULCOLAX) 10 MG suppository Place 10 mg rectally daily as needed for moderate constipation or severe constipation.   Yes [provider]  citalopram (CELEXA) 20 MG tablet Take 20 mg by mouth daily.   Yes [provider]  Fluticasone-Salmeterol  (ADVAIR) 250-50 MCG/DOSE AEPB Inhale 1 puff into the lungs every 12 (twelve) hours.   Yes [provider]  albuterol (PROVENTIL HFA;VENTOLIN HFA) 108 (90 Base) MCG/ACT inhaler Inhale 2 puffs into the lungs every 6 (six) hours as needed for wheezing or shortness of breath.    [provider]  atorvastatin (LIPITOR) 20 MG tablet Take 20 mg by mouth daily.    [provider]  omeprazole (PRILOSEC) 20 MG capsule Take 20 mg by mouth daily.    [provider]  tiotropium (SPIRIVA) 18 MCG inhalation capsule Place 18 mcg into inhaler and inhale daily.    [provider]  traZODone (DESYREL) 100 MG tablet Take 200 mg by mouth at bedtime.    [provider]  Physical Exam: Vitals:   04/28/21 1400 04/28/21 1430 04/28/21 1530 04/28/21 1630  BP: (!) 148/69 (!) 144/66 120/87 (!) 147/91  Pulse: 94 69 94 95  Resp: (!) '22 20 20   '$ Temp:      TempSrc:      SpO2: 98% 93% 99%   Weight:      Height:         General:  Appears calm and comfortable and is in NAD Cardiovascular: Mild rhonchi primarily in the upper lobes with productive cough noted at bedside Respiratory:   CTA bilaterally with no wheezes/rales/rhonchi.  Normal respiratory effort. Abdomen:  soft, NT, ND, NABS Skin: Left knee swollen with no erythema, warmth or ecchymosis Musculoskeletal:  grossly normal tone BUE/BLE, good ROM, no bony abnormality Lower extremity:  No LE edema.  Limited foot exam with no ulcerations.  2+ distal pulses. Psychiatric:  grossly normal mood and affect, speech fluent and appropriate, Aox2 to name and date only. Cannot state where she is or tell me her birthday. Neurologic:  CN 2-12 grossly intact, moves all extremities in coordinated fashion, sensation intact    Radiological Exams on Admission: Independently reviewed - see discussion in A/P where applicable  DG Chest 2 View  Result Date: 04/28/2021 CLINICAL DATA:  Dyspnea, hypoxia, fall EXAM: CHEST - 2  VIEW COMPARISON:  07/21/2018 chest radiograph. FINDINGS: Stable cardiomediastinal silhouette with normal heart size. No pneumothorax. No pleural effusion. Patchy left lung base consolidation is new. Clear right lung. No displaced fractures in the visualized chest. IMPRESSION: New patchy left lung base consolidation, suggesting pneumonia. Chest radiograph follow-up advised. Electronically Signed   By: Ilona Sorrel M.D.   On: 04/28/2021 11:26   DG Wrist Complete Left  Result Date: 04/28/2021 CLINICAL DATA:  Left wrist pain after fall. EXAM: LEFT WRIST - COMPLETE 3+ VIEW COMPARISON:  None. FINDINGS: There is no evidence of fracture or dislocation. Severe degenerative changes seen involving the first carpometacarpal joint. Chondrocalcinosis is seen involving the triangular fibrocartilage. Soft tissues are unremarkable. IMPRESSION: Severe osteoarthritis of first carpometacarpal joint. No acute abnormality seen. Electronically Signed   By: Marijo Conception M.D.   On: 04/28/2021 13:48   CT Head Wo Contrast  Result Date: 04/28/2021 CLINICAL DATA:  Fall.  Hypoxia. EXAM: CT HEAD WITHOUT CONTRAST TECHNIQUE: Contiguous axial images were obtained from the base of the skull through the vertex without intravenous contrast. COMPARISON:  11/27/2013 FINDINGS: Brain: No evidence of acute infarction, hemorrhage, hydrocephalus, extra-axial collection or mass lesion/mass effect. Mild ventricular and sulcal enlargement reflects age appropriate volume loss. There is mild periventricular white matter hypoattenuation consistent with chronic microvascular ischemic change. Vascular: No hyperdense vessel or unexpected calcification. Skull: Normal. Negative for fracture or focal lesion. Sinuses/Orbits: Globes and orbits are unremarkable. Visualized sinuses are clear. Other: None. IMPRESSION: 1. No acute intracranial abnormalities. 2. Age-appropriate volume loss. Mild chronic microvascular ischemic change. Electronically Signed   By: Lajean Manes M.D.   On: 04/28/2021 15:58   DG Shoulder Left  Result Date: 04/28/2021 CLINICAL DATA:  Left shoulder pain after fall. EXAM: LEFT SHOULDER - 2+ VIEW COMPARISON:  None. FINDINGS: There is no evidence of fracture or dislocation. Severe degenerative changes seen involving the left glenohumeral joint. Soft tissues are unremarkable. IMPRESSION: Severe degenerative joint disease of the left glenohumeral joint. No acute abnormality seen. Electronically Signed   By: Marijo Conception M.D.   On: 04/28/2021 13:46   DG Knee Complete 4 Views Left  Result Date: 04/28/2021 CLINICAL DATA:  Left knee pain after fall. EXAM: LEFT KNEE - COMPLETE 4+ VIEW COMPARISON:  July 21, 2018. FINDINGS: No definite fracture or dislocation is noted. Small suprapatellar joint effusion is noted. Severe degenerative changes seen medially and laterally with osteophyte formation. Status post intramedullary rod fixation of right femur. IMPRESSION: Small suprapatellar joint effusion. Severe degenerative joint disease. No fracture or dislocation is noted. Electronically Signed   By: Marijo Conception M.D.   On: 04/28/2021 13:44    EKG: Independently reviewed.  Sinus tachycardia with PACs, rate 107   Labs on Admission: I have personally reviewed the available labs and imaging studies at the time of the admission.  Pertinent labs: Urinalysis: Nitrite positive, moderate leukocytes, WBC 21-50, troponin 20, lactic acid 1.8, blood glucose 101, hemoglobin 11.6, hematocrit 35.6.     Assessment/Plan: Acute on chronic hypoxic respiratory failure secondary to community-acquired pneumonia: The patient will be admitted to the medical/surgical floor under inpatient status with cardiac monitoring.  She meets hypoxic respiratory failure criteria with saturation of 84% on room air in the emergency department.  She is chronically on 2 L at night only.  Now on 2 L and saturating well.  Wean oxygen as tolerated.  Given ceftriaxone 1 g IV x1 in the  emergency department.  We will continue this daily.  In addition we will start Zithromax 500 mg IV daily.  Chest x-ray shows left lower lobe consolidation.  We will obtain a COVID-19/influenza and RSV PCR.  We will obtain a Legionella urine antigen and strep pneumo urine antigen.  Start DuoNeb scheduled and as needed.  Supportive care with Mucinex.  Sputum culture ordered.  Spiriva and Breo inhaler ordered daily.  Respiratory panel has been ordered.  Acute metabolic encephalopathy likely secondary to uncomplicated UTI/CAP: Urinalysis is positive consistent with a urinary tract infection.  Continue with ceftriaxone as mentioned above.  Urine culture has been ordered.  Fall: Plain film imaging of the left wrist, left shoulder and left knee no acute fracture or dislocation.  She does have a small suprapatellar joint effusion.  Continue with supportive care.  PT/OT evaluation ordered.  Essential hypertension: Continue home amlodipine 10 mg daily and Vasotec 10 mg twice daily.  Depression: Continue home Celexa 20 mg daily.  Insomnia: Continue home Remeron 7.5 mg nightly and trazodone 150 mg nightly.  GERD: Continue home Prilosec 40 mg daily  Tobacco dependence: Counseled on tobacco cessation.  Nicotine patch 21 mg daily.  COPD with chronic hypoxic respiratory failure: Not in acute exacerbation.  Level of Care: MedSurg DVT prophylaxis: Lovenox subcu Code Status: Full code Consults: None Admission status: Inpatient   Leslee Home DO [facility]   How to contact the [facility] Attending or Consulting provider [place] or covering provider during after hours [place], for this patient?  Check the care team in [facility] and look for a) attending/consulting [facility] provider listed and b) the [facility] team listed Log into [email or web address] and use [place]'s universal password to access. If you do not have the password, please contact the hospital operator. Locate the [facility] provider you are looking for under [facility] and page to a number that you can be directly reached. If you still have difficulty reaching the provider, please page the [facility] (Director on Call) for the Hospitalists listed on amion for assistance.   04/28/2021, 4:59 PM

## 2021-04-28 NOTE — ED Triage Notes (Signed)
Pt comes into the ED via [facility] from [facility] c/o hypoxia and fall.  Pt was unaware of any fall so they dont know when it was was.  Pt is c/o of left arm and left leg pain.  Per EMS, upon arrival her O2 sats were at 95% on 2L, 88 HR, 141/54.  Initial call out had BP at 80's/50's. Pt does wear O2 at night and is currently still on it.

## 2021-04-28 NOTE — ED Notes (Signed)
Patient transported to CT 

## 2021-04-28 NOTE — ED Provider Notes (Signed)
[facility] Emergency Department Provider Note ____________________________________________   Event Date/Time   First MD Initiated Contact with Patient 04/28/21 1206     (approximate)  I have reviewed the triage vital signs and the nursing notes.   HISTORY  Chief Complaint Fall and Hypoxia  HPI Kari Mcdonald is a 87 y.o. female with history of COPD, hypertension, MI, remaining history as listed below presents to the emergency department for treatment and evaluation of hypoxia and fall.  Patient unable to give any information regarding the fall.  She complains of left arm and left leg pain.       Past Medical History:  Diagnosis Date   Anemia    Arthritis    left knee   Cancer ([place])    Skin Cancer   COPD (chronic obstructive pulmonary disease) ([place])    Coronary artery disease    Depression    Diverticulosis    Dysphagia    Dyspnea    GERD (gastroesophageal reflux disease)    GI bleed    Headache    from eye strain   History of hiatal hernia    HOH (hard of hearing)    Hyperlipidemia    Hypertension    Myocardial infarction ([place])    2006   Osteoporosis    Wears dentures    full upper and lower    Patient Active Problem List   Diagnosis Date Noted   CAP (community acquired pneumonia) 04/28/2021   Lymphedema 11/01/2018   PAD (peripheral artery disease) ([place]) 11/01/2018   Hyponatremia 07/21/2018   Closed left hip fracture ([place]) 06/01/2017   Noninfectious diarrhea    Anemia 12/18/2016   COPD (chronic obstructive pulmonary disease) ([place]) 12/18/2016   Decreased hearing 12/18/2016   Depression 12/18/2016   GERD (gastroesophageal reflux disease) 12/18/2016   GI bleed 12/18/2016   CAD (coronary artery disease) 12/18/2016   Hypertension 12/18/2016   Knee osteoarthritis 12/18/2016   Osteoporosis 12/18/2016   Seasonal allergies 12/18/2016   Skin cancer 12/18/2016   AKI (acute kidney injury) ([place]) 11/17/2016   Chronic diarrhea     Loss of weight    Intractable cyclical vomiting with nausea    Hyperlipidemia, mixed 10/04/2014    Past Surgical History:  Procedure Laterality Date   ABDOMINAL HYSTERECTOMY     APPENDECTOMY     CATARACT EXTRACTION W/PHACO Left 08/06/2016   Procedure: CATARACT EXTRACTION PHACO AND INTRAOCULAR LENS PLACEMENT (IOC);  Surgeon: Leandrew Koyanagi, MD;  Location: [place];  Service: Ophthalmology;  Laterality: Left;   CATARACT EXTRACTION W/PHACO Right 09/10/2016   Procedure: CATARACT EXTRACTION PHACO AND INTRAOCULAR LENS PLACEMENT (IOC);  Surgeon: Leandrew Koyanagi, MD;  Location: [place];  Service: Ophthalmology;  Laterality: Right;   CHOLECYSTECTOMY     COLONOSCOPY     COLONOSCOPY WITH PROPOFOL N/A 12/22/2016   Procedure: COLONOSCOPY WITH PROPOFOL;  Surgeon: Lucilla Lame, MD;  Location: [place];  Service: Endoscopy;  Laterality: N/A;   ESOPHAGOGASTRODUODENOSCOPY     ESOPHAGOGASTRODUODENOSCOPY (EGD) WITH PROPOFOL N/A 07/21/2016   Procedure: ESOPHAGOGASTRODUODENOSCOPY (EGD) WITH PROPOFOL;  Surgeon: Lollie Sails, MD;  Location: [facility] ENDOSCOPY;  Service: Endoscopy;  Laterality: N/A;   GANGLION CYST EXCISION Left    wrist (x2)   INTRAMEDULLARY (IM) NAIL INTERTROCHANTERIC Left 06/01/2017   Procedure: INTRAMEDULLARY (IM) NAIL INTERTROCHANTRIC;  Surgeon: Hessie Knows, MD;  Location: [facility];  Service: Orthopedics;  Laterality: Left;   PILONIDAL CYST EXCISION      Prior to Admission medications  Medication Sig Start Date End Date Taking? Authorizing Provider  Baclofen 5 MG TABS Take 5 mg by mouth 2 (two) times daily.   Yes [provider]  bisacodyl (DULCOLAX) 10 MG suppository Place 10 mg rectally daily as needed for moderate constipation or severe constipation.   Yes [provider]  citalopram (CELEXA) 20 MG tablet Take 20 mg by mouth daily.   Yes [provider]  divalproex (DEPAKOTE) 125 MG DR tablet Take 125 mg by mouth 2  (two) times daily. (0900 and 1400)   Yes [provider]  divalproex (DEPAKOTE) 125 MG DR tablet Take 250 mg by mouth at bedtime.   Yes [provider]  Fluticasone-Salmeterol (ADVAIR) 250-50 MCG/DOSE AEPB Inhale 1 puff into the lungs every 12 (twelve) hours.   Yes [provider]  albuterol (PROVENTIL HFA;VENTOLIN HFA) 108 (90 Base) MCG/ACT inhaler Inhale 2 puffs into the lungs every 6 (six) hours as needed for wheezing or shortness of breath.    [provider]  atorvastatin (LIPITOR) 20 MG tablet Take 20 mg by mouth daily.    [provider]  omeprazole (PRILOSEC) 20 MG capsule Take 20 mg by mouth daily.    [provider]  tiotropium (SPIRIVA) 18 MCG inhalation capsule Place 18 mcg into inhaler and inhale daily.    [provider]  traZODone (DESYREL) 100 MG tablet Take 200 mg by mouth at bedtime.    [provider]    Allergies Penicillins  Family History  Problem Relation Age of Onset   Alzheimer's disease Mother    Lymphoma Father     Social History Social History   Tobacco Use   Smoking status: Every Day    Packs/day: 0.50    Years: 60.00    Pack years: 30.00    Types: Cigarettes   Smokeless tobacco: Never  Substance Use Topics   Alcohol use: No   Drug use: No    Review of Systems  Level 5 caveat : Dementia  ____________________________________________   PHYSICAL EXAM:  VITAL SIGNS: ED Triage Vitals  Enc Vitals Group     BP 04/28/21 1030 (!) 155/82     Pulse Rate 04/28/21 1030 (!) 116     Resp 04/28/21 1030 (!) 24     Temp 04/28/21 1030 98.5 F (36.9 C)     Temp Source 04/28/21 1030 Oral     SpO2 04/28/21 1030 (!) 84 %     Weight 04/28/21 1034 167 lb 15.9 oz (76.2 kg)     Height 04/28/21 1034 '5\' 5"'$  (1.651 m)     Head Circumference --      Peak Flow --      Pain Score 04/28/21 1034 0     Pain Loc --      Pain Edu? --      Excl. in [place]? --     Constitutional: Alert.  Oriented to  person and place.  Disoriented to year.  Chronically ill appearing and in no acute distress. Eyes: Conjunctivae are normal. PERRL. EOMI. Head: Atraumatic. Nose: No congestion/rhinnorhea. Mouth/Throat: Atraumatic .mucous membranes are moist.  Oropharynx non-erythematous. Neck: No stridor.   Hematological/Lymphatic/Immunilogical: No cervical lymphadenopathy. Cardiovascular: Normal rate, regular rhythm. Grossly normal heart sounds.  Good peripheral circulation. Respiratory: Normal respiratory effort.  No retractions. Lungs CTAB. Gastrointestinal: Soft and nontender.  Guarding to palpation over the suprapubic area  genitourinary:  Musculoskeletal: Diffuse edema over the left knee.  Patient reports pain with attempt to flex.  Resistance  to passive range of motion of the left upper extremity.  Patient demonstrates active range of motion of the right upper and lower extremity Neurologic:  Normal speech and language. No gross focal neurologic deficits are appreciated. No gait instability. Skin: Superficial skin tears noted over the prepatellar surface of the right lower extremity. Psychiatric: Mood and affect are normal. Speech and behavior are normal.  ____________________________________________   LABS (all labs ordered are listed, but only abnormal results are displayed)  Labs Reviewed  BASIC METABOLIC PANEL - Abnormal; Notable for the following components:      Result Value   Glucose, Bld 101 (*)    All other components within normal limits  CBC - Abnormal; Notable for the following components:   RBC 3.64 (*)    Hemoglobin 11.6 (*)    HCT 35.6 (*)    All other components within normal limits  URINALYSIS, COMPLETE (UACMP) WITH MICROSCOPIC - Abnormal; Notable for the following components:   Color, Urine YELLOW (*)    APPearance HAZY (*)    Hgb urine dipstick SMALL (*)    Nitrite POSITIVE (*)    Leukocytes,Ua MODERATE (*)    Bacteria, UA FEW (*)    All other components within normal  limits  TROPONIN I (HIGH SENSITIVITY) - Abnormal; Notable for the following components:   Troponin I (High Sensitivity) 20 (*)    All other components within normal limits  URINE CULTURE  EXPECTORATED SPUTUM ASSESSMENT W GRAM STAIN, RFLX TO RESP C  RESP PANEL BY RT-PCR (FLU A&B, COVID) ARPGX2  RESPIRATORY PANEL BY PCR  LACTIC ACID, PLASMA  LEGIONELLA PNEUMOPHILA SEROGP 1 UR AG  STREP PNEUMONIAE URINARY ANTIGEN  BASIC METABOLIC PANEL  CBC  TROPONIN I (HIGH SENSITIVITY)   ____________________________________________  EKG  ED ECG REPORT I, Jamileth Putzier, FNP-BC personally viewed and interpreted this ECG.   Date: 04/28/2021  EKG Time: 1045  Rate: 107  Rhythm: sinus tachycardia  Axis: normal  Intervals:none  ST&T Change: no ST elevation  ____________________________________________  RADIOLOGY  ED MD interpretation:    Left lower lobe pneumonia.  I, Sherrie George, personally viewed and evaluated these images (plain radiographs) as part of my medical decision making, as well as reviewing the written report by the radiologist.  Official radiology report(s): DG Chest 2 View  Result Date: 04/28/2021 CLINICAL DATA:  Dyspnea, hypoxia, fall EXAM: CHEST - 2 VIEW COMPARISON:  07/21/2018 chest radiograph. FINDINGS: Stable cardiomediastinal silhouette with normal heart size. No pneumothorax. No pleural effusion. Patchy left lung base consolidation is new. Clear right lung. No displaced fractures in the visualized chest. IMPRESSION: New patchy left lung base consolidation, suggesting pneumonia. Chest radiograph follow-up advised. Electronically Signed   By: Ilona Sorrel M.D.   On: 04/28/2021 11:26   DG Wrist Complete Left  Result Date: 04/28/2021 CLINICAL DATA:  Left wrist pain after fall. EXAM: LEFT WRIST - COMPLETE 3+ VIEW COMPARISON:  None. FINDINGS: There is no evidence of fracture or dislocation. Severe degenerative changes seen involving the first carpometacarpal joint.  Chondrocalcinosis is seen involving the triangular fibrocartilage. Soft tissues are unremarkable. IMPRESSION: Severe osteoarthritis of first carpometacarpal joint. No acute abnormality seen. Electronically Signed   By: Marijo Conception M.D.   On: 04/28/2021 13:48   CT Head Wo Contrast  Result Date: 04/28/2021 CLINICAL DATA:  Fall.  Hypoxia. EXAM: CT HEAD WITHOUT CONTRAST TECHNIQUE: Contiguous axial images were obtained from the base of the skull through the vertex without intravenous contrast. COMPARISON:  11/27/2013 FINDINGS: Brain:  No evidence of acute infarction, hemorrhage, hydrocephalus, extra-axial collection or mass lesion/mass effect. Mild ventricular and sulcal enlargement reflects age appropriate volume loss. There is mild periventricular white matter hypoattenuation consistent with chronic microvascular ischemic change. Vascular: No hyperdense vessel or unexpected calcification. Skull: Normal. Negative for fracture or focal lesion. Sinuses/Orbits: Globes and orbits are unremarkable. Visualized sinuses are clear. Other: None. IMPRESSION: 1. No acute intracranial abnormalities. 2. Age-appropriate volume loss. Mild chronic microvascular ischemic change. Electronically Signed   By: Lajean Manes M.D.   On: 04/28/2021 15:58   DG Shoulder Left  Result Date: 04/28/2021 CLINICAL DATA:  Left shoulder pain after fall. EXAM: LEFT SHOULDER - 2+ VIEW COMPARISON:  None. FINDINGS: There is no evidence of fracture or dislocation. Severe degenerative changes seen involving the left glenohumeral joint. Soft tissues are unremarkable. IMPRESSION: Severe degenerative joint disease of the left glenohumeral joint. No acute abnormality seen. Electronically Signed   By: Marijo Conception M.D.   On: 04/28/2021 13:46   DG Knee Complete 4 Views Left  Result Date: 04/28/2021 CLINICAL DATA:  Left knee pain after fall. EXAM: LEFT KNEE - COMPLETE 4+ VIEW COMPARISON:  July 21, 2018. FINDINGS: No definite fracture or  dislocation is noted. Small suprapatellar joint effusion is noted. Severe degenerative changes seen medially and laterally with osteophyte formation. Status post intramedullary rod fixation of right femur. IMPRESSION: Small suprapatellar joint effusion. Severe degenerative joint disease. No fracture or dislocation is noted. Electronically Signed   By: Marijo Conception M.D.   On: 04/28/2021 13:44    ____________________________________________   PROCEDURES  Procedure(s) performed (including Critical Care):  Procedures  ____________________________________________   INITIAL IMPRESSION / ASSESSMENT AND PLAN     87 year old female presenting to the emergency department from [facility].  See HPI for further details.  Initial labs overall reassuring.  EKG is without acute findings.  She has slightly tachycardic and tachypneic with an initial room air saturation of 84%.  She is typically only on oxygen at night.  Plan will be to get a troponin, urinalysis, lactic, and images of the left upper and lower extremities.  DIFFERENTIAL DIAGNOSIS  Acute respiratory failure, pneumonia, UTI, sepsis, acute bony findings of the left upper or lower extremity.  ED COURSE    Clinical Course as of 04/28/21 1711  Sun Apr 28, 2021  1225 Staff at [facility] at [place] report that patient fell Thursday or Friday of last week. Weekend staff reports that she has complained of left shoulder and wrist pain for the past 2 days. Her left knee is more swollen than usual as well. Patient typically able to transfer from wheelchair to bed/toilet without assistance and is usually able to wheel herself outside to smoke. She has not been able to do any of that this weekend. (Typically smokes 2 packs per day). She has also been unable to feed herself since Wednesday.  [CT]  1459 Nitrate positive UTI--urine culture requested.  Left lower lobe opacity consistent with pneumonia.  Plan will be to admit her for  IV antibiotics, hypoxia, and PT OT evaluation. [CT]  [id number] Hospitalist consult entered a second time. First was cancelled by me to await head CT results.   [CT]  1711 Accepted for admission. [CT]    Clinical Course User Index [CT] Darby Shadwick B, FNP   ___________________________________________   FINAL CLINICAL IMPRESSION(S) / ED DIAGNOSES  Final diagnoses:  Pneumonia of left lower lobe due to infectious organism  Acute cystitis without hematuria  Knee effusion, left     ED Discharge Orders     None        VIRGA KOELSCH was evaluated in Emergency Department on 04/28/2021 for the symptoms described in the history of present illness. She was evaluated in the context of the global COVID-19 pandemic, which necessitated consideration that the patient might be at risk for infection with the SARS-CoV-2 virus that causes COVID-19. Institutional protocols and algorithms that pertain to the evaluation of patients at risk for COVID-19 are in a state of rapid change based on information released by regulatory bodies including the CDC and federal and state organizations. These policies and algorithms were followed during the patient's care in the ED.   Note:  This document was prepared using Dragon voice recognition software and may include unintentional dictation errors.    Victorino Dike, FNP 04/28/21 1712    Lucrezia Starch, MD 04/28/21 2031

## 2021-04-29 DIAGNOSIS — J189 Pneumonia, unspecified organism: Secondary | ICD-10-CM | POA: Diagnosis not present

## 2021-04-29 LAB — BASIC METABOLIC PANEL
Anion gap: 6 (ref 5–15)
BUN: 13 mg/dL (ref 8–23)
CO2: 26 mmol/L (ref 22–32)
Calcium: 8.6 mg/dL — ABNORMAL LOW (ref 8.9–10.3)
Chloride: 106 mmol/L (ref 98–111)
Creatinine, Ser: 0.62 mg/dL (ref 0.44–1.00)
GFR, Estimated: >60 mL/min (ref >60)
Glucose, Bld: 92 mg/dL (ref 70–99)
Potassium: 4.2 mmol/L (ref 3.5–5.1)
Sodium: 138 mmol/L (ref 135–145)

## 2021-04-29 LAB — CBC
HCT: 30.2 % — ABNORMAL LOW (ref 36.0–46.0)
Hemoglobin: 9.8 g/dL — ABNORMAL LOW (ref 12.0–15.0)
MCH: 31.2 pg (ref 26.0–34.0)
MCHC: 32.5 g/dL (ref 30.0–36.0)
MCV: 96.2 fL (ref 80.0–100.0)
Platelets: 252 10*3/uL (ref 150–400)
RBC: 3.14 MIL/uL — ABNORMAL LOW (ref 3.87–5.11)
RDW: 13.9 % (ref 11.5–15.5)
WBC: 7.6 10*3/uL (ref 4.0–10.5)
nRBC: 0 % (ref 0.0–0.2)

## 2021-04-29 LAB — STREP PNEUMONIAE URINARY ANTIGEN: Strep Pneumo Urinary Antigen: NEGATIVE

## 2021-04-29 LAB — MRSA NEXT GEN BY PCR, NASAL: MRSA by PCR Next Gen: NOT DETECTED

## 2021-04-29 MED ORDER — ADULT MULTIVITAMIN W/MINERALS CH
1.0000 | ORAL_TABLET | Freq: Every day | ORAL | Status: DC
  Administered 2021-04-30 – 2021-05-01 (×2): 1 via ORAL
  Filled 2021-04-29 (×2): qty 1

## 2021-04-29 MED ORDER — POLYVINYL ALCOHOL 1.4 % OP SOLN
1.0000 [drp] | Freq: Two times a day (BID) | OPHTHALMIC | Status: DC
  Administered 2021-04-29 – 2021-05-01 (×5): 1 [drp] via OPHTHALMIC
  Filled 2021-04-29: qty 15

## 2021-04-29 MED ORDER — ENSURE ENLIVE PO LIQD
237.0000 mL | Freq: Two times a day (BID) | ORAL | Status: DC
  Administered 2021-04-29 – 2021-05-01 (×5): 237 mL via ORAL

## 2021-04-29 NOTE — Progress Notes (Signed)
PROGRESS NOTE    Kari Mcdonald  [id number] DOB: 1932/02/22 DOA: 04/28/2021 PCP: Sofie Hartigan, MD   Brief Narrative: 87 y.o. female with a past medical history of COPD with chronic hypoxic respiratory failure on 2 L at night, hypertension, tobacco dependence currently smoking 2 packs/day, osteoporosis, GERD, depression, history of myocardial infarction, ambulatory dysfunction mostly wheelchair-bound.   She presents from [facility] where she is a long-term resident.  She had a fall at the nursing home on Thursday and Friday of last week but was not sent to the ER for this.  Weekend staff reported that she was complaining of left shoulder, left wrist pain for the past 2 days.  Her left knee is more swollen than usual.  The patient is typically able to transfer from wheelchair to bed/toilet without assistance and is usually able to wheel herself outside to smoke cigarettes.  She has not been to do that this weekend.   Patient had evidence of a urinary tract infection with noted bacteria on urinalysis.  She also has evidence of community-acquired pneumonia with hazy infiltrate on chest x-ray.  Started on community-acquired pneumonia treatment.  All cultures no growth to date.  Assessment & Plan:   Active Problems:   CAP (community acquired pneumonia)  Acute on chronic hypoxic respiratory failure Community-acquired pneumonia Patient meets hypoxic respiratory failure criteria with 84% saturation on room air She does use oxygen but only at night Plan: Continue oxygen, wean as tolerated Goal saturation 88-92% Continue Rocephin and azithromycin Monitor vitals and fever curve Supplemental inhalers  Urinary tract infection Patient with evidence of a UTI on urinalysis On Rocephin for CAP, this will cover urinary pathogens Monitor cultures  Acute metabolic encephalopathy Likely secondary to above infections Mental status appears to be improving Continue treating  infections Delirium precautions  Mechanical fall No acute fractures or dislocations Pain control as needed Supportive care Therapy evaluations  Essential hypertension Norvasc 10 daily Vasotec 10 daily  Depression Insomnia PTA Celexa PTA Remeron and thjhjrazodone  GERD PPI  Tobacco dependence Smokes 2 packs daily Nicotine patch ordered  COPD No evidence of acute exacerbation    DVT prophylaxis: SQ Lovenox Code Status: Full Family Communication: Daughter at bedside Disposition Plan: Status is: Inpatient  Remains inpatient appropriate because:Inpatient level of care appropriate due to severity of illness  Dispo: The patient is from: SNF              Anticipated d/c is to: SNF              Patient currently is not medically stable to d/c.   Difficult to place patient No       Level of care: Med-Surg  Consultants:  None  Procedures:  None  Antimicrobials:  Ceftriaxone Azithromycin   Subjective: Seen and examined.  Resting comfortably in bed.  No visible distress.  Answers questions appropriately.  No pain complaints.  Objective: Vitals:   04/29/21 0727 04/29/21 0754 04/29/21 0928 04/29/21 1326  BP: (!) 146/116  (!) 139/54 (!) 117/53  Pulse: (!) 102  75 71  Resp: '15  16 16  '$ Temp: 98.5 F (36.9 C)  98.1 F (36.7 C) 98.8 F (37.1 C)  TempSrc:   Oral Oral  SpO2: 95% 91% 97% 100%  Weight:      Height:        Intake/Output Summary (Last 24 hours) at 04/29/2021 1353 Last data filed at 04/29/2021 0505 Gross per 24 hour  Intake 240 ml  Output  100 ml  Net 140 ml   Filed Weights   04/28/21 1034  Weight: 76.2 kg    Examination:  General exam: Appears calm and comfortable  Respiratory system: Scattered rhonchi bilaterally, worse in upper lobes, normal work of breathing Cardiovascular system: S1-S2, regular rate and rhythm, no murmurs, no lower extremity edema  gastrointestinal system: Thin, nontender, nondistended, normal bowel  sounds Central nervous system: Alert, oriented x2, no focal deficits Extremities: Decreased power left, left knee swollen, decreased range of motion Skin: No rashes, lesions or ulcers Psychiatry: Judgement and insight appear normal. Mood & affect appropriate.     Data Reviewed: I have personally reviewed following labs and imaging studies  CBC: Recent Labs  Lab 04/28/21 1049 04/29/21 0409  WBC 10.3 7.6  HGB 11.6* 9.8*  HCT 35.6* 30.2*  MCV 97.8 96.2  PLT 290 [id number]   Basic Metabolic Panel: Recent Labs  Lab 04/28/21 1049 04/29/21 0409  NA 140 138  K 4.3 4.2  CL 108 106  CO2 27 26  GLUCOSE 101* 92  BUN 19 13  CREATININE 0.84 0.62  CALCIUM 9.4 8.6*   GFR: Estimated Creatinine Clearance: 50.6 mL/min (by C-G formula based on SCr of 0.62 mg/dL). Liver Function Tests: No results for input(s): AST, ALT, ALKPHOS, BILITOT, PROT, ALBUMIN in the last 168 hours. No results for input(s): LIPASE, AMYLASE in the last 168 hours. No results for input(s): AMMONIA in the last 168 hours. Coagulation Profile: No results for input(s): INR, PROTIME in the last 168 hours. Cardiac Enzymes: No results for input(s): CKTOTAL, CKMB, CKMBINDEX, TROPONINI in the last 168 hours. BNP (last 3 results) No results for input(s): PROBNP in the last 8760 hours. HbA1C: No results for input(s): HGBA1C in the last 72 hours. CBG: No results for input(s): GLUCAP in the last 168 hours. Lipid Profile: No results for input(s): CHOL, HDL, LDLCALC, TRIG, CHOLHDL, LDLDIRECT in the last 72 hours. Thyroid Function Tests: No results for input(s): TSH, T4TOTAL, FREET4, T3FREE, THYROIDAB in the last 72 hours. Anemia Panel: No results for input(s): VITAMINB12, FOLATE, FERRITIN, TIBC, IRON, RETICCTPCT in the last 72 hours. Sepsis Labs: Recent Labs  Lab 04/28/21 1229  LATICACIDVEN 1.8    Recent Results (from the past 240 hour(s))  Resp Panel by RT-PCR (Flu A&B, Covid) Nasopharyngeal Swab     Status: None    Collection Time: 04/28/21  6:36 PM   Specimen: Nasopharyngeal Swab; Nasopharyngeal(NP) swabs in vial transport medium  Result Value Ref Range Status   SARS Coronavirus 2 by RT PCR NEGATIVE NEGATIVE Final    Comment: (NOTE) SARS-CoV-2 target nucleic acids are NOT DETECTED.  The SARS-CoV-2 RNA is generally detectable in upper respiratory specimens during the acute phase of infection. The lowest concentration of SARS-CoV-2 viral copies this assay can detect is 138 copies/mL. A negative result does not preclude SARS-Cov-2 infection and should not be used as the sole basis for treatment or other patient management decisions. A negative result may occur with  improper specimen collection/handling, submission of specimen other than nasopharyngeal swab, presence of viral mutation(s) within the areas targeted by this assay, and inadequate number of viral copies(<138 copies/mL). A negative result must be combined with clinical observations, patient history, and epidemiological information. The expected result is Negative.  Fact Sheet for Patients:  EntrepreneurPulse.com.au  Fact Sheet for Healthcare Providers:  IncredibleEmployment.be  This test is no t yet approved or cleared by the [place] FDA and  has been authorized for detection and/or diagnosis of SARS-CoV-2 by  FDA under an Emergency Use Authorization (EUA). This EUA will remain  in effect (meaning this test can be used) for the duration of the COVID-19 declaration under Section 564(b)(1) of the Act, 21 U.S.C.section 360bbb-3(b)(1), unless the authorization is terminated  or revoked sooner.       Influenza A by PCR NEGATIVE NEGATIVE Final   Influenza B by PCR NEGATIVE NEGATIVE Final    Comment: (NOTE) The Xpert Xpress SARS-CoV-2/FLU/RSV plus assay is intended as an aid in the diagnosis of influenza from Nasopharyngeal swab specimens and should not be used as a sole basis for treatment.  Nasal washings and aspirates are unacceptable for Xpert Xpress SARS-CoV-2/FLU/RSV testing.  Fact Sheet for Patients: EntrepreneurPulse.com.au  Fact Sheet for Healthcare Providers: IncredibleEmployment.be  This test is not yet approved or cleared by the [place] FDA and has been authorized for detection and/or diagnosis of SARS-CoV-2 by FDA under an Emergency Use Authorization (EUA). This EUA will remain in effect (meaning this test can be used) for the duration of the COVID-19 declaration under Section 564(b)(1) of the Act, 21 U.S.C. section 360bbb-3(b)(1), unless the authorization is terminated or revoked.  Performed at [facility], [address] [place], [place] [address]   MRSA Next Gen by PCR, Nasal     Status: None   Collection Time: 04/29/21  5:39 AM   Specimen: Nasal Mucosa; Nasal Swab  Result Value Ref Range Status   MRSA by PCR Next Gen NOT DETECTED NOT DETECTED Final    Comment: (NOTE) The GeneXpert MRSA Assay (FDA approved for NASAL specimens only), is one component of a comprehensive MRSA colonization surveillance program. It is not intended to diagnose MRSA infection nor to guide or monitor treatment for MRSA infections. Test performance is not FDA approved in patients less than 15 years old. Performed at [facility], [address], [place],  [address]          Radiology Studies: DG Chest 2 View  Result Date: 04/28/2021 CLINICAL DATA:  Dyspnea, hypoxia, fall EXAM: CHEST - 2 VIEW COMPARISON:  07/21/2018 chest radiograph. FINDINGS: Stable cardiomediastinal silhouette with normal heart size. No pneumothorax. No pleural effusion. Patchy left lung base consolidation is new. Clear right lung. No displaced fractures in the visualized chest. IMPRESSION: New patchy left lung base consolidation, suggesting pneumonia. Chest radiograph follow-up advised. Electronically Signed   By: Ilona Sorrel M.D.    On: 04/28/2021 11:26   DG Wrist Complete Left  Result Date: 04/28/2021 CLINICAL DATA:  Left wrist pain after fall. EXAM: LEFT WRIST - COMPLETE 3+ VIEW COMPARISON:  None. FINDINGS: There is no evidence of fracture or dislocation. Severe degenerative changes seen involving the first carpometacarpal joint. Chondrocalcinosis is seen involving the triangular fibrocartilage. Soft tissues are unremarkable. IMPRESSION: Severe osteoarthritis of first carpometacarpal joint. No acute abnormality seen. Electronically Signed   By: Marijo Conception M.D.   On: 04/28/2021 13:48   CT Head Wo Contrast  Result Date: 04/28/2021 CLINICAL DATA:  Fall.  Hypoxia. EXAM: CT HEAD WITHOUT CONTRAST TECHNIQUE: Contiguous axial images were obtained from the base of the skull through the vertex without intravenous contrast. COMPARISON:  11/27/2013 FINDINGS: Brain: No evidence of acute infarction, hemorrhage, hydrocephalus, extra-axial collection or mass lesion/mass effect. Mild ventricular and sulcal enlargement reflects age appropriate volume loss. There is mild periventricular white matter hypoattenuation consistent with chronic microvascular ischemic change. Vascular: No hyperdense vessel or unexpected calcification. Skull: Normal. Negative for fracture or focal lesion. Sinuses/Orbits: Globes and orbits are unremarkable.  Visualized sinuses are clear. Other: None. IMPRESSION: 1. No acute intracranial abnormalities. 2. Age-appropriate volume loss. Mild chronic microvascular ischemic change. Electronically Signed   By: Lajean Manes M.D.   On: 04/28/2021 15:58   DG Shoulder Left  Result Date: 04/28/2021 CLINICAL DATA:  Left shoulder pain after fall. EXAM: LEFT SHOULDER - 2+ VIEW COMPARISON:  None. FINDINGS: There is no evidence of fracture or dislocation. Severe degenerative changes seen involving the left glenohumeral joint. Soft tissues are unremarkable. IMPRESSION: Severe degenerative joint disease of the left glenohumeral joint. No  acute abnormality seen. Electronically Signed   By: Marijo Conception M.D.   On: 04/28/2021 13:46   DG Knee Complete 4 Views Left  Result Date: 04/28/2021 CLINICAL DATA:  Left knee pain after fall. EXAM: LEFT KNEE - COMPLETE 4+ VIEW COMPARISON:  July 21, 2018. FINDINGS: No definite fracture or dislocation is noted. Small suprapatellar joint effusion is noted. Severe degenerative changes seen medially and laterally with osteophyte formation. Status post intramedullary rod fixation of right femur. IMPRESSION: Small suprapatellar joint effusion. Severe degenerative joint disease. No fracture or dislocation is noted. Electronically Signed   By: Marijo Conception M.D.   On: 04/28/2021 13:44        Scheduled Meds:  amLODipine  10 mg Oral Daily   baclofen  5 mg Oral BID   cholecalciferol  1,000 Units Oral Daily   citalopram  20 mg Oral Daily   diclofenac Sodium  4 g Topical TID   divalproex  125 mg Oral 2 times per day   divalproex  250 mg Oral QHS   enalapril  10 mg Oral BID   enoxaparin (LOVENOX) injection  40 mg Subcutaneous Q24H   fluticasone  2 spray Each Nare Daily   fluticasone furoate-vilanterol  1 puff Inhalation Daily   guaiFENesin  600 mg Oral BID   ipratropium-albuterol  3 mL Nebulization Q6H   melatonin  10 mg Oral QHS   mirtazapine  7.5 mg Oral QHS   nicotine  21 mg Transdermal Daily   pantoprazole  40 mg Oral Daily   polyethylene glycol  17 g Oral Daily   polyvinyl alcohol  1 drop Both Eyes BID   senna-docusate  1 tablet Oral BID   tiotropium  18 mcg Inhalation Daily   traZODone  150 mg Oral QHS   Continuous Infusions:  azithromycin Stopped (04/28/21 2056)   cefTRIAXone (ROCEPHIN)  IV Stopped (04/28/21 1536)   [START ON 04/30/2021] cefTRIAXone (ROCEPHIN)  IV       LOS: 1 day    Time spent: 35 minutes    Sidney Ace, MD [facility] Pager [phone]  If 7PM-7AM, please contact night-coverage 04/29/2021, 1:53 PM

## 2021-04-29 NOTE — Progress Notes (Signed)
Cross coverage note  Telemetry with possible A. fib

## 2021-04-29 NOTE — Plan of Care (Signed)
Patient admitted this shift. Bed low and locked. Call bell within reach. No complaints of pain.  Problem: Clinical Measurements: Goal: Ability to maintain clinical measurements within normal limits will improve Outcome: Progressing Goal: Will remain free from infection Outcome: Progressing Goal: Diagnostic test results will improve Outcome: Progressing Goal: Respiratory complications will improve Outcome: Progressing Goal: Cardiovascular complication will be avoided Outcome: Progressing   Problem: Activity: Goal: Risk for activity intolerance will decrease Outcome: Progressing   Problem: Nutrition: Goal: Adequate nutrition will be maintained Outcome: Progressing   Problem: Coping: Goal: Level of anxiety will decrease Outcome: Progressing   Problem: Elimination: Goal: Will not experience complications related to bowel motility Outcome: Progressing Goal: Will not experience complications related to urinary retention Outcome: Progressing   Problem: Pain Managment: Goal: General experience of comfort will improve Outcome: Progressing   Problem: Safety: Goal: Ability to remain free from injury will improve Outcome: Progressing   Problem: Skin Integrity: Goal: Risk for impaired skin integrity will decrease Outcome: Progressing   Problem: Urinary Elimination: Goal: Signs and symptoms of infection will decrease Outcome: Progressing

## 2021-04-29 NOTE — TOC Progression Note (Signed)
Transition of Care [facility]) - Progression Note    Patient Details  Name: Kari Mcdonald MRN: [id number] Date of Birth: 29-Dec-1931  [facility]) CM/SW [place], RN Phone Number: 04/29/2021, 1:57 PM  Clinical Narrative:   TOC spoke with patient's daughter, who stated that patient and daughter plan for her to return to [facility] upon discharge.  As per Audry Pili at [facility], patient is eligilble to return to facility at discharge.  TOC contact information given to daughter, TOC to follow to discharge.         Expected Discharge Plan and Services                                                 Social Determinants of Health (SDOH) Interventions    Readmission Risk Interventions No flowsheet data found.

## 2021-04-29 NOTE — Progress Notes (Signed)
Initial Nutrition Assessment  DOCUMENTATION CODES:  Severe malnutrition in context of chronic illness, Underweight  INTERVENTION:  Liberalize diet to regular for poor PO intake and severe malnutrition Request new measured weight Ensure Enlive po BID, each supplement provides 350 kcal and 20 grams of protein  NUTRITION DIAGNOSIS:  Severe Malnutrition (in the context of chronic illness) related to poor appetite as evidenced by severe fat depletion, severe muscle depletion.  GOAL:  Patient will meet greater than or equal to 90% of their needs  MONITOR:  PO intake, Supplement acceptance, Labs, Weight trends  REASON FOR ASSESSMENT:  Malnutrition Screening Tool    ASSESSMENT:  87 y.o. female with PMH of COPD on 2L O2 at night, HTN, HLD, tobacco dependence (2 packs/day), osteoporosis, GERD, depression, history of MI, diverticulosis, CAD presented to ED after a fall at her LTC facility.  Found to have a UTI, increased confusion from baseline present.   Pt resting in bed at the time of visit. Son visiting at bedside reports that he does not think appetite has been very substantial. Pt reports it has been "ok." Does endorse she is able to breathe easier today.   Significant fat and muscle depletions present on exam. Pt agreeable to ensure supplements - states she has not had them before. Prefers chocolate, does not like strawberry. Denies GI distress or difficulty chewing.   Weight in chart is incorrect based on visual exam, but unable to obtain bed weight. Requested new weight to assess for changes.    Average Meal Intake: 7/24-7/25: 50% intake x 1 recorded meal  Nutritionally Relevant Medications: Scheduled Meds:  baclofen  5 mg Oral BID   cholecalciferol  1,000 Units Oral Daily   citalopram  20 mg Oral Daily   mirtazapine  7.5 mg Oral QHS   nicotine  21 mg Transdermal Daily   pantoprazole  40 mg Oral Daily   polyethylene glycol  17 g Oral Daily   senna-docusate  1 tablet Oral  BID   PRN Meds: bisacodyl, ondansetron, polyethylene glycol, sodium phosphate  Labs Reviewed  NUTRITION - FOCUSED PHYSICAL EXAM: Flowsheet Row Most Recent Value  Orbital Region Severe depletion  Upper Arm Region Moderate depletion  Thoracic and Lumbar Region Moderate depletion  Buccal Region Severe depletion  Temple Region Moderate depletion  Clavicle Bone Region Severe depletion  Clavicle and Acromion Bone Region Severe depletion  Scapular Bone Region Severe depletion  Dorsal Hand Severe depletion  Patellar Region Severe depletion  Anterior Thigh Region Severe depletion  Posterior Calf Region Severe depletion  Edema (RD Assessment) None  Hair Reviewed  Eyes Reviewed  Mouth Reviewed  Skin Reviewed  [scattered bruising and skin tears]  Nails Reviewed   Diet Order:   Diet Order             Diet regular Room service appropriate? Yes with Assist; Fluid consistency: Thin  Diet effective now                   EDUCATION NEEDS:  No education needs have been identified at this time  Skin:  Skin Assessment: Reviewed RN Assessment  Last BM:  PTA  Height:  Ht Readings from Last 1 Encounters:  04/28/21 '5\' 5"'$  (1.651 m)    Weight:  Wt Readings from Last 1 Encounters:  04/29/21 47.2 kg    Ideal Body Weight:  56.8 kg  BMI:  Body mass index is 17.32 kg/m.  Estimated Nutritional Needs:  Kcal:  1400-1600 kcal/d Protein:  70-80 g/d  Fluid:  >1562m/d   RRanell Patrick RD, LDN Clinical Dietitian Pager on [facility]

## 2021-04-30 ENCOUNTER — Inpatient Hospital Stay
Admit: 2021-04-30 | Discharge: 2021-04-30 | Disposition: A | Discharge status: Home / Self Care | Payer: Medicare Other | Attending: Internal Medicine | Admitting: Internal Medicine

## 2021-04-30 DIAGNOSIS — J189 Pneumonia, unspecified organism: Secondary | ICD-10-CM | POA: Diagnosis not present

## 2021-04-30 DIAGNOSIS — E43 Unspecified severe protein-calorie malnutrition: Secondary | ICD-10-CM | POA: Insufficient documentation

## 2021-04-30 LAB — ECHOCARDIOGRAM COMPLETE
AR max vel: 2.65 cm2
AV Area VTI: 3.35 cm2
AV Area mean vel: 3.03 cm2
AV Mean grad: 5 mmHg
AV Peak grad: 8.5 mmHg
Ao pk vel: 1.46 m/s
Area-P 1/2: 2.34 cm2
Height: 65 in
S' Lateral: 1.96 cm
Weight: 1664.91 oz

## 2021-04-30 LAB — LEGIONELLA PNEUMOPHILA SEROGP 1 UR AG: L. pneumophila Serogp 1 Ur Ag: NEGATIVE

## 2021-04-30 MED ORDER — GUAIFENESIN ER 600 MG PO TB12: 600.0000 mg | ORAL_TABLET | Freq: Two times a day (BID) | ORAL | Status: DC

## 2021-04-30 NOTE — Evaluation (Signed)
Occupational Therapy Evaluation Patient Details Name: Kari Mcdonald MRN: [id number] DOB: 10/27/1931 Today's Date: 04/30/2021    History of Present Illness Kari Mcdonald is a 87 y.o. female who presents with CAP and c/o of fall with LUE pain. Imaging negative for acute abnormality. Past medical history of COPD with chronic hypoxic respiratory failure on 2 L at night, hypertension, tobacco dependence currently smoking 2 packs/day, osteoporosis, GERD, depression, history of myocardial infarction, ambulatory dysfunction - primarily uses WC for mobility at baseline.   Clinical Impression   Kari Mcdonald was seen for OT evaluation this date. Prior to hospital admission, pt was living in long-term care at a skilled nursing facility. Pt presents very HOH, but is able to answer A&O questions appropriately. Her daughter at bedside reports, pt generally uses a WC for functional mobility, and requires assistance from facility staff for bathing. Pt states she is able to dress herself and SPT to her WC independently.  Currently pt demonstrates impairments as described below (See OT problem list) which functionally limit her ability to perform ADL/self-care tasks. Pt currently requires supervision for safety, but becomes SOB and lethargic while sitting EOB. HR noted to reach 170's per tele monitor and SO2 drops to 71% with pt on 2L [place]. Pt returned to supine and [place] increased to 3 LPM. SO2 rebounds to 90's but HR remains variable between 80's and 160's. MD notified via secure chat. RN enters room during session and made aware of pt status.Pt unable to engage in further functional activity. Anticipate MIN-MOD A for LB ADL management in seated position. Pt would benefit from skilled OT services to address noted impairments and functional limitations (see below for any additional details) in order to maximize safety and independence while minimizing falls risk and caregiver burden. Upon hospital discharge, recommend  STR to maximize pt safety and return to PLOF.       Follow Up Recommendations  SNF    Equipment Recommendations  None recommended by OT    Recommendations for Other Services       Precautions / Restrictions Precautions Precautions: Fall Restrictions Weight Bearing Restrictions: No      Mobility Bed Mobility Overal bed mobility: Needs Assistance Bed Mobility: Supine to Sit     Supine to sit: Supervision;HOB elevated     General bed mobility comments: Increased time/effort to perform    Transfers                 General transfer comment: Deferred for pt safety.    Balance Overall balance assessment: Needs assistance Sitting-balance support: Bilateral upper extremity supported;Feet supported Sitting balance-Leahy Scale: Fair Sitting balance - Comments: Unable to sit without BUE support.       Standing balance comment: Not tested                           ADL either performed or assessed with clinical judgement   ADL Overall ADL's : Needs assistance/impaired                                       General ADL Comments: Pt is functionally limited by cardiopulmonary status, generalized weakness, and decreased activity tolerance. She performs bed mobility with supervision for safety, but becomes SOB and lethargic while sitting EOB. HR noted to reach 170's per tele monitor and SO2 drops to 71% with pt on 2L [place].  Pt returned to supine and [place] increased to 3 LPM. SO2 rebounds to 90's but HR remains variable between 80's and 160's. MD notified via secure chat. RN enters room during session and made aware of pt status.Pt unable to engage in further functional activity. Anticipate MIN-MOD A for LB ADL management in seated position.     Vision Baseline Vision/History: Wears glasses Wears Glasses: At all times Patient Visual Report: Diplopia;Blurring of vision Additional Comments: Pt reports month-long hx of double and blurry vision.      Perception     Praxis      Pertinent Vitals/Pain Pain Assessment: No/denies pain     Hand Dominance Right   Extremity/Trunk Assessment Upper Extremity Assessment Upper Extremity Assessment: Generalized weakness   Lower Extremity Assessment Lower Extremity Assessment: Generalized weakness   Cervical / Trunk Assessment Cervical / Trunk Assessment: Kyphotic   Communication Communication Communication: HOH   Cognition Arousal/Alertness: Awake/alert Behavior During Therapy: WFL for tasks assessed/performed Overall Cognitive Status: Difficult to assess                                 General Comments: Pt daughter at bedside states pt cognition is much improved since being in the ED. She is able to answer all A&O questions. She does have some difficulty following VCs suspect this is 2/2 impaired hearing.   General Comments       Exercises Other Exercises Other Exercises: Pt/caregiver (daughter at bedside during session) educated on role of OT in acute setting, incentive spirometer use, falls prevention strategies, and routines modifications to support safety and functional independence upon hospital DC.   Shoulder Instructions      Home Living Family/patient expects to be discharged to:: Skilled nursing facility                                 Additional Comments: Pt has lived in LTC side of SNF for last year. Per daughter at bedside does try to stay as independent as possible.      Prior Functioning/Environment Level of Independence: Needs assistance        Comments: reports she stays as independent as possible. Staff at [facility] assist with bathing. She is able to dress herself and transfers to her WC independently. She is able to propel WC without assist at baseline. Reports multiple falls getting up from commode at SNF.        OT Problem List: Decreased strength;Impaired balance (sitting and/or standing);Decreased range of motion;Decreased  safety awareness;Cardiopulmonary status limiting activity;Decreased activity tolerance;Decreased coordination;Decreased knowledge of use of DME or AE      OT Treatment/Interventions: Self-care/ADL training;Therapeutic exercise;DME and/or AE instruction;Energy conservation;Therapeutic activities;Patient/family education;Balance training    OT Goals(Current goals can be found in the care plan section) Acute Rehab OT Goals Patient Stated Goal: To be able to play bingo again. OT Goal Formulation: With patient Time For Goal Achievement: 05/14/21 Potential to Achieve Goals: Good ADL Goals Pt Will Perform Grooming: sitting;with set-up;with supervision Pt Will Perform Lower Body Dressing: sitting/lateral leans;with min assist;with adaptive equipment Pt Will Transfer to Toilet: stand pivot transfer;bedside commode;with min guard assist  OT Frequency: Min 1X/week   Barriers to D/C:            Co-evaluation              AM-PAC OT "6 Clicks" Daily Activity  Outcome Measure Help from another person eating meals?: A Little Help from another person taking care of personal grooming?: A Little Help from another person toileting, which includes using toliet, bedpan, or urinal?: A Lot Help from another person bathing (including washing, rinsing, drying)?: A Lot Help from another person to put on and taking off regular upper body clothing?: A Little Help from another person to put on and taking off regular lower body clothing?: A Lot 6 Click Score: 15   End of Session Equipment Utilized During Treatment: Oxygen Nurse Communication: Other (comment) (Pt vitals during session.)  Activity Tolerance: Treatment limited secondary to medical complications (Comment) (cardiopulmonary status. MD Notified) Patient left: in bed;with call bell/phone within reach;with bed alarm set;with family/visitor present;with nursing/sitter in room  OT Visit Diagnosis: Other abnormalities of gait and mobility  (R26.89);Repeated falls (R29.6)                Time: [id number] OT Time Calculation (min): 50 min Charges:  OT General Charges $OT Visit: 1 Visit OT Eval Moderate  Shara Blazing, M.S., OTR/L Ascom: [phone] 04/30/21, 1:22 PM

## 2021-04-30 NOTE — Evaluation (Signed)
Physical Therapy Evaluation Patient Details Name: Kari Mcdonald MRN: [id number] DOB: 19-Aug-1932 Today's Date: 04/30/2021   History of Present Illness  Kari Mcdonald is a 65yoF who presents 7/24 from [facility] after a fall and SOB. Pt admitted with CAP, pain in LUE after fall. Pt also has swollen Lt knee. PMH: COPD, CRF on 2L at night, HTN, tobacco use, osteoporosis, GERD, depression, MI, Left hip fracture s/p IM nail ORIF 2018, AMB impairment with WC for mobility needs, can typically mobilize outside for smoking of tobacco.  Clinical Impression  Pt admitted with above diagnosis. Pt currently with functional limitations due to the deficits listed below (see "PT Problem List"). Upon entry, pt in bed, awake and agreeable to participate. The pt is alert, pleasant, interactive, and able to provide info regarding prior level of function, both in tolerance and independence. DTR is at bedside. Pt reports still working on BM during session, agreeable to move to [facility] where the passageway of bowel can be made less serpentine. No assist needed to EOB, pt stands with HHA minGuard, but has bowel loss during pivot to [facility]. Pt had no significant additional BM once seated. Pt stands and balances with RW for 90sec, good tolerance subjectively, but remains in AF c RVR, rates fluctuating as high as 150s a few times. Pt stand pivots to recliner thereafter where she sits at exit. Pt has hypoxia at 82% after transfer to recliner/standing pericare, author bumps O2 to 4L to minimize recovery time and cardiac stress. Moved to 3L at exit (on 2L at entry with sats between 89-91%). Patient's performance this date reveals decreased ability, independence, and tolerance in performing all basic mobility required for performance of activities of daily living. Pt requires additional DME, close physical assistance, and cues for safe participate in mobility. Pt will benefit from skilled PT intervention to increase independence and  safety with basic mobility in preparation for discharge to the venue listed below.       Follow Up Recommendations Supervision - Intermittent (Rehab needs can be evlauted more in depth at facility; appears to be at baseline with exception to new RVR.)    Equipment Recommendations  None recommended by PT    Recommendations for Other Services       Precautions / Restrictions Precautions Precautions: Fall Restrictions Weight Bearing Restrictions: No      Mobility  Bed Mobility Overal bed mobility: Modified Independent Bed Mobility: Supine to Sit     Supine to sit: Supervision          Transfers Overall transfer level: Needs assistance Equipment used: Rolling walker (2 wheeled) Transfers: Sit to/from [facility] Sit to Stand: Min guard Stand pivot transfers: Min guard       General transfer comment: safe, no impulsivity;  Ambulation/Gait   Gait Distance (Feet):  (deferred; does not AMB significant distances at baseline.)            Stairs            Wheelchair Mobility    Modified Rankin (Stroke Patients Only)       Balance Overall balance assessment: History of Falls;Mild deficits observed, not formally tested;Needs assistance         Standing balance support: Bilateral upper extremity supported;During functional activity Standing balance-Leahy Scale: Poor                               Pertinent Vitals/Pain Pain Assessment: No/denies pain  Home Living Family/patient expects to be discharged to:: Skilled nursing facility                 Additional Comments: Pt has lived in LTC side of SNF for last 4 years. WC for most mobility, but has been having more falls recently, mostly withj toilet transfers.    Prior Function Level of Independence: Needs assistance   Gait / Transfers Assistance Needed: WC for mobility, self propels in facility, can do stand pivot transfers ad lib, typically without  RW  ADL's / Homemaking Assistance Needed: Can dress self, wipe down, gets assistance from staff for bathing.        Hand Dominance   Dominant Hand: Right    Extremity/Trunk Assessment   Upper Extremity Assessment Upper Extremity Assessment: Overall WFL for tasks assessed;Generalized weakness    Lower Extremity Assessment Lower Extremity Assessment: Overall WFL for tasks assessed;Generalized weakness    Cervical / Trunk Assessment Cervical / Trunk Assessment: Kyphotic  Communication   Communication: HOH  Cognition Arousal/Alertness: Awake/alert Behavior During Therapy: WFL for tasks assessed/performed Overall Cognitive Status: Within Functional Limits for tasks assessed                                 General Comments: able to provide PLOF, no correction from DTR      General Comments      Exercises     Assessment/Plan    PT Assessment Patient needs continued PT services  PT Problem List Cardiopulmonary status limiting activity;Decreased activity tolerance;Decreased balance;Decreased mobility       PT Treatment Interventions DME instruction;Balance training;Neuromuscular re-education;Cognitive remediation;Functional mobility training;Patient/family education;Therapeutic activities;Wheelchair mobility training;Therapeutic exercise    PT Goals (Current goals can be found in the Care Plan section)  Acute Rehab PT Goals Patient Stated Goal: To be able to play bingo again. PT Goal Formulation: With patient Time For Goal Achievement: 05/14/21 Potential to Achieve Goals: Good    Frequency Min 2X/week   Barriers to discharge        Co-evaluation               AM-PAC PT "6 Clicks" Mobility  Outcome Measure Help needed turning from your back to your side while in a flat bed without using bedrails?: A Little Help needed moving from lying on your back to sitting on the side of a flat bed without using bedrails?: A Little Help needed moving to  and from a bed to a chair (including a wheelchair)?: A Little Help needed standing up from a chair using your arms (e.g., wheelchair or bedside chair)?: A Little Help needed to walk in hospital room?: A Lot Help needed climbing 3-5 steps with a railing? : Total 6 Click Score: 15    End of Session Equipment Utilized During Treatment: Oxygen Activity Tolerance: Patient tolerated treatment well;No increased pain;Patient limited by fatigue;Treatment limited secondary to medical complications (Comment) Patient left: in chair;with family/visitor present;with call bell/phone within reach Nurse Communication: Mobility status PT Visit Diagnosis: Other abnormalities of gait and mobility (R26.89)    Time: 1422-1500 PT Time Calculation (min) (ACUTE ONLY): 38 min   Charges:   PT Evaluation $PT Eval Moderate Complexity: 1 Mod PT Treatments $Therapeutic Activity: 23-37 mins       3:21 PM, 04/30/21 Etta Grandchild, PT, DPT Physical Therapist - [facility]  [phone] ([place])    [place] C 04/30/2021, 3:16 PM

## 2021-04-30 NOTE — Progress Notes (Signed)
PROGRESS NOTE    Kari Mcdonald  [id number] DOB: [birth year] DOA: 04/28/2021 PCP: Sofie Hartigan, MD   Brief Narrative: 87 y.o. female with a past medical history of COPD with chronic hypoxic respiratory failure on 2 L at night, hypertension, tobacco dependence currently smoking 2 packs/day, osteoporosis, GERD, depression, history of myocardial infarction, ambulatory dysfunction mostly wheelchair-bound.   She presents from [facility] where she is a long-term resident.  She had a fall at the nursing home on Thursday and Friday of last week but was not sent to the ER for this.  Weekend staff reported that she was complaining of left shoulder, left wrist pain for the past 2 days.  Her left knee is more swollen than usual.  The patient is typically able to transfer from wheelchair to bed/toilet without assistance and is usually able to wheel herself outside to smoke cigarettes.  She has not been to do that this weekend.   Patient had evidence of a urinary tract infection with noted bacteria on urinalysis.  She also has evidence of community-acquired pneumonia with hazy infiltrate on chest x-ray.  Started on community-acquired pneumonia treatment.  All cultures no growth to date.  Also possibly new onset atrial fibrillation.  Unable to capture on twelve-lead EKG.  Usually precipitated by some form of exertion.  On 7/26 therapy noted patient reverted in atrial fibrillation for a brief period of time associated with a drop in oxygen saturation with the minimal exertion.  Assessment & Plan:   Active Problems:   CAP (community acquired pneumonia)   Protein-calorie malnutrition, severe  Acute on chronic hypoxic respiratory failure Community-acquired bacterial pneumonia Patient meets hypoxic respiratory failure criteria with 84% saturation on room air She does use oxygen but only at night Plan: Continue Rocephin and azithromycin 5-day stop date on antibiotics Continue oxygen,  wean as tolerated Goal saturation 88-92% Monitor vitals and fever curve Supplemental inhalers Therapy as tolerated Incentive spirometry and flutter Twice daily guaifenesin  Urinary tract infection Patient with evidence of a UTI on urinalysis Urine culture with E. coli > 100,000 CFU On Rocephin for CAP, this will cover urinary pathogens Monitor cultures  Possible new onset atrial fibrillation Precipitated by acute infection and exertion Unable to capture on twelve-lead EKG Plan: Continue telemetry Twelve-lead EKG for tachycardia Check 2D echocardiogram  Acute metabolic encephalopathy Likely secondary to above infections Mental status appears to be improving Continue treating infections Delirium precautions  Mechanical fall No acute fractures or dislocations Pain control as needed Supportive care Therapy evaluations  Essential hypertension Norvasc 10 daily Vasotec 10 daily  Depression Insomnia PTA Celexa PTA Remeron and thjhjrazodone  GERD PPI  Tobacco dependence Smokes 2 packs daily Nicotine patch ordered  COPD No evidence of acute exacerbation    DVT prophylaxis: SQ Lovenox Code Status: Full Family Communication: Daughter at bedside 7/26 Disposition Plan: Status is: Inpatient  Remains inpatient appropriate because:Inpatient level of care appropriate due to severity of illness  Dispo: The patient is from: SNF              Anticipated d/c is to: SNF              Patient currently is not medically stable to d/c.   Difficult to place patient No  Possible discharge 7/27.  Pending echocardiogram and culture and sensitivities     Level of care: Med-Surg  Consultants:  None  Procedures:  None  Antimicrobials:  Ceftriaxone Azithromycin   Subjective: Patient seen and examined.  Resting in bed.  No visible distress.  Answers questions appropriately.  No pain complaints  Objective: Vitals:   04/30/21 0136 04/30/21 0207 04/30/21 0723  04/30/21 0759  BP: 125/63  (!) 147/70   Pulse: 87  73   Resp:   15   Temp: 97.8 F (36.6 C)  97.8 F (36.6 C)   TempSrc: Axillary     SpO2: 98% 98% 99% 95%  Weight:      Height:        Intake/Output Summary (Last 24 hours) at 04/30/2021 1126 Last data filed at 04/30/2021 0500 Gross per 24 hour  Intake 360 ml  Output 800 ml  Net -440 ml   Filed Weights   04/28/21 1034 04/29/21 1650  Weight: 76.2 kg 47.2 kg    Examination:  General exam: Appears calm and comfortable  Respiratory system: Coarse rhonchi bilaterally.  Normal work of breathing.  2 L cardiovascular system: S1-S2, regular rate and rhythm, no murmurs, no lower extremity edema  gastrointestinal system: Thin, nontender, nondistended, normal bowel sounds Central nervous system: Alert, oriented x2, no focal deficits Extremities: Decreased power left, left knee swollen, decreased range of motion Skin: No rashes, lesions or ulcers Psychiatry: Judgement and insight appear normal. Mood & affect appropriate.     Data Reviewed: I have personally reviewed following labs and imaging studies  CBC: Recent Labs  Lab 04/28/21 1049 04/29/21 0409  WBC 10.3 7.6  HGB 11.6* 9.8*  HCT 35.6* 30.2*  MCV 97.8 96.2  PLT 290 [id number]   Basic Metabolic Panel: Recent Labs  Lab 04/28/21 1049 04/29/21 0409  NA 140 138  K 4.3 4.2  CL 108 106  CO2 27 26  GLUCOSE 101* 92  BUN 19 13  CREATININE 0.84 0.62  CALCIUM 9.4 8.6*   GFR: Estimated Creatinine Clearance: 36.9 mL/min (by C-G formula based on SCr of 0.62 mg/dL). Liver Function Tests: No results for input(s): AST, ALT, ALKPHOS, BILITOT, PROT, ALBUMIN in the last 168 hours. No results for input(s): LIPASE, AMYLASE in the last 168 hours. No results for input(s): AMMONIA in the last 168 hours. Coagulation Profile: No results for input(s): INR, PROTIME in the last 168 hours. Cardiac Enzymes: No results for input(s): CKTOTAL, CKMB, CKMBINDEX, TROPONINI in the last 168 hours. BNP  (last 3 results) No results for input(s): PROBNP in the last 8760 hours. HbA1C: No results for input(s): HGBA1C in the last 72 hours. CBG: No results for input(s): GLUCAP in the last 168 hours. Lipid Profile: No results for input(s): CHOL, HDL, LDLCALC, TRIG, CHOLHDL, LDLDIRECT in the last 72 hours. Thyroid Function Tests: No results for input(s): TSH, T4TOTAL, FREET4, T3FREE, THYROIDAB in the last 72 hours. Anemia Panel: No results for input(s): VITAMINB12, FOLATE, FERRITIN, TIBC, IRON, RETICCTPCT in the last 72 hours. Sepsis Labs: Recent Labs  Lab 04/28/21 1229  LATICACIDVEN 1.8    Recent Results (from the past 240 hour(s))  Urine Culture     Status: Abnormal (Preliminary result)   Collection Time: 04/28/21 12:30 PM   Specimen: Urine, Random  Result Value Ref Range Status   Specimen Description   Final    URINE, RANDOM Performed at [facility], [address], [place], [place] [address]    Special Requests   Final    NONE Performed at [facility], [address], [place], [place] [address]    Culture (A)  Final    >=100,000 COLONIES/mL ESCHERICHIA COLI SUSCEPTIBILITIES TO FOLLOW Performed at [facility], [address] [place], [place] [address]    Report Status PENDING  Incomplete  Resp Panel by RT-PCR (Flu A&B, Covid) Nasopharyngeal Swab     Status: None   Collection Time: 04/28/21  6:36 PM   Specimen: Nasopharyngeal Swab; Nasopharyngeal(NP) swabs in vial transport medium  Result Value Ref Range Status   SARS Coronavirus 2 by RT PCR NEGATIVE NEGATIVE Final    Comment: (NOTE) SARS-CoV-2 target nucleic acids are NOT DETECTED.  The SARS-CoV-2 RNA is generally detectable in upper respiratory specimens during the acute phase of infection. The lowest concentration of SARS-CoV-2 viral copies this assay can detect is 138 copies/mL. A negative result does not preclude SARS-Cov-2 infection and should not be used as the sole basis for treatment  or other patient management decisions. A negative result may occur with  improper specimen collection/handling, submission of specimen other than nasopharyngeal swab, presence of viral mutation(s) within the areas targeted by this assay, and inadequate number of viral copies(<138 copies/mL). A negative result must be combined with clinical observations, patient history, and epidemiological information. The expected result is Negative.  Fact Sheet for Patients:  EntrepreneurPulse.com.au  Fact Sheet for Healthcare Providers:  IncredibleEmployment.be  This test is no t yet approved or cleared by the [place] FDA and  has been authorized for detection and/or diagnosis of SARS-CoV-2 by FDA under an Emergency Use Authorization (EUA). This EUA will remain  in effect (meaning this test can be used) for the duration of the COVID-19 declaration under Section 564(b)(1) of the Act, 21 U.S.C.section 360bbb-3(b)(1), unless the authorization is terminated  or revoked sooner.       Influenza A by PCR NEGATIVE NEGATIVE Final   Influenza B by PCR NEGATIVE NEGATIVE Final    Comment: (NOTE) The Xpert Xpress SARS-CoV-2/FLU/RSV plus assay is intended as an aid in the diagnosis of influenza from Nasopharyngeal swab specimens and should not be used as a sole basis for treatment. Nasal washings and aspirates are unacceptable for Xpert Xpress SARS-CoV-2/FLU/RSV testing.  Fact Sheet for Patients: EntrepreneurPulse.com.au  Fact Sheet for Healthcare Providers: IncredibleEmployment.be  This test is not yet approved or cleared by the [place] FDA and has been authorized for detection and/or diagnosis of SARS-CoV-2 by FDA under an Emergency Use Authorization (EUA). This EUA will remain in effect (meaning this test can be used) for the duration of the COVID-19 declaration under Section 564(b)(1) of the Act, 21 U.S.C. section  360bbb-3(b)(1), unless the authorization is terminated or revoked.  Performed at [facility], [address] [place], [place] [address]   MRSA Next Gen by PCR, Nasal     Status: None   Collection Time: 04/29/21  5:39 AM   Specimen: Nasal Mucosa; Nasal Swab  Result Value Ref Range Status   MRSA by PCR Next Gen NOT DETECTED NOT DETECTED Final    Comment: (NOTE) The GeneXpert MRSA Assay (FDA approved for NASAL specimens only), is one component of a comprehensive MRSA colonization surveillance program. It is not intended to diagnose MRSA infection nor to guide or monitor treatment for MRSA infections. Test performance is not FDA approved in patients less than 67 years old. Performed at [facility], [address], [place],  [address]          Radiology Studies: DG Wrist Complete Left  Result Date: 04/28/2021 CLINICAL DATA:  Left wrist pain after fall. EXAM: LEFT WRIST - COMPLETE 3+ VIEW COMPARISON:  None. FINDINGS: There is no evidence of fracture or dislocation. Severe  degenerative changes seen involving the first carpometacarpal joint. Chondrocalcinosis is seen involving the triangular fibrocartilage. Soft tissues are unremarkable. IMPRESSION: Severe osteoarthritis of first carpometacarpal joint. No acute abnormality seen. Electronically Signed   By: Marijo Conception M.D.   On: 04/28/2021 13:48   CT Head Wo Contrast  Result Date: 04/28/2021 CLINICAL DATA:  Fall.  Hypoxia. EXAM: CT HEAD WITHOUT CONTRAST TECHNIQUE: Contiguous axial images were obtained from the base of the skull through the vertex without intravenous contrast. COMPARISON:  11/27/2013 FINDINGS: Brain: No evidence of acute infarction, hemorrhage, hydrocephalus, extra-axial collection or mass lesion/mass effect. Mild ventricular and sulcal enlargement reflects age appropriate volume loss. There is mild periventricular white matter hypoattenuation consistent with chronic microvascular ischemic  change. Vascular: No hyperdense vessel or unexpected calcification. Skull: Normal. Negative for fracture or focal lesion. Sinuses/Orbits: Globes and orbits are unremarkable. Visualized sinuses are clear. Other: None. IMPRESSION: 1. No acute intracranial abnormalities. 2. Age-appropriate volume loss. Mild chronic microvascular ischemic change. Electronically Signed   By: Lajean Manes M.D.   On: 04/28/2021 15:58   DG Shoulder Left  Result Date: 04/28/2021 CLINICAL DATA:  Left shoulder pain after fall. EXAM: LEFT SHOULDER - 2+ VIEW COMPARISON:  None. FINDINGS: There is no evidence of fracture or dislocation. Severe degenerative changes seen involving the left glenohumeral joint. Soft tissues are unremarkable. IMPRESSION: Severe degenerative joint disease of the left glenohumeral joint. No acute abnormality seen. Electronically Signed   By: Marijo Conception M.D.   On: 04/28/2021 13:46   DG Knee Complete 4 Views Left  Result Date: 04/28/2021 CLINICAL DATA:  Left knee pain after fall. EXAM: LEFT KNEE - COMPLETE 4+ VIEW COMPARISON:  July 21, 2018. FINDINGS: No definite fracture or dislocation is noted. Small suprapatellar joint effusion is noted. Severe degenerative changes seen medially and laterally with osteophyte formation. Status post intramedullary rod fixation of right femur. IMPRESSION: Small suprapatellar joint effusion. Severe degenerative joint disease. No fracture or dislocation is noted. Electronically Signed   By: Marijo Conception M.D.   On: 04/28/2021 13:44        Scheduled Meds:  amLODipine  10 mg Oral Daily   baclofen  5 mg Oral BID   cholecalciferol  1,000 Units Oral Daily   citalopram  20 mg Oral Daily   diclofenac Sodium  4 g Topical TID   divalproex  125 mg Oral 2 times per day   divalproex  250 mg Oral QHS   enalapril  10 mg Oral BID   enoxaparin (LOVENOX) injection  40 mg Subcutaneous Q24H   feeding supplement  237 mL Oral BID BM   fluticasone  2 spray Each Nare Daily    fluticasone furoate-vilanterol  1 puff Inhalation Daily   guaiFENesin  600 mg Oral BID   ipratropium-albuterol  3 mL Nebulization Q6H   melatonin  10 mg Oral QHS   mirtazapine  7.5 mg Oral QHS   multivitamin with minerals  1 tablet Oral Daily   nicotine  21 mg Transdermal Daily   pantoprazole  40 mg Oral Daily   polyethylene glycol  17 g Oral Daily   polyvinyl alcohol  1 drop Both Eyes BID   senna-docusate  1 tablet Oral BID   tiotropium  18 mcg Inhalation Daily   traZODone  150 mg Oral QHS   Continuous Infusions:  azithromycin 500 mg (04/29/21 1752)   cefTRIAXone (ROCEPHIN)  IV Stopped (04/28/21 1536)   cefTRIAXone (ROCEPHIN)  IV  LOS: 2 days    Time spent: 25 minutes    Sidney Ace, MD [facility] Pager [phone]  If 7PM-7AM, please contact night-coverage 04/30/2021, 11:26 AM

## 2021-04-30 NOTE — Progress Notes (Signed)
   04/29/21 0135  Assess: MEWS Score  Temp 98 F (36.7 C)  BP (!) 159/77  Pulse Rate 82  Resp (!) 27  SpO2 98 %  O2 Device Nasal Cannula  Assess: MEWS Score  MEWS Temp 0  MEWS Systolic 0  MEWS Pulse 0  MEWS RR 2  MEWS LOC 0  MEWS Score 2  MEWS Score Color Yellow  Assess: if the MEWS score is Yellow or Red  Were vital signs taken at a resting state? Yes  Focused Assessment  (new patient admit)  Does the patient meet 2 or more of the SIRS criteria? No  MEWS guidelines implemented *See Row Information* Yes  Treat  MEWS Interventions Other (Comment) (assessed)  Take Vital Signs  Increase Vital Sign Frequency  Yellow: Q 2hr X 2 then Q 4hr X 2, if remains yellow, continue Q 4hrs  Escalate  MEWS: Escalate Yellow: discuss with charge nurse/RN and consider discussing with provider and RRT  Notify: Charge Nurse/RN  Name of Charge Nurse/RN Notified [place], RN  Date Charge Nurse/RN Notified 04/29/21  Time Charge Nurse/RN Notified 0142  Notify: Provider  Provider Name/Title Judd Gaudier, MD  Date Provider Notified 04/29/21  Time Provider Notified [phone]  Notification Type  (chat)  Assess: SIRS CRITERIA  SIRS Temperature  0  SIRS Pulse 0  SIRS Respirations  1  SIRS WBC 0  SIRS Score Sum  1  Inserted for [facility] RN

## 2021-04-30 NOTE — Plan of Care (Signed)
No acute events during the night. Resting quietly in bed. Oxygen saturations maintained on 2 L O2 via [place]. Call bell within reach. Bed in the lowest position. Wheels locked.    Problem: Education: Goal: Knowledge of General Education information will improve Description: Including pain rating scale, medication(s)/side effects and non-pharmacologic comfort measures Outcome: Progressing   Problem: Health Behavior/Discharge Planning: Goal: Ability to manage health-related needs will improve Outcome: Progressing   Problem: Clinical Measurements: Goal: Ability to maintain clinical measurements within normal limits will improve Outcome: Progressing Goal: Will remain free from infection Outcome: Progressing Goal: Diagnostic test results will improve Outcome: Progressing Goal: Respiratory complications will improve Outcome: Progressing Goal: Cardiovascular complication will be avoided Outcome: Progressing   Problem: Activity: Goal: Risk for activity intolerance will decrease Outcome: Progressing   Problem: Nutrition: Goal: Adequate nutrition will be maintained Outcome: Progressing   Problem: Coping: Goal: Level of anxiety will decrease Outcome: Progressing    Problem: Elimination: Goal: Will not experience complications related to bowel motility Outcome: Progressing Goal: Will not experience complications related to urinary retention Outcome: Progressing   Problem: Pain Managment: Goal: General experience of comfort will improve Outcome: Progressing   Problem: Safety: Goal: Ability to remain free from injury will improve Outcome: Progressing   Problem: Skin Integrity: Goal: Risk for impaired skin integrity will decrease Outcome: Progressing   Problem: Urinary Elimination: Goal: Signs and symptoms of infection will decrease Outcome: Progressing

## 2021-04-30 NOTE — Progress Notes (Signed)
*  PRELIMINARY RESULTS* Echocardiogram 2D Echocardiogram has been performed.  Kari Mcdonald 04/30/2021, 11:40 AM

## 2021-04-30 NOTE — NC FL2 (Signed)
[place] LEVEL OF CARE SCREENING TOOL     IDENTIFICATION  Patient Name: Kari Mcdonald Birthdate: 03/20/1932 Sex: female Admission Date (Current Location): 04/28/2021  [facility] and [place] Number:  Engineering geologist and Address:  [facility], [address], [place], [place] [address]      Provider Number: [id number]  Attending Physician Name and Address:  Sidney Ace, MD  Relative Name and Phone Number:  Jerilee Field (Daughter)   [phone] (Home Phone)    Current Level of Care: Hospital Recommended Level of Care: [place] Prior Approval Number:    Date Approved/Denied:   PASRR Number: [id number]  Discharge Plan: SNF    Current Diagnoses: Patient Active Problem List   Diagnosis Date Noted   Protein-calorie malnutrition, severe 04/30/2021   CAP (community acquired pneumonia) 04/28/2021   Lymphedema 11/01/2018   PAD (peripheral artery disease) ([place]) 11/01/2018   Hyponatremia 07/21/2018   Closed left hip fracture ([place]) 06/01/2017   Noninfectious diarrhea    Anemia 12/18/2016   COPD (chronic obstructive pulmonary disease) ([place]) 12/18/2016   Decreased hearing 12/18/2016   Depression 12/18/2016   GERD (gastroesophageal reflux disease) 12/18/2016   GI bleed 12/18/2016   CAD (coronary artery disease) 12/18/2016   Hypertension 12/18/2016   Knee osteoarthritis 12/18/2016   Osteoporosis 12/18/2016   Seasonal allergies 12/18/2016   Skin cancer 12/18/2016   AKI (acute kidney injury) ([place]) 11/17/2016   Chronic diarrhea    Loss of weight    Intractable cyclical vomiting with nausea    Hyperlipidemia, mixed 10/04/2014    Orientation RESPIRATION BLADDER Height & Weight     Self, Time, Place  Normal, O2 (2-3L) Continent, External catheter Weight: 47.2 kg Height:  '5\' 5"'$  (165.1 cm)  BEHAVIORAL SYMPTOMS/MOOD NEUROLOGICAL BOWEL NUTRITION STATUS     (Forgetful) Continent Diet (Regular)  AMBULATORY  STATUS COMMUNICATION OF NEEDS Skin   Limited Assist Verbally Skin abrasions (Abrasion right knee, being cleansed)                       Personal Care Assistance Level of Assistance  Bathing, Dressing, Feeding Bathing Assistance: Limited assistance Feeding assistance: Limited assistance Dressing Assistance: Limited assistance     Functional Limitations Info  Sight, Hearing, Speech Sight Info: Adequate Hearing Info: Impaired Speech Info: Adequate    SPECIAL CARE FACTORS FREQUENCY  PT (By licensed PT), OT (By licensed OT)     PT Frequency: as directed by facility OT Frequency: as directed by facility            Contractures Contractures Info: Not present    Additional Factors Info  Code Status, Allergies Code Status Info: FULL CODE Allergies Info: Penicillins           Current Medications (04/30/2021):  This is the current hospital active medication list Current Facility-Administered Medications  Medication Dose Route Frequency Provider Last Rate Last Admin   acetaminophen (TYLENOL) tablet 650 mg  650 mg Oral Q6H PRN Imagene Sheller S, DO   650 mg at 04/29/21 1755   Or   acetaminophen (TYLENOL) suppository 650 mg  650 mg Rectal Q6H PRN Imagene Sheller S, DO       amLODipine (NORVASC) tablet 10 mg  10 mg Oral Daily Imagene Sheller S, DO   10 mg at 04/30/21 0947   azithromycin (ZITHROMAX) 500 mg in sodium chloride 0.9 % 250 mL IVPB  500 mg Intravenous Q24H Imagene Sheller S, DO 250 mL/hr at 04/29/21  1752 500 mg at 04/29/21 1752   baclofen (LIORESAL) tablet 5 mg  5 mg Oral BID Imagene Sheller S, DO   5 mg at 04/30/21 [id number]   bisacodyl (DULCOLAX) EC tablet 5 mg  5 mg Oral Daily PRN Imagene Sheller S, DO       cefTRIAXone (ROCEPHIN) 1 g in sodium chloride 0.9 % 100 mL IVPB  1 g Intravenous Once Triplett, Cari B, FNP   Paused at 04/28/21 1536   cefTRIAXone (ROCEPHIN) 1 g in sodium chloride 0.9 % 100 mL IVPB  1 g Intravenous Q24H Imagene Sheller S, DO       cholecalciferol (VITAMIN D3)  tablet 1,000 Units  1,000 Units Oral Daily Imagene Sheller S, DO   1,000 Units at 04/30/21 0947   citalopram (CELEXA) tablet 20 mg  20 mg Oral Daily Imagene Sheller S, DO   20 mg at 04/30/21 [id number]   diclofenac Sodium (VOLTAREN) 1 % topical gel 4 g  4 g Topical TID Imagene Sheller S, DO   4 g at 04/30/21 1002   divalproex (DEPAKOTE) DR tablet 125 mg  125 mg Oral 2 times per day Imagene Sheller S, DO   125 mg at 04/30/21 1332   divalproex (DEPAKOTE) DR tablet 250 mg  250 mg Oral QHS Imagene Sheller S, DO   250 mg at 04/29/21 2234   enalapril (VASOTEC) tablet 10 mg  10 mg Oral BID Imagene Sheller S, DO   10 mg at 04/30/21 0949   enoxaparin (LOVENOX) injection 40 mg  40 mg Subcutaneous Q24H Imagene Sheller S, DO   40 mg at 04/29/21 2236   feeding supplement (ENSURE ENLIVE / ENSURE PLUS) liquid 237 mL  237 mL Oral BID BM Sreenath, Sudheer B, MD   237 mL at 04/30/21 1319   fluticasone (FLONASE) 50 MCG/ACT nasal spray 2 spray  2 spray Each Nare Daily Imagene Sheller S, DO   2 spray at 04/30/21 1005   fluticasone furoate-vilanterol (BREO ELLIPTA) 200-25 MCG/INH 1 puff  1 puff Inhalation Daily Imagene Sheller S, DO   1 puff at 04/30/21 1006   guaiFENesin (MUCINEX) 12 hr tablet 600 mg  600 mg Oral BID Imagene Sheller S, DO   600 mg at 04/30/21 0948   hydrALAZINE (APRESOLINE) injection 10 mg  10 mg Intravenous Q4H PRN Imagene Sheller S, DO       HYDROcodone-acetaminophen (NORCO/VICODIN) 5-325 MG per tablet 1-2 tablet  1-2 tablet Oral Q4H PRN Imagene Sheller S, DO   1 tablet at 04/30/21 1222   ipratropium-albuterol (DUONEB) 0.5-2.5 (3) MG/3ML nebulizer solution 3 mL  3 mL Nebulization Q6H Anwar, Shayan S, DO   3 mL at 04/30/21 1318   ipratropium-albuterol (DUONEB) 0.5-2.5 (3) MG/3ML nebulizer solution 3 mL  3 mL Nebulization Q2H PRN Imagene Sheller S, DO       melatonin tablet 10 mg  10 mg Oral QHS Anwar, Bonnee Quin S, DO   10 mg at 04/29/21 2234   mirtazapine (REMERON) tablet 7.5 mg  7.5 mg Oral QHS Imagene Sheller S, DO   7.5 mg at 04/29/21  2235   morphine 2 MG/ML injection 2 mg  2 mg Intravenous Q2H PRN Imagene Sheller S, DO       multivitamin with minerals tablet 1 tablet  1 tablet Oral Daily Ralene Muskrat B, MD   1 tablet at 04/30/21 0947   nicotine (NICODERM CQ - dosed in mg/24 hours) patch 21 mg  21 mg Transdermal Daily Imagene Sheller S, DO  21 mg at 04/30/21 0944   ondansetron (ZOFRAN) tablet 4 mg  4 mg Oral Q6H PRN Imagene Sheller S, DO       Or   ondansetron (ZOFRAN) injection 4 mg  4 mg Intravenous Q6H PRN Imagene Sheller S, DO       pantoprazole (PROTONIX) EC tablet 40 mg  40 mg Oral Daily Imagene Sheller S, DO   40 mg at 04/30/21 0948   polyethylene glycol (MIRALAX / GLYCOLAX) packet 17 g  17 g Oral Daily PRN Imagene Sheller S, DO       polyethylene glycol (MIRALAX / GLYCOLAX) packet 17 g  17 g Oral Daily Imagene Sheller S, DO   17 g at 04/30/21 0947   polyvinyl alcohol (LIQUIFILM TEARS) 1.4 % ophthalmic solution 1 drop  1 drop Both Eyes BID Imagene Sheller S, DO   1 drop at 04/30/21 1007   senna-docusate (Senokot-S) tablet 1 tablet  1 tablet Oral BID Imagene Sheller S, DO   1 tablet at 04/30/21 0947   sodium phosphate (FLEET) 7-19 GM/118ML enema 1 enema  1 enema Rectal Once PRN Imagene Sheller S, DO       tiotropium (SPIRIVA) inhalation capsule ([facility] use ONLY) 18 mcg  18 mcg Inhalation Daily Imagene Sheller S, DO   18 mcg at 04/30/21 1003   traZODone (DESYREL) tablet 150 mg  150 mg Oral QHS Imagene Sheller S, DO   150 mg at 04/29/21 2233     Discharge Medications: Please see discharge summary for a list of discharge medications.  Relevant Imaging Results:  Relevant Lab Results:   Additional Information [id number]  Pete Pelt, RN

## 2021-05-01 DIAGNOSIS — J189 Pneumonia, unspecified organism: Secondary | ICD-10-CM | POA: Diagnosis not present

## 2021-05-01 LAB — CBC WITH DIFFERENTIAL/PLATELET
Abs Immature Granulocytes: 0.14 10*3/uL — ABNORMAL HIGH (ref 0.00–0.07)
Basophils Absolute: 0 10*3/uL (ref 0.0–0.1)
Basophils Relative: 0 %
Eosinophils Absolute: 0.3 10*3/uL (ref 0.0–0.5)
Eosinophils Relative: 4 %
HCT: 32.6 % — ABNORMAL LOW (ref 36.0–46.0)
Hemoglobin: 10.6 g/dL — ABNORMAL LOW (ref 12.0–15.0)
Immature Granulocytes: 2 %
Lymphocytes Relative: 26 %
Lymphs Abs: 2 10*3/uL (ref 0.7–4.0)
MCH: 31.2 pg (ref 26.0–34.0)
MCHC: 32.5 g/dL (ref 30.0–36.0)
MCV: 95.9 fL (ref 80.0–100.0)
Monocytes Absolute: 0.7 10*3/uL (ref 0.1–1.0)
Monocytes Relative: 9 %
Neutro Abs: 4.6 10*3/uL (ref 1.7–7.7)
Neutrophils Relative %: 59 %
Platelets: 299 10*3/uL (ref 150–400)
RBC: 3.4 MIL/uL — ABNORMAL LOW (ref 3.87–5.11)
RDW: 13.4 % (ref 11.5–15.5)
WBC: 7.8 10*3/uL (ref 4.0–10.5)
nRBC: 0 % (ref 0.0–0.2)

## 2021-05-01 LAB — BASIC METABOLIC PANEL
Anion gap: 8 (ref 5–15)
BUN: 17 mg/dL (ref 8–23)
CO2: 29 mmol/L (ref 22–32)
Calcium: 9.2 mg/dL (ref 8.9–10.3)
Chloride: 102 mmol/L (ref 98–111)
Creatinine, Ser: 0.59 mg/dL (ref 0.44–1.00)
GFR, Estimated: >60 mL/min (ref >60)
Glucose, Bld: 98 mg/dL (ref 70–99)
Potassium: 3.6 mmol/L (ref 3.5–5.1)
Sodium: 139 mmol/L (ref 135–145)

## 2021-05-01 LAB — RESP PANEL BY RT-PCR (FLU A&B, COVID) ARPGX2
Influenza A by PCR: NEGATIVE
Influenza B by PCR: NEGATIVE
SARS Coronavirus 2 by RT PCR: NEGATIVE

## 2021-05-01 LAB — URINE CULTURE: Culture: >=100000 — AB

## 2021-05-01 LAB — MAGNESIUM: Magnesium: 2 mg/dL (ref 1.7–2.4)

## 2021-05-01 MED ORDER — AZITHROMYCIN 500 MG PO TABS: 500.0000 mg | ORAL_TABLET | Freq: Every day | ORAL | 0 refills | Status: DC

## 2021-05-01 MED ORDER — AZITHROMYCIN 500 MG PO TABS: 500.0000 mg | ORAL_TABLET | Freq: Every day | ORAL | Status: DC

## 2021-05-01 MED ORDER — LEVOFLOXACIN 750 MG PO TABS: 750.0000 mg | ORAL_TABLET | Freq: Every day | ORAL | 0 refills | Status: AC

## 2021-05-01 MED ORDER — GUAIFENESIN ER 600 MG PO TB12: 600.0000 mg | ORAL_TABLET | Freq: Two times a day (BID) | ORAL | Status: DC

## 2021-05-01 MED ORDER — LEVOFLOXACIN 500 MG PO TABS
750.0000 mg | ORAL_TABLET | ORAL | Status: DC
Start: 2021-05-01 — End: 2021-05-01
  Administered 2021-05-01: 750 mg via ORAL
  Filled 2021-05-01: qty 2

## 2021-05-01 NOTE — Care Management Important Message (Signed)
Important Message  Patient Details  Name: COPPER WITKIN MRN: [id number] Date of Birth: 1932/06/23   Medicare Important Message Given:  N/A - LOS <3 / Initial given by admissions     Juliann Pulse A Jackie Russman 05/01/2021, 9:40 AM

## 2021-05-01 NOTE — Discharge Summary (Signed)
Physician Discharge Summary  SHONTAY SPADEA [id number] DOB: 1931-11-16 DOA: 04/28/2021  PCP: Sofie Hartigan, MD  Admit date: 04/28/2021 Discharge date: 05/01/2021  Admitted From: ALF Disposition: SNF  Recommendations for Outpatient Follow-up:  Follow up with PCP in 1-2 weeks Follow-up outpatient cardiology  Home Health: No Equipment/Devices: None  Discharge Condition: Stable CODE STATUS: Full Diet recommendation: Heart Healthy  Brief/Interim Summary: 87 y.o. female with a past medical history of COPD with chronic hypoxic respiratory failure on 2 L at night, hypertension, tobacco dependence currently smoking 2 packs/day, osteoporosis, GERD, depression, history of myocardial infarction, ambulatory dysfunction mostly wheelchair-bound.   She presents from [place] where she is a long-term resident.  She had a fall at the nursing home on Thursday and Friday of last week but was not sent to the ER for this.  Weekend staff reported that she was complaining of left shoulder, left wrist pain for the past 2 days.  Her left knee is more swollen than usual.  The patient is typically able to transfer from wheelchair to bed/toilet without assistance and is usually able to wheel herself outside to smoke cigarettes.  She has not been to do that this weekend.    Patient had evidence of a urinary tract infection with noted bacteria on urinalysis.  She also has evidence of community-acquired pneumonia with hazy infiltrate on chest x-ray.  Started on community-acquired pneumonia treatment.  Urine culture with pansensitive E. coli   Also noted new onset atrial fibrillation.  Controlled ventricular response.  Rapid heart rate occasionally precipitated by exertion however quickly returned to normal.  At time of discharge we had a lengthy conversation with the patient's daughter at bedside.  Explained the pathophysiology and potential risk factors of atrial fibrillation.  The daughter  relates that the mother is a fall risk and has had multiple falls in the past.  At time of discharge I do not recommend therapeutic anticoagulation as patient is of advanced age and fall risk and likely that risks of anticoagulation outweigh benefits.  Given that patient is rate controlled I have also not started any rate or rhythm control agents.  I will recommend this patient see outpatient cardiology and follow-up to discuss further risks versus benefits of anticoagulation and potential rate versus rhythm control strategies were atrial fibrillation.   Discharge Diagnoses:  Active Problems:   CAP (community acquired pneumonia)   Protein-calorie malnutrition, severe  Acute on chronic hypoxic respiratory failure Community-acquired bacterial pneumonia Patient meets hypoxic respiratory failure criteria with 84% saturation on room air She does use oxygen but only at night Plan: Completed 5-day course of antibiotics for a community-acquired pneumonia.  This should cover urinary pathogens as well.  Patient is on 2 L at time of discharge and will discharge on this rate.  I recommend the patient remain on 2 L continuous.  Goal oxygen saturation in the setting of advanced COPD 88-92%.   Urinary tract infection Patient with evidence of a UTI on urinalysis Urine culture with E. coli > 100,000 CFU Pansensitive bacteria.  Will be covered by CAP treatment   New onset atrial fibrillation Newly diagnosed chronic diastolic congestive heart failure Patient's rate is likely driven by exertion.  At time of discharge she is rate controlled.  Will not start any rate or rhythm control agents at this time.  We will have the patient follow-up with [facility] cardiology on discharge to discuss rate rhythm control strategies and risks versus benefits of anticoagulation.  Patient does  have elements of diastolic dysfunction on echocardiogram.  She is euvolemic on my examination.  No diuretics have been started.  Defer to  cardiology as outpatient.   Acute metabolic encephalopathy Likely secondary to above infections Mental status appears to be improving Continue treating infections    Mechanical fall No acute fractures or dislocations Pain control as needed Supportive care Therapy evaluations   Essential hypertension Norvasc 10 daily Vasotec 10 daily   Depression Insomnia PTA Celexa PTA Remeron and thjhjrazodone   GERD PPI   Tobacco dependence Smokes 2 packs daily  Per the patient's daughter she is smoke since she was 10 and is unlikely to quit   COPD No evidence of acute exacerbation  Discharge Instructions  Discharge Instructions     Ambulatory referral to Smoking Cessation Program   Complete by: As directed    Diet - low sodium heart healthy   Complete by: As directed    Increase activity slowly   Complete by: As directed       Allergies as of 05/01/2021       Reactions   Penicillins Rash   Has patient had a PCN reaction causing immediate rash, facial/tongue/throat swelling, SOB or lightheadedness with hypotension: Unknown Has patient had a PCN reaction causing severe rash involving mucus membranes or skin necrosis: Unknown Has patient had a PCN reaction that required hospitalization: Unknown Has patient had a PCN reaction occurring within the last 10 years: Unknown If all of the above answers are "NO", then may proceed with Cephalosporin use.        Medication List     TAKE these medications    acetaminophen 650 MG CR tablet Commonly known as: TYLENOL Take 1,300 mg by mouth 2 (two) times daily.   albuterol 108 (90 Base) MCG/ACT inhaler Commonly known as: VENTOLIN HFA Inhale 2 puffs into the lungs every 6 (six) hours as needed for wheezing or shortness of breath.   amLODipine 10 MG tablet Commonly known as: NORVASC Take 10 mg by mouth daily.   azithromycin 500 MG tablet Commonly known as: ZITHROMAX Take 1 tablet (500 mg total) by mouth daily for 3 days.    Baclofen 5 MG Tabs Take 5 mg by mouth 2 (two) times daily.   bisacodyl 10 MG suppository Commonly known as: DULCOLAX Place 10 mg rectally daily as needed for moderate constipation or severe constipation.   carboxymethylcellulose 0.5 % Soln Commonly known as: REFRESH PLUS Place 1 drop into both eyes 2 (two) times daily.   cholecalciferol 25 MCG (1000 UNIT) tablet Commonly known as: VITAMIN D3 Take 1,000 Units by mouth daily.   citalopram 20 MG tablet Commonly known as: CELEXA Take 20 mg by mouth daily.   diclofenac Sodium 1 % Gel Commonly known as: VOLTAREN Apply 4 g topically 3 (three) times daily. (Apply to bilateral knees)   divalproex 125 MG DR tablet Commonly known as: DEPAKOTE Take 125 mg by mouth 2 (two) times daily. (0900 and 1400)   divalproex 125 MG DR tablet Commonly known as: DEPAKOTE Take 250 mg by mouth at bedtime.   enalapril 10 MG tablet Commonly known as: VASOTEC Take 10 mg by mouth 2 (two) times daily.   fluticasone 50 MCG/ACT nasal spray Commonly known as: FLONASE Place 2 sprays into both nostrils daily.   Fluticasone-Salmeterol 250-50 MCG/DOSE Aepb Commonly known as: ADVAIR Inhale 1 puff into the lungs every 12 (twelve) hours.   guaiFENesin 600 MG 12 hr tablet Commonly known as: MUCINEX Take 1 tablet (  600 mg total) by mouth 2 (two) times daily.   HYDROcodone-acetaminophen 10-325 MG tablet Commonly known as: NORCO Take 1 tablet by mouth every 6 (six) hours as needed for moderate pain.   levofloxacin 750 MG tablet Commonly known as: LEVAQUIN Take 1 tablet (750 mg total) by mouth daily for 3 days.   Melatonin 10 MG Tabs Take 10 mg by mouth at bedtime.   mirtazapine 7.5 MG tablet Commonly known as: REMERON Take 7.5 mg by mouth at bedtime.   omeprazole 40 MG capsule Commonly known as: PRILOSEC Take 40 mg by mouth daily before breakfast.   polyethylene glycol 17 g packet Commonly known as: MIRALAX / GLYCOLAX Take 17 g by mouth  daily.   sennosides-docusate sodium 8.6-50 MG tablet Commonly known as: SENOKOT-S Take 1 tablet by mouth 2 (two) times daily.   tiotropium 18 MCG inhalation capsule Commonly known as: SPIRIVA Place 18 mcg into inhaler and inhale daily.   traZODone 150 MG tablet Commonly known as: DESYREL Take 150 mg by mouth at bedtime.        Contact information for after-discharge care     Destination     [facility] .   Service: Skilled Nursing Contact information: [address] [phone]                    Allergies  Allergen Reactions   Penicillins Rash    Has patient had a PCN reaction causing immediate rash, facial/tongue/throat swelling, SOB or lightheadedness with hypotension: Unknown Has patient had a PCN reaction causing severe rash involving mucus membranes or skin necrosis: Unknown Has patient had a PCN reaction that required hospitalization: Unknown Has patient had a PCN reaction occurring within the last 10 years: Unknown If all of the above answers are "NO", then may proceed with Cephalosporin use.     Consultations: None   Procedures/Studies: DG Chest 2 View  Result Date: 04/28/2021 CLINICAL DATA:  Dyspnea, hypoxia, fall EXAM: CHEST - 2 VIEW COMPARISON:  07/21/2018 chest radiograph. FINDINGS: Stable cardiomediastinal silhouette with normal heart size. No pneumothorax. No pleural effusion. Patchy left lung base consolidation is new. Clear right lung. No displaced fractures in the visualized chest. IMPRESSION: New patchy left lung base consolidation, suggesting pneumonia. Chest radiograph follow-up advised. Electronically Signed   By: Ilona Sorrel M.D.   On: 04/28/2021 11:26   DG Wrist Complete Left  Result Date: 04/28/2021 CLINICAL DATA:  Left wrist pain after fall. EXAM: LEFT WRIST - COMPLETE 3+ VIEW COMPARISON:  None. FINDINGS: There is no evidence of fracture or dislocation. Severe  degenerative changes seen involving the first carpometacarpal joint. Chondrocalcinosis is seen involving the triangular fibrocartilage. Soft tissues are unremarkable. IMPRESSION: Severe osteoarthritis of first carpometacarpal joint. No acute abnormality seen. Electronically Signed   By: Marijo Conception M.D.   On: 04/28/2021 13:48   CT Head Wo Contrast  Result Date: 04/28/2021 CLINICAL DATA:  Fall.  Hypoxia. EXAM: CT HEAD WITHOUT CONTRAST TECHNIQUE: Contiguous axial images were obtained from the base of the skull through the vertex without intravenous contrast. COMPARISON:  11/27/2013 FINDINGS: Brain: No evidence of acute infarction, hemorrhage, hydrocephalus, extra-axial collection or mass lesion/mass effect. Mild ventricular and sulcal enlargement reflects age appropriate volume loss. There is mild periventricular white matter hypoattenuation consistent with chronic microvascular ischemic change. Vascular: No hyperdense vessel or unexpected calcification. Skull: Normal. Negative for fracture or focal lesion. Sinuses/Orbits: Globes and orbits are unremarkable. Visualized sinuses are  clear. Other: None. IMPRESSION: 1. No acute intracranial abnormalities. 2. Age-appropriate volume loss. Mild chronic microvascular ischemic change. Electronically Signed   By: Lajean Manes M.D.   On: 04/28/2021 15:58   DG Shoulder Left  Result Date: 04/28/2021 CLINICAL DATA:  Left shoulder pain after fall. EXAM: LEFT SHOULDER - 2+ VIEW COMPARISON:  None. FINDINGS: There is no evidence of fracture or dislocation. Severe degenerative changes seen involving the left glenohumeral joint. Soft tissues are unremarkable. IMPRESSION: Severe degenerative joint disease of the left glenohumeral joint. No acute abnormality seen. Electronically Signed   By: Marijo Conception M.D.   On: 04/28/2021 13:46   DG Knee Complete 4 Views Left  Result Date: 04/28/2021 CLINICAL DATA:  Left knee pain after fall. EXAM: LEFT KNEE - COMPLETE 4+ VIEW  COMPARISON:  July 21, 2018. FINDINGS: No definite fracture or dislocation is noted. Small suprapatellar joint effusion is noted. Severe degenerative changes seen medially and laterally with osteophyte formation. Status post intramedullary rod fixation of right femur. IMPRESSION: Small suprapatellar joint effusion. Severe degenerative joint disease. No fracture or dislocation is noted. Electronically Signed   By: Marijo Conception M.D.   On: 04/28/2021 13:44   ECHOCARDIOGRAM COMPLETE  Result Date: 04/30/2021    ECHOCARDIOGRAM REPORT   Patient Name:   RHEALYNN EDWARDS Mitter Date of Exam: 04/30/2021 Medical Rec #:  [id number]           Height:       65.0 in Accession #:    [id number]          Weight:       104.1 lb Date of Birth:  1931/10/13           BSA:          1.499 m Patient Age:    6 years            BP:           140/71 mmHg Patient Gender: F                   HR:           94 bpm. Exam Location:  [facility] Procedure: 2D Echo, Cardiac Doppler and Color Doppler Indications:     Atrial Fibrillation I48.91  History:         Patient has prior history of Echocardiogram examinations, most                  recent 07/23/2018. Previous Myocardial Infarction, COPD; Risk                  Factors:Hypertension.  Sonographer:     Sherrie Sport RDCS (AE) Referring Phys:  [id number] Sidney Ace Diagnosing Phys: Yolonda Kida MD  Sonographer Comments: Technically challenging study due to limited acoustic windows, no subcostal window and no parasternal window. Image acquisition challenging due to patient body habitus. IMPRESSIONS  1. Left ventricular ejection fraction, by estimation, is 55 to 60%. The left ventricle has normal function. The left ventricle has no regional wall motion abnormalities. Left ventricular diastolic parameters are consistent with Grade III diastolic dysfunction (restrictive).  2. Right ventricular systolic function is normal. The right ventricular size is normal.  3. The mitral valve is normal in  structure. Trivial mitral valve regurgitation.  4. The aortic valve is normal in structure. Aortic valve regurgitation is trivial. FINDINGS  Left Ventricle: Left ventricular ejection fraction, by estimation, is 55 to 60%. The left ventricle has normal  function. The left ventricle has no regional wall motion abnormalities. The left ventricular internal cavity size was normal in size. There is  no left ventricular hypertrophy. Left ventricular diastolic parameters are consistent with Grade III diastolic dysfunction (restrictive). Right Ventricle: The right ventricular size is normal. No increase in right ventricular wall thickness. Right ventricular systolic function is normal. Left Atrium: Left atrial size was normal in size. Right Atrium: Right atrial size was normal in size. Pericardium: There is no evidence of pericardial effusion. Mitral Valve: The mitral valve is normal in structure. Trivial mitral valve regurgitation. Tricuspid Valve: The tricuspid valve is normal in structure. Tricuspid valve regurgitation is trivial. Aortic Valve: The aortic valve is normal in structure. Aortic valve regurgitation is trivial. Aortic valve mean gradient measures 5.0 mmHg. Aortic valve peak gradient measures 8.5 mmHg. Aortic valve area, by VTI measures 3.35 cm. Pulmonic Valve: The pulmonic valve was normal in structure. Pulmonic valve regurgitation is not visualized. Aorta: The ascending aorta was not well visualized. IAS/Shunts: No atrial level shunt detected by color flow Doppler.  LEFT VENTRICLE PLAX 2D LVIDd:         2.72 cm  Diastology LVIDs:         1.96 cm  LV e' medial:    6.20 cm/s LV PW:         0.72 cm  LV E/e' medial:  10.4 LV IVS:        0.77 cm  LV e' lateral:   5.66 cm/s LVOT diam:     2.00 cm  LV E/e' lateral: 11.4 LV SV:         65 LV SV Index:   44 LVOT Area:     3.14 cm  LEFT ATRIUM             Index       RIGHT ATRIUM          Index LA diam:        3.90 cm 2.60 cm/m  RA Area:     9.54 cm LA Vol (A2C):    56.6 ml 37.77 ml/m RA Volume:   17.30 ml 11.54 ml/m LA Vol (A4C):   64.2 ml 42.84 ml/m LA Biplane Vol: 61.2 ml 40.84 ml/m  AORTIC VALVE AV Area (Vmax):    2.65 cm AV Area (Vmean):   3.03 cm AV Area (VTI):     3.35 cm AV Vmax:           146.00 cm/s AV Vmean:          95.200 cm/s AV VTI:            0.195 m AV Peak Grad:      8.5 mmHg AV Mean Grad:      5.0 mmHg LVOT Vmax:         123.00 cm/s LVOT Vmean:        91.700 cm/s LVOT VTI:          0.208 m LVOT/AV VTI ratio: 1.07 MITRAL VALVE               TRICUSPID VALVE MV Area (PHT): 2.34 cm    TR Peak grad:   34.3 mmHg MV Decel Time: 324 msec    TR Vmax:        293.00 cm/s MV E velocity: 64.70 cm/s MV A velocity: 97.70 cm/s  SHUNTS MV E/A ratio:  0.66        Systemic VTI:  0.21 m  Systemic Diam: 2.00 cm Yolonda Kida MD Electronically signed by Yolonda Kida MD Signature Date/Time: 04/30/2021/1:32:03 PM    Final    (Echo, Carotid, EGD, Colonoscopy, ERCP)    Subjective: Patient seen and examined on day of discharge.  Sleepy but in no apparent distress.  Stable for discharge back to skilled nursing facility.  Discharge Exam: Vitals:   05/01/21 0451 05/01/21 0819  BP: (!) 154/72 116/81  Pulse: 72 78  Resp: 20 16  Temp: 98.3 F (36.8 C) 98.3 F (36.8 C)  SpO2: 99% 96%   Vitals:   05/01/21 0005 05/01/21 0129 05/01/21 0451 05/01/21 0819  BP: (!) 99/53  (!) 154/72 116/81  Pulse: 78  72 78  Resp: '18  20 16  '$ Temp: 98.4 F (36.9 C)  98.3 F (36.8 C) 98.3 F (36.8 C)  TempSrc:      SpO2: 100% 95% 99% 96%  Weight:      Height:        General: Pt is alert, awake, not in acute distress Cardiovascular: RRR, S1/S2 +, no rubs, no gallops Respiratory: CTA bilaterally, no wheezing, no rhonchi Abdominal: Soft, NT, ND, bowel sounds + Extremities: no edema, no cyanosis    The results of significant diagnostics from this hospitalization (including imaging, microbiology, ancillary and laboratory) are listed below  for reference.     Microbiology: Recent Results (from the past 240 hour(s))  Urine Culture     Status: Abnormal   Collection Time: 04/28/21 12:30 PM   Specimen: Urine, Random  Result Value Ref Range Status   Specimen Description   Final    URINE, RANDOM Performed at [facility], [address], [place], [place] [address]    Special Requests   Final    NONE Performed at [facility], [place], [place] [address]    Culture >=100,000 COLONIES/mL ESCHERICHIA COLI (A)  Final   Report Status 05/01/2021 FINAL  Final   Organism ID, Bacteria ESCHERICHIA COLI (A)  Final      Susceptibility   Escherichia coli - MIC*    AMPICILLIN <=2 SENSITIVE Sensitive     CEFAZOLIN <=4 SENSITIVE Sensitive     CEFEPIME <=0.12 SENSITIVE Sensitive     CEFTRIAXONE <=0.25 SENSITIVE Sensitive     CIPROFLOXACIN <=0.25 SENSITIVE Sensitive     GENTAMICIN <=1 SENSITIVE Sensitive     IMIPENEM <=0.25 SENSITIVE Sensitive     NITROFURANTOIN <=16 SENSITIVE Sensitive     TRIMETH/SULFA <=20 SENSITIVE Sensitive     AMPICILLIN/SULBACTAM <=2 SENSITIVE Sensitive     PIP/TAZO <=4 SENSITIVE Sensitive     * >=100,000 COLONIES/mL ESCHERICHIA COLI  Resp Panel by RT-PCR (Flu A&B, Covid) Nasopharyngeal Swab     Status: None   Collection Time: 04/28/21  6:36 PM   Specimen: Nasopharyngeal Swab; Nasopharyngeal(NP) swabs in vial transport medium  Result Value Ref Range Status   SARS Coronavirus 2 by RT PCR NEGATIVE NEGATIVE Final    Comment: (NOTE) SARS-CoV-2 target nucleic acids are NOT DETECTED.  The SARS-CoV-2 RNA is generally detectable in upper respiratory specimens during the acute phase of infection. The lowest concentration of SARS-CoV-2 viral copies this assay can detect is 138 copies/mL. A negative result does not preclude SARS-Cov-2 infection and should not be used as the sole basis for treatment or other patient management decisions. A negative result may occur with  improper  specimen collection/handling, submission of specimen other than nasopharyngeal swab, presence of viral mutation(s) within the areas targeted by  this assay, and inadequate number of viral copies(<138 copies/mL). A negative result must be combined with clinical observations, patient history, and epidemiological information. The expected result is Negative.  Fact Sheet for Patients:  EntrepreneurPulse.com.au  Fact Sheet for Healthcare Providers:  IncredibleEmployment.be  This test is no t yet approved or cleared by the [place] FDA and  has been authorized for detection and/or diagnosis of SARS-CoV-2 by FDA under an Emergency Use Authorization (EUA). This EUA will remain  in effect (meaning this test can be used) for the duration of the COVID-19 declaration under Section 564(b)(1) of the Act, 21 U.S.C.section 360bbb-3(b)(1), unless the authorization is terminated  or revoked sooner.       Influenza A by PCR NEGATIVE NEGATIVE Final   Influenza B by PCR NEGATIVE NEGATIVE Final    Comment: (NOTE) The Xpert Xpress SARS-CoV-2/FLU/RSV plus assay is intended as an aid in the diagnosis of influenza from Nasopharyngeal swab specimens and should not be used as a sole basis for treatment. Nasal washings and aspirates are unacceptable for Xpert Xpress SARS-CoV-2/FLU/RSV testing.  Fact Sheet for Patients: EntrepreneurPulse.com.au  Fact Sheet for Healthcare Providers: IncredibleEmployment.be  This test is not yet approved or cleared by the [place] FDA and has been authorized for detection and/or diagnosis of SARS-CoV-2 by FDA under an Emergency Use Authorization (EUA). This EUA will remain in effect (meaning this test can be used) for the duration of the COVID-19 declaration under Section 564(b)(1) of the Act, 21 U.S.C. section 360bbb-3(b)(1), unless the authorization is terminated or revoked.  Performed at  [facility], [address] [place], [place] [address]   MRSA Next Gen by PCR, Nasal     Status: None   Collection Time: 04/29/21  5:39 AM   Specimen: Nasal Mucosa; Nasal Swab  Result Value Ref Range Status   MRSA by PCR Next Gen NOT DETECTED NOT DETECTED Final    Comment: (NOTE) The GeneXpert MRSA Assay (FDA approved for NASAL specimens only), is one component of a comprehensive MRSA colonization surveillance program. It is not intended to diagnose MRSA infection nor to guide or monitor treatment for MRSA infections. Test performance is not FDA approved in patients less than 1 years old. Performed at [facility], [address] [place], [place] [address]      Labs: BNP (last 3 results) No results for input(s): BNP in the last 8760 hours. Basic Metabolic Panel: Recent Labs  Lab 04/28/21 1049 04/29/21 0409 05/01/21 0543  NA 140 138 139  K 4.3 4.2 3.6  CL 108 106 102  CO2 '27 26 29  '$ GLUCOSE 101* 92 98  BUN '19 13 17  '$ CREATININE 0.84 0.62 0.59  CALCIUM 9.4 8.6* 9.2  MG  --   --  2.0   Liver Function Tests: No results for input(s): AST, ALT, ALKPHOS, BILITOT, PROT, ALBUMIN in the last 168 hours. No results for input(s): LIPASE, AMYLASE in the last 168 hours. No results for input(s): AMMONIA in the last 168 hours. CBC: Recent Labs  Lab 04/28/21 1049 04/29/21 0409 05/01/21 0543  WBC 10.3 7.6 7.8  NEUTROABS  --   --  4.6  HGB 11.6* 9.8* 10.6*  HCT 35.6* 30.2* 32.6*  MCV 97.8 96.2 95.9  PLT 290 252 299   Cardiac Enzymes: No results for input(s): CKTOTAL, CKMB, CKMBINDEX, TROPONINI in the last 168 hours. BNP: Invalid input(s): POCBNP CBG: No results for input(s): GLUCAP in the last 168 hours. D-Dimer No results for input(s): DDIMER in  the last 72 hours. Hgb A1c No results for input(s): HGBA1C in the last 72 hours. Lipid Profile No results for input(s): CHOL, HDL, LDLCALC, TRIG, CHOLHDL, LDLDIRECT in the last 72 hours. Thyroid function  studies No results for input(s): TSH, T4TOTAL, T3FREE, THYROIDAB in the last 72 hours.  Invalid input(s): FREET3 Anemia work up No results for input(s): VITAMINB12, FOLATE, FERRITIN, TIBC, IRON, RETICCTPCT in the last 72 hours. Urinalysis    Component Value Date/Time   COLORURINE YELLOW (A) 04/28/2021 1230   APPEARANCEUR HAZY (A) 04/28/2021 1230   APPEARANCEUR Clear 11/27/2013 1846   LABSPEC 1.013 04/28/2021 1230   LABSPEC 1.021 11/27/2013 1846   PHURINE 6.0 04/28/2021 1230   GLUCOSEU NEGATIVE 04/28/2021 1230   GLUCOSEU Negative 11/27/2013 1846   HGBUR SMALL (A) 04/28/2021 1230   BILIRUBINUR NEGATIVE 04/28/2021 1230   BILIRUBINUR Negative 11/27/2013 1846   KETONESUR NEGATIVE 04/28/2021 1230   PROTEINUR NEGATIVE 04/28/2021 1230   NITRITE POSITIVE (A) 04/28/2021 1230   LEUKOCYTESUR MODERATE (A) 04/28/2021 1230   LEUKOCYTESUR Negative 11/27/2013 1846   Sepsis Labs Invalid input(s): PROCALCITONIN,  WBC,  LACTICIDVEN Microbiology Recent Results (from the past 240 hour(s))  Urine Culture     Status: Abnormal   Collection Time: 04/28/21 12:30 PM   Specimen: Urine, Random  Result Value Ref Range Status   Specimen Description   Final    URINE, RANDOM Performed at [facility] [facility], [address] [place], [place] [address]    Special Requests   Final    NONE Performed at [facility], [address] [place], [place] [address]    Culture >=100,000 COLONIES/mL ESCHERICHIA COLI (A)  Final   Report Status 05/01/2021 FINAL  Final   Organism ID, Bacteria ESCHERICHIA COLI (A)  Final      Susceptibility   Escherichia coli - MIC*    AMPICILLIN <=2 SENSITIVE Sensitive     CEFAZOLIN <=4 SENSITIVE Sensitive     CEFEPIME <=0.12 SENSITIVE Sensitive     CEFTRIAXONE <=0.25 SENSITIVE Sensitive     CIPROFLOXACIN <=0.25 SENSITIVE Sensitive     GENTAMICIN <=1 SENSITIVE Sensitive     IMIPENEM <=0.25 SENSITIVE Sensitive     NITROFURANTOIN <=16 SENSITIVE Sensitive      TRIMETH/SULFA <=20 SENSITIVE Sensitive     AMPICILLIN/SULBACTAM <=2 SENSITIVE Sensitive     PIP/TAZO <=4 SENSITIVE Sensitive     * >=100,000 COLONIES/mL ESCHERICHIA COLI  Resp Panel by RT-PCR (Flu A&B, Covid) Nasopharyngeal Swab     Status: None   Collection Time: 04/28/21  6:36 PM   Specimen: Nasopharyngeal Swab; Nasopharyngeal(NP) swabs in vial transport medium  Result Value Ref Range Status   SARS Coronavirus 2 by RT PCR NEGATIVE NEGATIVE Final    Comment: (NOTE) SARS-CoV-2 target nucleic acids are NOT DETECTED.  The SARS-CoV-2 RNA is generally detectable in upper respiratory specimens during the acute phase of infection. The lowest concentration of SARS-CoV-2 viral copies this assay can detect is 138 copies/mL. A negative result does not preclude SARS-Cov-2 infection and should not be used as the sole basis for treatment or other patient management decisions. A negative result may occur with  improper specimen collection/handling, submission of specimen other than nasopharyngeal swab, presence of viral mutation(s) within the areas targeted by this assay, and inadequate number of viral copies(<138 copies/mL). A negative result must be combined with clinical observations, patient history, and epidemiological information. The expected result is Negative.  Fact Sheet for Patients:  EntrepreneurPulse.com.au  Fact Sheet for Healthcare Providers:  IncredibleEmployment.be  This test is no t yet approved or cleared by the [place] and  has been authorized for detection and/or diagnosis of SARS-CoV-2 by FDA under an Emergency Use Authorization (EUA). This EUA will remain  in effect (meaning this test can be used) for the duration of the COVID-19 declaration under Section 564(b)(1) of the Act, 21 U.S.C.section 360bbb-3(b)(1), unless the authorization is terminated  or revoked sooner.       Influenza A by PCR NEGATIVE NEGATIVE Final    Influenza B by PCR NEGATIVE NEGATIVE Final    Comment: (NOTE) The Xpert Xpress SARS-CoV-2/FLU/RSV plus assay is intended as an aid in the diagnosis of influenza from Nasopharyngeal swab specimens and should not be used as a sole basis for treatment. Nasal washings and aspirates are unacceptable for Xpert Xpress SARS-CoV-2/FLU/RSV testing.  Fact Sheet for Patients: EntrepreneurPulse.com.au  Fact Sheet for Healthcare Providers: IncredibleEmployment.be  This test is not yet approved or cleared by the [place] FDA and has been authorized for detection and/or diagnosis of SARS-CoV-2 by FDA under an Emergency Use Authorization (EUA). This EUA will remain in effect (meaning this test can be used) for the duration of the COVID-19 declaration under Section 564(b)(1) of the Act, 21 U.S.C. section 360bbb-3(b)(1), unless the authorization is terminated or revoked.  Performed at [facility], [address] [place], [place] [address]   MRSA Next Gen by PCR, Nasal     Status: None   Collection Time: 04/29/21  5:39 AM   Specimen: Nasal Mucosa; Nasal Swab  Result Value Ref Range Status   MRSA by PCR Next Gen NOT DETECTED NOT DETECTED Final    Comment: (NOTE) The GeneXpert MRSA Assay (FDA approved for NASAL specimens only), is one component of a comprehensive MRSA colonization surveillance program. It is not intended to diagnose MRSA infection nor to guide or monitor treatment for MRSA infections. Test performance is not FDA approved in patients less than 71 years old. Performed at [facility], [address], [place], [place] [address]      Time coordinating discharge: Over 30 minutes  SIGNED:   Sidney Ace, MD  [facility] 05/01/2021, 9:17 AM Pager   If 7PM-7AM, please contact night-coverage

## 2021-05-01 NOTE — Plan of Care (Signed)
Patient discharged per MD orders at this time.All discharge instructions,medications and education reviewed with patient at bedside.Pt expressed understanding and will comply with dc instructions.follow up appointments also communicated to Pt.no verbal c/o or any ssx of distress.Pt discharged to [facility] for PT/nursing services per order. Report and AVS was called in to Ms Juluis Pitch nurse before transport.Pt was transported by two EMS personnel on a stretcher out of the unit.

## 2021-05-01 NOTE — TOC Progression Note (Addendum)
Transition of Care [facility]) - Progression Note    Patient Details  Name: Kari Mcdonald MRN: [id number] Date of Birth: 1932-05-16  Transition of Care [facility]) CM/SW [place], RN Phone Number: 05/01/2021, 10:18 AM  Clinical Narrative:   Correction:  Patient will be picked up at 230 pm by EMS  Patient discharged, will transport to [facility] today via EMS at 430 pm okay as per [place].  UHC auth in process with [facility].  COVID test ordered, results are negative.         Expected Discharge Plan and Services           Expected Discharge Date: 05/01/21                                     Social Determinants of Health (SDOH) Interventions    Readmission Risk Interventions Readmission Risk Prevention Plan 04/30/2021  Transportation Screening Complete  HRI or Home Care Consult Complete  Social Work Consult for [place] Planning/Counseling Not Complete  SW consult not completed comments RNCM assigned to patient  Palliative Care Screening Not Applicable  Medication Review Press photographer) Complete  Some recent data might be hidden

## 2021-05-01 NOTE — Progress Notes (Signed)
Occupational Therapy Treatment Patient Details Name: Kari Mcdonald MRN: [id number] DOB: 08-04-1932 Today's Date: 05/01/2021    History of present illness Kari Mcdonald is a 34yoF who presents 7/24 from [facility] after a fall and SOB. Pt admitted with CAP, pain in LUE after fall. Pt also has swollen Lt knee. PMH: COPD, CRF on 2L at night, HTN, tobacco use, osteoporosis, GERD, depression, MI, Left hip fracture s/p IM nail ORIF 2018, AMB impairment with WC for mobility needs, can typically mobilize outside for smoking of tobacco.   OT comments  Pt seen for OT tx this date to f/u re: safety with ADLs/ADL mobility. Pt requires CGA/MIN A for LB dressing including clothing mgt in standing and assist for balance. Pt requires CGA to come to standing, but then CGA/MIN A to shuffle towards recliner. She is somewhat impulsive, standing before OT was able to obtain and pass her a RW. Pt requires HHA for UE support for standing balance. Pt transferred to recliner and assisted with SETUP for self-feeding. Pt left in chair with chair alarm. All  needs met and in reach. Pt's dtr present throughout. Pt's spO2 did drop to ~84% on 2Lnc with transfer to the chair and recouped to 89-92% within ~30-45 seconds of seated rest and cues for PLB. Will continue to follow.    Follow Up Recommendations  SNF    Equipment Recommendations  None recommended by OT    Recommendations for Other Services      Precautions / Restrictions Precautions Precautions: Fall Restrictions Weight Bearing Restrictions: No       Mobility Bed Mobility Overal bed mobility: Modified Independent Bed Mobility: Supine to Sit     Supine to sit: Supervision     General bed mobility comments: Increased time/effort to perform    Transfers Overall transfer level: Needs assistance Equipment used: 1 person hand held assist Transfers: Sit to/from Stand Sit to Stand: Min guard         General transfer comment: Pt comes  to standing before OT can get AD for transfer, ultimately, hand held assist provided for pt stability/safety. Pt CTS with CGA, but requires MIN A/CGA to shuffle towards chair as well as cues for hand placement.    Balance Overall balance assessment: History of Falls;Mild deficits observed, not formally tested;Needs assistance Sitting-balance support: Bilateral upper extremity supported;Feet supported Sitting balance-Kari Mcdonald: Fair Sitting balance - Comments: alternates UE support   Standing balance support: Bilateral upper extremity supported;During functional activity Standing balance-Kari Mcdonald: Poor Standing balance comment: requires assist to sustain balance with shuffling towards chair.                           ADL either performed or assessed with clinical judgement   ADL Overall ADL's : Needs assistance/impaired                     Lower Body Dressing: Min guard;Minimal assistance;Sit to/from stand Lower Body Dressing Details (indicate cue type and reason): assistance primarily for STS balance, hand held assistance since pt attmepting to stand on her own w/o use of AD             Functional mobility during ADLs: Min guard;Minimal assistance (hand held assist to take 4-5 steps from EOB to chair)       Vision Baseline Vision/History: Wears glasses Wears Glasses: At all times Patient Visual Report:  (pt reporting vision at her baseline currently, but does endorse  periods of blurring)     Perception     Praxis      Cognition Arousal/Alertness: Awake/alert Behavior During Therapy: WFL for tasks assessed/performed Overall Cognitive Status: Difficult to assess                                 General Comments: somewhat delayed responses, appear to be more r/t hearing loss rather than processing. Pt able to follow all commands.        Exercises Other Exercises Other Exercises: Pt ed re: importance of oob activity.   Shoulder  Instructions       General Comments      Pertinent Vitals/ Pain       Pain Assessment: No/denies pain  Home Living                                          Prior Functioning/Environment              Frequency  Min 1X/week        Progress Toward Goals  OT Goals(current goals can now be found in the care plan section)  Progress towards OT goals: Progressing toward goals  Acute Rehab OT Goals Patient Stated Goal: To be able to play bingo again. OT Goal Formulation: With patient Time For Goal Achievement: 05/14/21 Potential to Achieve Goals: Good  Plan Discharge plan remains appropriate    Co-evaluation                 AM-PAC OT "6 Clicks" Daily Activity     Outcome Measure   Help from another person eating meals?: A Little Help from another person taking care of personal grooming?: A Little Help from another person toileting, which includes using toliet, bedpan, or urinal?: A Lot Help from another person bathing (including washing, rinsing, drying)?: A Lot Help from another person to put on and taking off regular upper body clothing?: A Little Help from another person to put on and taking off regular lower body clothing?: A Lot 6 Click Score: 15    End of Session Equipment Utilized During Treatment: Oxygen  OT Visit Diagnosis: Other abnormalities of gait and mobility (R26.89);Repeated falls (R29.6)   Activity Tolerance Treatment limited secondary to medical complications (Comment)   Patient Left with call bell/phone within reach;in chair;with chair alarm set;with family/visitor present   Nurse Communication          Time: 4961-1643 OT Time Calculation (min): 29 min  Charges: OT General Charges $OT Visit: 1 Visit OT Treatments $Self Care/Home Management : 8-22 mins $Therapeutic Activity: 8-22 mins  Kari Scale, MS, OTR/L ascom [phone] 05/01/21, 11:39 AM

## 2021-05-31 ENCOUNTER — Non-Acute Institutional Stay: Payer: Medicare Other | Admitting: Primary Care

## 2021-05-31 ENCOUNTER — Other Ambulatory Visit: Payer: Self-pay

## 2021-05-31 DIAGNOSIS — E43 Unspecified severe protein-calorie malnutrition: Secondary | ICD-10-CM

## 2021-05-31 DIAGNOSIS — J449 Chronic obstructive pulmonary disease, unspecified: Secondary | ICD-10-CM

## 2021-05-31 DIAGNOSIS — Z515 Encounter for palliative care: Secondary | ICD-10-CM

## 2021-05-31 DIAGNOSIS — R634 Abnormal weight loss: Secondary | ICD-10-CM

## 2021-05-31 NOTE — Progress Notes (Signed)
[facility] Palliative Care Consult Note Telephone: [phone]  Fax: [phone]   Date of encounter: 05/31/21 1:50 PM PATIENT NAME: Kari Mcdonald [address] [place] [address]   [phone] (home)  DOB: 1931-11-24 MRN: [id number] PRIMARY CARE PROVIDER:    Sofie Hartigan, MD,  [place] [address] [phone]  REFERRING PROVIDER:   Dr Merlene Pulling  RESPONSIBLE PARTY:    Contact Information     Name Relation Home Work Mobile   Joyce,Shelia Daughter [phone]     Tiwana, Chavis [phone]     Annaly, Skop    [phone]        I met face to face with patient in facility. Palliative Care was asked to follow this patient by consultation request of  Dr. Joaquin Courts to address advance care planning and complex medical decision making. This is the initial visit.                                     ASSESSMENT AND PLAN / RECOMMENDATIONS:   Advance Care Planning/Goals of Care: Goals include to maximize quality of life and symptom management. Patient/health care surrogate gave his/her permission to discuss.Our advance care planning conversation included a discussion about:    Exploration of personal, cultural or spiritual beliefs that might influence medical decisions  Identification  of a healthcare agent - Daughter and son Review  of an  advance directive document . CODE STATUS: DNR  Symptom Management/Plan:  I met with Kari Mcdonald in the nursing home. She is alert and oriented times three. We discussed her recent hospital stay and now she is returned to her facility. She states that she admitted herself four years ago for long-term care, to [facility], as she could no longer take care of her self independently. She states it made a difference to her to be able to make these decisions on her own. She is currently undergoing physical therapy status post a three day qualifying admission for gait  training and safety.   Nutrition: She states her appetite is not good and she hasn't been eating since her illness. However she states her son and daughter have been bringing her food from outside and her appetite is improving some. She endorses a weight loss down to 90 pounds and is now 111.    Smoking Cessation: She endorses a pack a day smoking habit. She said she has smoked a pack a day since the time she was 15 and does not have plans to quit. I encouraged her to consult with me if she changed her mind. She denies pain but does endorse some fatigue  likely due to resent pneumonia.   GOC: State her son and daughter are her family although she is able to make her own decisions now. She did give me permission to call her POA and introduce myself. She has advanced directives on file at the [facility] including a DNR.   Follow up Palliative Care Visit: Palliative care will continue to follow for complex medical decision making, advance care planning, and clarification of goals. Return 6 weeks or prn.  I spent 35 minutes providing this consultation. More than 50% of the time in this consultation was spent in counseling and care coordination.  PPS: 40%  HOSPICE ELIGIBILITY/DIAGNOSIS: TBD  Chief Complaint: debility, SOB  HISTORY OF PRESENT ILLNESS:  Kari Mcdonald is  a 87 y.o. year old female  with COPD, tobacco abuse, recent pneumonia, ataxia.   History obtained from review of EMR, discussion with primary team, and interview with family, facility staff/caregiver and/or Kari Mcdonald.  I reviewed available labs, medications, imaging, studies and related documents from the EMR.  Records reviewed and summarized above.   ROS   General: NAD ENMT:  endorses  dysphagia Cardiovascular: denies chest pain, endorses DOE Pulmonary: endorses ough, endorses  increased SOB Abdomen: endorses poor  appetite, denies constipation, endorses continence of bowel GU: denies dysuria, endorses  continence of urine MSK:  endorses weakness,  no falls reported Skin: denies rashes or wounds Neurological: endorses occ  pain, denies insomnia Psych: Endorses positive mood Heme/lymph/immuno: denies bruises, abnormal bleeding  Physical Exam: Current and past weights: 111.8  lbs per report Constitutional: NAD General: frail appearing, thin EYES: anicteric sclera, lids intact, no discharge  ENMT: intact hearing, oral mucous membranes moist CV: no LE edema Pulmonary:slight  increased work of breathing, no cough, room air Abdomen: intake 50%, , no ascites GU: deferred MSK: = sarcopenia, moves all extremities,   non ambulatory Skin: warm and dry, no rashes or wounds on visible skin Neuro:  ++ generalized weakness,  no cognitive impairment Psych: non-anxious affect, A and O x 3 Hem/lymph/immuno: no widespread bruising CURRENT PROBLEM LIST:  Patient Active Problem List   Diagnosis Date Noted   Protein-calorie malnutrition, severe 04/30/2021   CAP (community acquired pneumonia) 04/28/2021   Lymphedema 11/01/2018   PAD (peripheral artery disease) ([place]) 11/01/2018   Hyponatremia 07/21/2018   Closed left hip fracture ([facility]) 06/01/2017   Noninfectious diarrhea    Anemia 12/18/2016   COPD (chronic obstructive pulmonary disease) ([place]) 12/18/2016   Decreased hearing 12/18/2016   Depression 12/18/2016   GERD (gastroesophageal reflux disease) 12/18/2016   GI bleed 12/18/2016   CAD (coronary artery disease) 12/18/2016   Hypertension 12/18/2016   Knee osteoarthritis 12/18/2016   Osteoporosis 12/18/2016   Seasonal allergies 12/18/2016   Skin cancer 12/18/2016   AKI (acute kidney injury) ([place]) 11/17/2016   Chronic diarrhea    Loss of weight    Intractable cyclical vomiting with nausea    Hyperlipidemia, mixed 10/04/2014   PAST MEDICAL HISTORY:  Active Ambulatory Problems    Diagnosis Date Noted   AKI (acute kidney injury) ([place]) 11/17/2016   Chronic diarrhea    Loss of weight     Intractable cyclical vomiting with nausea    Anemia 12/18/2016   COPD (chronic obstructive pulmonary disease) ([place]) 12/18/2016   Decreased hearing 12/18/2016   Depression 12/18/2016   GERD (gastroesophageal reflux disease) 12/18/2016   GI bleed 12/18/2016   CAD (coronary artery disease) 12/18/2016   Hyperlipidemia, mixed 10/04/2014   Hypertension 12/18/2016   Knee osteoarthritis 12/18/2016   Osteoporosis 12/18/2016   Seasonal allergies 12/18/2016   Skin cancer 12/18/2016   Noninfectious diarrhea    Closed left hip fracture ([place]) 06/01/2017   Hyponatremia 07/21/2018   Lymphedema 11/01/2018   PAD (peripheral artery disease) ([place]) 11/01/2018   CAP (community acquired pneumonia) 04/28/2021   Protein-calorie malnutrition, severe 04/30/2021   Resolved Ambulatory Problems    Diagnosis Date Noted   No Resolved Ambulatory Problems   Past Medical History:  Diagnosis Date   Arthritis    Cancer ([place])    Coronary artery disease    Diverticulosis    Dysphagia    Dyspnea    Headache    History of hiatal hernia    HOH (hard of hearing)  Hyperlipidemia    Myocardial infarction ([facility])    Wears dentures    SOCIAL HX:  Social History   Tobacco Use   Smoking status: Every Day    Packs/day: 0.50    Years: 60.00    Pack years: 30.00    Types: Cigarettes   Smokeless tobacco: Never  Substance Use Topics   Alcohol use: No   FAMILY HX:  Family History  Problem Relation Age of Onset   Alzheimer's disease Mother    Lymphoma Father       ALLERGIES:  Allergies  Allergen Reactions   Penicillins Rash    Has patient had a PCN reaction causing immediate rash, facial/tongue/throat swelling, SOB or lightheadedness with hypotension: Unknown Has patient had a PCN reaction causing severe rash involving mucus membranes or skin necrosis: Unknown Has patient had a PCN reaction that required hospitalization: Unknown Has patient had a PCN reaction occurring within the last 10 years:  Unknown If all of the above answers are "NO", then may proceed with Cephalosporin use.      PERTINENT MEDICATIONS:  Outpatient Encounter Medications as of 05/31/2021  Medication Sig   acetaminophen (TYLENOL) 650 MG CR tablet Take 1,300 mg by mouth 2 (two) times daily.   albuterol (PROVENTIL HFA;VENTOLIN HFA) 108 (90 Base) MCG/ACT inhaler Inhale 2 puffs into the lungs every 6 (six) hours as needed for wheezing or shortness of breath.   amLODipine (NORVASC) 10 MG tablet Take 10 mg by mouth daily.   Baclofen 5 MG TABS Take 5 mg by mouth 2 (two) times daily.   bisacodyl (DULCOLAX) 10 MG suppository Place 10 mg rectally daily as needed for moderate constipation or severe constipation.   carboxymethylcellulose (REFRESH PLUS) 0.5 % SOLN Place 1 drop into both eyes 2 (two) times daily.   cholecalciferol (VITAMIN D3) 25 MCG (1000 UNIT) tablet Take 1,000 Units by mouth daily.   citalopram (CELEXA) 20 MG tablet Take 20 mg by mouth daily.   diclofenac Sodium (VOLTAREN) 1 % GEL Apply 4 g topically 3 (three) times daily. (Apply to bilateral knees)   divalproex (DEPAKOTE) 125 MG DR tablet Take 125 mg by mouth 2 (two) times daily. (0900 and 1400)   divalproex (DEPAKOTE) 125 MG DR tablet Take 250 mg by mouth at bedtime.   enalapril (VASOTEC) 10 MG tablet Take 10 mg by mouth 2 (two) times daily.   fluticasone (FLONASE) 50 MCG/ACT nasal spray Place 2 sprays into both nostrils daily.   Fluticasone-Salmeterol (ADVAIR) 250-50 MCG/DOSE AEPB Inhale 1 puff into the lungs every 12 (twelve) hours.   guaiFENesin (MUCINEX) 600 MG 12 hr tablet Take 1 tablet (600 mg total) by mouth 2 (two) times daily.   HYDROcodone-acetaminophen (NORCO) 10-325 MG tablet Take 1 tablet by mouth every 6 (six) hours as needed for moderate pain.   Melatonin 10 MG TABS Take 10 mg by mouth at bedtime.   mirtazapine (REMERON) 7.5 MG tablet Take 7.5 mg by mouth at bedtime.   omeprazole (PRILOSEC) 40 MG capsule Take 40 mg by mouth daily before  breakfast.   polyethylene glycol (MIRALAX / GLYCOLAX) 17 g packet Take 17 g by mouth daily.   sennosides-docusate sodium (SENOKOT-S) 8.6-50 MG tablet Take 1 tablet by mouth 2 (two) times daily.   tiotropium (SPIRIVA) 18 MCG inhalation capsule Place 18 mcg into inhaler and inhale daily.   traZODone (DESYREL) 150 MG tablet Take 150 mg by mouth at bedtime.   No facility-administered encounter medications on file as of 05/31/2021.  Thank you for the opportunity to participate in the care of Kari Mcdonald.  The palliative care team will continue to follow. Please call our office at [phone] if we can be of additional assistance.   Jason Coop, NP ,   COVID-19 PATIENT SCREENING TOOL Asked and negative response unless otherwise noted:  Have you had symptoms of covid, tested positive or been in contact with someone with symptoms/positive test in the past 5-10 days?

## 2021-06-03 ENCOUNTER — Encounter: Payer: Self-pay | Admitting: Cardiology

## 2021-06-03 ENCOUNTER — Ambulatory Visit (INDEPENDENT_AMBULATORY_CARE_PROVIDER_SITE_OTHER): Payer: Medicare Other | Admitting: Cardiology

## 2021-06-03 ENCOUNTER — Other Ambulatory Visit: Payer: Self-pay

## 2021-06-03 VITALS — BP 120/52 | HR 56 | Ht 63.0 in | Wt 106.2 lb

## 2021-06-03 DIAGNOSIS — F17200 Nicotine dependence, unspecified, uncomplicated: Secondary | ICD-10-CM

## 2021-06-03 DIAGNOSIS — I499 Cardiac arrhythmia, unspecified: Secondary | ICD-10-CM

## 2021-06-03 DIAGNOSIS — I10 Essential (primary) hypertension: Secondary | ICD-10-CM

## 2021-06-03 NOTE — Progress Notes (Signed)
Cardiology Office Note:    Date:  06/03/2021   ID:  Kari Mcdonald, DOB 1932/04/20, MRN [id number]  PCP:  Sofie Hartigan, MD   [facility] Providers Cardiologist:  None     Referring MD: Sofie Hartigan, MD   Chief Complaint  Patient presents with   Other    Afib and CHF. Meds reviewed verbally with pt.    History of Present Illness:    Kari Mcdonald is a 87 y.o. female with a hx of hypertension, current smoker x 70+yrs, COPD who presents due to atrial fibrillation.  She resides at a skilled nursing facility.  She had a fall at the nursing facility last month, 04/2021, and was sent to the emergency room.  Diagnosed with UTI and pneumonia, treated with antibiotics.  EKG during hospital admission showed sinus rhythm, PACs.  Telemetry during hospitalization was stated as being in A. fib.  Heart rate was controlled, due to advanced age and fall risk, anticoagulation was not started.  She was advised to follow-up with cardiology as outpatient.  Echocardiogram reviewed by myself performed 04/30/2021 which showed preserved ejection fraction, EF 55%, impaired relaxation.  She is doing okay, has no concerns at this time.  Denies chest pain or shortness of breath.  Past Medical History:  Diagnosis Date   Anemia    Arthritis    left knee   Cancer ([place])    Skin Cancer   COPD (chronic obstructive pulmonary disease) ([place])    Coronary artery disease    Depression    Diverticulosis    Dysphagia    Dyspnea    GERD (gastroesophageal reflux disease)    GI bleed    Headache    from eye strain   History of hiatal hernia    HOH (hard of hearing)    Hyperlipidemia    Hypertension    Myocardial infarction [facility])    2006   Osteoporosis    Wears dentures    full upper and lower    Past Surgical History:  Procedure Laterality Date   ABDOMINAL HYSTERECTOMY     APPENDECTOMY     CATARACT EXTRACTION W/PHACO Left 08/06/2016   Procedure: CATARACT EXTRACTION PHACO AND  INTRAOCULAR LENS PLACEMENT ([place]);  Surgeon: Leandrew Koyanagi, MD;  Location: [place];  Service: Ophthalmology;  Laterality: Left;   CATARACT EXTRACTION W/PHACO Right 09/10/2016   Procedure: CATARACT EXTRACTION PHACO AND INTRAOCULAR LENS PLACEMENT ([facility]);  Surgeon: Leandrew Koyanagi, MD;  Location: [place];  Service: Ophthalmology;  Laterality: Right;   CHOLECYSTECTOMY     COLONOSCOPY     COLONOSCOPY WITH PROPOFOL N/A 12/22/2016   Procedure: COLONOSCOPY WITH PROPOFOL;  Surgeon: Lucilla Lame, MD;  Location: [place];  Service: Endoscopy;  Laterality: N/A;   ESOPHAGOGASTRODUODENOSCOPY     ESOPHAGOGASTRODUODENOSCOPY (EGD) WITH PROPOFOL N/A 07/21/2016   Procedure: ESOPHAGOGASTRODUODENOSCOPY (EGD) WITH PROPOFOL;  Surgeon: Lollie Sails, MD;  Location: [facility] ENDOSCOPY;  Service: Endoscopy;  Laterality: N/A;   GANGLION CYST EXCISION Left    wrist (x2)   INTRAMEDULLARY (IM) NAIL INTERTROCHANTERIC Left 06/01/2017   Procedure: INTRAMEDULLARY (IM) NAIL INTERTROCHANTRIC;  Surgeon: Hessie Knows, MD;  Location: [facility];  Service: Orthopedics;  Laterality: Left;   PILONIDAL CYST EXCISION      Current Medications: Current Meds  Medication Sig   acetaminophen (TYLENOL) 650 MG CR tablet Take 1,300 mg by mouth 2 (two) times daily.   albuterol (PROVENTIL HFA;VENTOLIN HFA) 108 (90 Base) MCG/ACT inhaler Inhale 2 puffs into the  lungs every 6 (six) hours as needed for wheezing or shortness of breath.   amLODipine (NORVASC) 10 MG tablet Take 10 mg by mouth daily.   Baclofen 5 MG TABS Take 5 mg by mouth 2 (two) times daily.   bisacodyl (DULCOLAX) 10 MG suppository Place 10 mg rectally daily as needed for moderate constipation or severe constipation.   carboxymethylcellulose (REFRESH PLUS) 0.5 % SOLN Place 1 drop into both eyes 2 (two) times daily.   cholecalciferol (VITAMIN D3) 25 MCG (1000 UNIT) tablet Take 1,000 Units by mouth daily.   citalopram (CELEXA) 20 MG tablet Take  20 mg by mouth daily.   diclofenac Sodium (VOLTAREN) 1 % GEL Apply 4 g topically 3 (three) times daily. (Apply to bilateral knees)   divalproex (DEPAKOTE) 125 MG DR tablet Take 125 mg by mouth 2 (two) times daily. (0900 and 1400)   divalproex (DEPAKOTE) 125 MG DR tablet Take 250 mg by mouth at bedtime.   enalapril (VASOTEC) 10 MG tablet Take 10 mg by mouth daily.   fluticasone (FLONASE) 50 MCG/ACT nasal spray Place 2 sprays into both nostrils daily.   Fluticasone-Salmeterol (ADVAIR) 250-50 MCG/DOSE AEPB Inhale 1 puff into the lungs every 12 (twelve) hours.   guaiFENesin (MUCINEX) 600 MG 12 hr tablet Take 1 tablet (600 mg total) by mouth 2 (two) times daily.   HYDROcodone-acetaminophen (NORCO) 10-325 MG tablet Take 1 tablet by mouth every 6 (six) hours as needed for moderate pain.   Melatonin 10 MG TABS Take 10 mg by mouth at bedtime.   mirtazapine (REMERON) 7.5 MG tablet Take 7.5 mg by mouth at bedtime.   omeprazole (PRILOSEC) 40 MG capsule Take 40 mg by mouth daily before breakfast.   polyethylene glycol (MIRALAX / GLYCOLAX) 17 g packet Take 17 g by mouth daily.   sennosides-docusate sodium (SENOKOT-S) 8.6-50 MG tablet Take 1 tablet by mouth 2 (two) times daily.   tiotropium (SPIRIVA) 18 MCG inhalation capsule Place 18 mcg into inhaler and inhale daily.   traZODone (DESYREL) 150 MG tablet Take 150 mg by mouth at bedtime.     Allergies:   Penicillins   Social History   Socioeconomic History   Marital status: Widowed    Spouse name: Not on file   Number of children: Not on file   Years of education: Not on file   Highest education level: Not on file  Occupational History   Not on file  Tobacco Use   Smoking status: Every Day    Packs/day: 1.00    Years: 70.00    Pack years: 70.00    Types: Cigarettes   Smokeless tobacco: Never  Substance and Sexual Activity   Alcohol use: No   Drug use: No   Sexual activity: Not on file  Other Topics Concern   Not on file  Social History  Narrative   Not on file   Social Determinants of Health   Financial Resource Strain: Not on file  Food Insecurity: Not on file  Transportation Needs: Not on file  Physical Activity: Not on file  Stress: Not on file  Social Connections: Not on file     Family History: The patient's family history includes Alzheimer's disease in her mother; Lymphoma in her father.  ROS:   Please see the history of present illness.     All other systems reviewed and are negative.  EKGs/Labs/Other Studies Reviewed:    The following studies were reviewed today:   EKG:  EKG is  ordered today.  The ekg ordered today demonstrates sinus bradycardia, otherwise normal ECG.  Recent Labs: 05/01/2021: BUN 17; Creatinine, Ser 0.59; Hemoglobin 10.6; Magnesium 2.0; Platelets 299; Potassium 3.6; Sodium 139  Recent Lipid Panel    Component Value Date/Time   CHOL 123 06/16/2012 0600   TRIG 147 06/16/2012 0600   HDL 32 (L) 06/16/2012 0600   VLDL 29 06/16/2012 0600   LDLCALC 62 06/16/2012 0600     Risk Assessment/Calculations:          Physical Exam:    VS:  BP (!) 120/52 (BP Location: Right Arm, Patient Position: Sitting, Cuff Size: Normal)   Pulse (!) 56   Ht '5\' 3"'$  (1.6 m)   Wt 106 lb 4 oz (48.2 kg)   SpO2 96%   BMI 18.82 kg/m     Wt Readings from Last 3 Encounters:  06/03/21 106 lb 4 oz (48.2 kg)  04/29/21 104 lb 0.9 oz (47.2 kg)  12/02/18 168 lb (76.2 kg)     GEN: Elderly, frail looking HEENT: Hard of hearing NECK: No JVD; No carotid bruits LYMPHATICS: No lymphadenopathy CARDIAC: RRR, no murmurs, rubs, gallops RESPIRATORY: Diminished breath sounds at bases, clear anteriorly ABDOMEN: Soft, non-tender, non-distended MUSCULOSKELETAL:  No edema; No deformity  SKIN: Warm and dry NEUROLOGIC:  Alert and oriented x 3 PSYCHIATRIC:  Normal affect   ASSESSMENT:    1. Irregular heart rhythm   2. Primary hypertension   3. Smoking    PLAN:    In order of problems listed above:  EKG  from hospital admission reviewed showing sinus rhythm with PACs.  Telemetry monitoring also reviewed showing sinus rhythm with frequent PACs.  Patient has no evidence for atrial fibrillation.  Frequent PACs likely due to COPD, hypoxia.  Even if patient had atrial fibrillation, will not recommend anticoagulation due to fall risk.  EKG today shows sinus rhythm, echo 04/2021 reviewed by myself shows preserved EF, impaired relaxation. Hypertension, BP controlled.  Continue current BP meds.  Consider decreasing BP meds due to patient's age. Smoking, chronic smoker x70+ years, cessation advised ,although I doubt patient will quit anytime soon.  Follow-up as needed     Medication Adjustments/Labs and Tests Ordered: Current medicines are reviewed at length with the patient today.  Concerns regarding medicines are outlined above.  Orders Placed This Encounter  Procedures   EKG 12-Lead    No orders of the defined types were placed in this encounter.   Patient Instructions  Medication Instructions:  Your physician recommends that you continue on your current medications as directed. Please refer to the Current Medication list given to you today.  *If you need a refill on your cardiac medications before your next appointment, please call your pharmacy*   Lab Work: None ordered If you have labs (blood work) drawn today and your tests are completely normal, you will receive your results only by: [place] (if you have MyChart) OR A paper copy in the mail If you have any lab test that is abnormal or we need to change your treatment, we will call you to review the results.   Testing/Procedures: None ordered   Follow-Up: At [facility], you and your health needs are our priority.  As part of our continuing mission to provide you with exceptional heart care, we have created designated Provider Care Teams.  These Care Teams include your primary Cardiologist (physician) and Advanced  Practice Providers (APPs -  Physician Assistants and Nurse Practitioners) who all work together to provide you with  the care you need, when you need it.  We recommend signing up for the patient portal called "MyChart".  Sign up information is provided on this After Visit Summary.  MyChart is used to connect with patients for Virtual Visits (Telemedicine).  Patients are able to view lab/test results, encounter notes, upcoming appointments, etc.  Non-urgent messages can be sent to your provider as well.   To learn more about what you can do with MyChart, go to [email or web address].    Your next appointment:   Follow up as needed   The format for your next appointment:   In Person  Provider:   Kate Sable, MD   Other Instructions     Signed, Kate Sable, MD  06/03/2021 12:33 PM    [place]

## 2021-06-03 NOTE — Patient Instructions (Signed)

## 2021-07-03 ENCOUNTER — Non-Acute Institutional Stay: Payer: Medicare Other | Admitting: Primary Care

## 2021-07-03 ENCOUNTER — Other Ambulatory Visit: Payer: Self-pay

## 2021-07-25 ENCOUNTER — Other Ambulatory Visit: Payer: Self-pay

## 2021-07-25 ENCOUNTER — Non-Acute Institutional Stay: Payer: Medicare Other | Admitting: Primary Care

## 2021-07-25 DIAGNOSIS — E43 Unspecified severe protein-calorie malnutrition: Secondary | ICD-10-CM

## 2021-07-25 DIAGNOSIS — Z515 Encounter for palliative care: Secondary | ICD-10-CM

## 2021-07-25 DIAGNOSIS — R634 Abnormal weight loss: Secondary | ICD-10-CM

## 2021-07-25 DIAGNOSIS — J449 Chronic obstructive pulmonary disease, unspecified: Secondary | ICD-10-CM

## 2021-07-25 NOTE — Progress Notes (Signed)
[facility] Consult Note Telephone: [phone]  Fax: [phone]    Date of encounter: 07/25/21 2:52 PM PATIENT NAME: Kari Mcdonald [address] [place] [address]   [phone] (home)  DOB: 10/14/1931 MRN: [id number] PRIMARY CARE PROVIDER:    Sofie Hartigan, MD,  [place] [address] [phone]  REFERRING PROVIDER:   Sofie Hartigan, [place],  [place] [address] [phone]  RESPONSIBLE PARTY:    Contact Information     Name Relation Home Work Mobile   [place] Daughter [phone]     Kimberli, Winne [phone]     Meryem, Haertel    [phone]       I met face to face with patient in [facility] facility. Palliative Care was asked to follow this patient by consultation request of  Feldpausch, Chrissie Noa, MD to address advance care planning and complex medical decision making. This is a follow up visit.                                   ASSESSMENT AND PLAN / RECOMMENDATIONS:   Advance Care Planning/Goals of Care: Goals include to maximize quality of life and symptom management. Our advance care planning conversation included a discussion about:    Exploration of personal, cultural or spiritual beliefs that might influence medical decisions  CODE STATUS: DNR  Symptom Management/Plan:  Nutrition: Patient is gaining weight,  able to take herself out side to smoke. She endorses feeling well now. She's reading a mystery and states she likes mystery stories.  Staff report no concerns and that she is eating much better. Wt loss had been a problem several months ago. This appears improved with dietary supplements.  Smoking cessation instruction/counseling given:  counseled patient on the dangers of tobacco use, advised patient to stop smoking, and reviewed strategies to maximize success  Follow up Palliative Care Visit: Palliative care will continue to follow for complex  medical decision making, advance care planning, and clarification of goals. Return 12 weeks or prn.  I spent 25 minutes providing this consultation. More than 50% of the time in this consultation was spent in counseling and care coordination.  PPS: 40%  HOSPICE ELIGIBILITY/DIAGNOSIS: no  Chief Complaint: debility  HISTORY OF PRESENT ILLNESS:  Kari Mcdonald is a 87 y.o. year old female  with debility, previous weight loss, COPD, oxygen dependence.  History obtained from review of EMR, discussion with primary team, and interview with family, facility staff/caregiver and/or [name].  I reviewed available labs, medications, imaging, studies and related documents from the EMR.  Records reviewed and summarized above.   ROS  General: NAD ENMT: denies dysphagia Pulmonary: denies cough, denies increased SOB, oxygen use at hs Abdomen: endorses good appetite, denies constipation, endorses continence of bowel GU: denies dysuria, endorses continence of urine MSK:  endorses weakness,  no falls reported Skin: denies rashes or wounds Neurological: denies pain, denies insomnia Psych: Endorses positive mood Heme/lymph/immuno: denies bruises, abnormal bleeding  Physical Exam: Current and past weights:  117 lbs in 10/22, 111 in 8/22 Constitutional: NAD General: frail appearing, thin EYES: anicteric sclera, lids intact, no discharge  ENMT: intact hearing, oral mucous membranes moist, dentition intact CV: S1S2, RRR, no LE edema Pulmonary: LCTA, no increased work of breathing, no cough, oxygen use prn, current daily smoker. Abdomen: intake 75-100%,  no ascites GU: deferred MSK: +  sarcopenia, moves all extremities, non ambulatory, can transfer to chair Skin: warm and dry, no rashes or wounds on visible skin Neuro:  + generalized weakness,  no cognitive impairment Psych: non-anxious affect, A and O x 3 Hem/lymph/immuno: no widespread bruising   Thank you for the opportunity to  participate in the care of [name].  The palliative care team will continue to follow. Please call our office at [phone] if we can be of additional assistance.   Jason Coop, NP DNP, AGPCNP-BC  COVID-19 PATIENT SCREENING TOOL Asked and negative response unless otherwise noted:   Have you had symptoms of covid, tested positive or been in contact with someone with symptoms/positive test in the past 5-10 days?

## 2021-10-24 ENCOUNTER — Other Ambulatory Visit: Payer: Self-pay

## 2021-10-24 ENCOUNTER — Other Ambulatory Visit: Payer: Medicare Other | Admitting: Primary Care

## 2021-10-24 DIAGNOSIS — Z515 Encounter for palliative care: Secondary | ICD-10-CM

## 2021-10-24 DIAGNOSIS — J449 Chronic obstructive pulmonary disease, unspecified: Secondary | ICD-10-CM

## 2021-10-24 DIAGNOSIS — Z7409 Other reduced mobility: Secondary | ICD-10-CM

## 2021-10-24 NOTE — Progress Notes (Signed)
[facility] Consult Note Telephone: [phone]  Fax: [phone]    Date of encounter: 10/24/21 3:38 PM PATIENT NAME: Kari Mcdonald [address] [place] [address]   [phone] (home)  DOB: [birth year] MRN: [id number] PRIMARY CARE PROVIDER:    Sofie Hartigan, MD,  [place] [address] [phone]  REFERRING PROVIDER:   Alvester Morin, MD [place] [place],  [place] [address] [phone]  RESPONSIBLE PARTY:    Contact Information     Name Relation Home Work Mobile   [place] Daughter [phone]     Baleria, Wyman [phone]     Ruthella, Kirchman    [phone]       I met face to face with patient in [facility] facility. Palliative Care was asked to follow this patient by consultation request of  Slade-Hartman, Ivette Loyal*  to address advance care planning and complex medical decision making. This is a follow up visit.                                   ASSESSMENT AND PLAN / RECOMMENDATIONS:   Advance Care Planning/Goals of Care: Goals include to maximize quality of life and symptom management. Patient/health care surrogate gave his/her permission to discuss.Our advance care planning conversation included a discussion about:     Exploration of personal, cultural or spiritual beliefs that might influence medical decisions CODE STATUS: DNR  Symptom Management/Plan:   Nutrition: Patient is gaining weight,  able to take herself out side to smoke. She endorses feeling well now. She reports drinking 3 Ensure drinks a day. Wt loss had been a problem several months ago. This has improved with dietary supplements. No new labs  since 9/22 to review.  Mobility: Able to move in w/c to outside smoking area. Also able to transfer self off toilet and onto w/c. No falls reported.  Smoking cessation Advised patient to stop smoking and to consider cutting back. Per nurses she is  smoking about 1ppd.    Follow up Palliative Care Visit: Palliative care will continue to follow for complex medical decision making, advance care planning, and clarification of goals. Return 8 weeks or prn.  This visit was coded based on medical decision making (MDM)  PPS: 50%  HOSPICE ELIGIBILITY/DIAGNOSIS: no  Chief Complaint: COPD  HISTORY OF PRESENT ILLNESS:  Kari Mcdonald is a 88 y.o. year old female  with with COPD, previous malnutrition, debility, oxygen dependence. She was seen for a follow up today and was in a pleasant mood and states that she has been doing well and enjoying her usual crafts and activities. Self propels wheelchair with legs. Continues to smoke 1 ppd despite counseling on the dangers of smoking and request to reduce smoking frequency.   History obtained from review of EMR, discussion with primary team, and interview with family, facility staff/caregiver and/or Ms. Palm.  I reviewed available labs, medications, imaging, studies and related documents from the EMR.  Records reviewed and summarized above.   ROS  General: NAD EYES: denies vision changes, uses glasses ENMT: denies dysphagia Cardiovascular: denies chest pain, denies DOE Pulmonary: denies cough, denies increased SOB Abdomen: endorses good appetite, denies constipation, endorses continence of bowel GU: denies dysuria, endorses continence of urine MSK:  denies  increased weakness,  no falls reported Skin: denies rashes or wounds Neurological: denies pain, denies insomnia Psych: Endorses positive mood Heme/lymph/immuno: denies  bruises, abnormal bleeding  Physical Exam: Current and past weights: 122 lbs, 8 lb gain in 4 months. Constitutional: NAD General: thin EYES: anicteric sclera, lids intact, no discharge. ENMT: intact hearing, oral mucous membranes moist CV: S1S2, RRR, no LE edema Pulmonary: Upper lobes are clear to auscultation, diminished lung fiends in middle and lower lobes.  increased work of breathing with use of accessory muscles, no cough, room air Abdomen: intake 100%,  no ascites GU: deferred MSK: moderate  sarcopenia, moves all extremities, wheelchair bound. Skin: warm and dry, no rashes or wounds on visible skin Neuro:  + generalized weakness,  no cognitive impairment Psych: non-anxious affect, A and O x 3 Hem/lymph/immuno: no widespread bruising   Thank you for the opportunity to participate in the care of Ms. Mcdonald.  The palliative care team will continue to follow. Please call our office at [phone] if we can be of additional assistance.   Jason Coop, NP DNP, AGPCNP-BC  COVID-19 PATIENT SCREENING TOOL Asked and negative response unless otherwise noted:   Have you had symptoms of covid, tested positive or been in contact with someone with symptoms/positive test in the past 5-10 days?

## 2021-10-30 ENCOUNTER — Other Ambulatory Visit: Payer: Medicare Other | Admitting: Primary Care

## 2022-04-03 ENCOUNTER — Non-Acute Institutional Stay: Payer: Medicare Other | Admitting: Primary Care

## 2022-04-28 ENCOUNTER — Non-Acute Institutional Stay: Payer: Medicare Other | Admitting: Primary Care

## 2022-04-28 DIAGNOSIS — Z515 Encounter for palliative care: Secondary | ICD-10-CM

## 2022-04-28 DIAGNOSIS — J449 Chronic obstructive pulmonary disease, unspecified: Secondary | ICD-10-CM

## 2022-04-28 DIAGNOSIS — Z7409 Other reduced mobility: Secondary | ICD-10-CM

## 2022-04-28 NOTE — Progress Notes (Signed)
[facility] Consult Note Telephone: [phone]  Fax: [phone]    Date of encounter: 04/28/22 2:17 PM PATIENT NAME: Kari Mcdonald [address] [place] [address]   [phone] (home)  DOB: September 04, 1932 MRN: [id number] PRIMARY CARE PROVIDER:    Alvester Morin, [place],  [place] [address] [phone]  REFERRING PROVIDER:   Alvester Morin, [place],  [place] [address] [phone]   RESPONSIBLE PARTY:    Contact Information     Name Relation Home Work [place] Daughter [phone]     Keir, Foland [phone]     Kendelle, Schweers    [phone]       I met face to face with patient  in [facility]. Palliative Care was asked to follow this patient by consultation request of  Slade-Hartman, Ivette Loyal* to address advance care planning and complex medical decision making. This is a follow up visit.                                   ASSESSMENT AND PLAN / RECOMMENDATIONS:   Advance Care Planning/Goals of Care: Goals include to maximize quality of life and symptom management. Patient/health care surrogate gave his/her permission to discuss.Our advance care planning conversation included a discussion about:    Exploration of personal, cultural or spiritual beliefs that might influence medical decisions  CODE STATUS: DNR  Symptom Management/Plan:  Mobility: Patient usually ambulates by w/c but staff state she has been walking with PT and walker. Seems to be making some progress as she can come out into the hall a short distance. Uses w/c to go out to smoke.  COPD: Remains on oxygen in her room. Is a daily moderate smoker.  Nutrition/ GI : Weights stable at 123 lbs, eating 75-100 % of meals. Has frequent constipation and I recommend scheduling bowel program.  Follow up Palliative Care Visit: Palliative care will continue to follow for complex  medical decision making, advance care planning, and clarification of goals. Return 12 weeks or prn.  I spent 15 minutes providing this consultation. More than 50% of the time in this consultation was spent in counseling and care coordination.  PPS: 40%  HOSPICE ELIGIBILITY/DIAGNOSIS: TBD  Chief Complaint: COPD, oxygen dependence  HISTORY OF PRESENT ILLNESS:  Kari Mcdonald is a 88 y.o. year old female  with COPD, tobacco use disorder, immobility . Patient seen today to review palliative care needs to include medical decision making and advance care planning as appropriate.   History obtained from review of EMR, discussion with primary team, and interview with family, facility staff/caregiver and/or Kari Mcdonald.  I reviewed available labs, medications, imaging, studies and related documents from the EMR.  Records reviewed and summarized above.   ROS   General: NAD ENMT: denies dysphagia Pulmonary: denies cough, denies increased SOB Abdomen: endorses good appetite,  endorses  constipation, endorses continence of bowel GU: denies dysuria, endorses continence of urine MSK:  denies  increased weakness,  no falls reported Skin: denies rashes or wounds Neurological: denies pain, denies insomnia Psych: Endorses positive mood  Physical Exam: Current and past weights: 123 lbs, stable  Constitutional: NAD General: frail appearing, thin EYES: anicteric sclera, lids intact, no discharge  ENMT: intact hearing, oral mucous membranes moist, dentition intact CV: , no LE edema Pulmonary: LCTA, no increased work of breathing, no cough,  supplemental oxygen  Abdomen: intake 100%, no ascites MSK: +sarcopenia, moves all extremities, ambulatory with walker and PT Skin: warm and dry, no rashes or wounds on visible skin Neuro:  no new  generalized weakness,  mild  cognitive impairment, non-anxious affect   Thank you for the opportunity to participate in the care of Kari Mcdonald.  The palliative  care team will continue to follow. Please call our office at [phone] if we can be of additional assistance.   Jason Coop, NP DNP, AGPCNP-BC  COVID-19 PATIENT SCREENING TOOL Asked and negative response unless otherwise noted:   Have you had symptoms of covid, tested positive or been in contact with someone with symptoms/positive test in the past 5-10 days?

## 2022-09-10 ENCOUNTER — Non-Acute Institutional Stay: Payer: Medicare Other | Admitting: Nurse Practitioner

## 2022-09-10 DIAGNOSIS — Z515 Encounter for palliative care: Secondary | ICD-10-CM

## 2022-09-10 DIAGNOSIS — R5381 Other malaise: Secondary | ICD-10-CM

## 2022-09-10 DIAGNOSIS — J449 Chronic obstructive pulmonary disease, unspecified: Secondary | ICD-10-CM

## 2022-09-11 ENCOUNTER — Encounter: Payer: Self-pay | Admitting: Nurse Practitioner

## 2022-09-11 NOTE — Progress Notes (Signed)
[facility] Palliative Care Consult Note Telephone: [phone]  Fax: [phone]    Date of encounter: 09/11/22 10:10 AM PATIENT NAME: Kari Mcdonald [address] [place] [address]   [phone] (home)  DOB: October 30, 1931 MRN: [id number] PRIMARY CARE PROVIDER:   [facility] Kari Morin, MD,  [address] [place] [address] [phone]  RESPONSIBLE PARTY:    Contact Information     Name Relation Home Work [place] Daughter [phone]     Kari Mcdonald [phone]     Kari Mcdonald    [phone]      I met face to face with patient in facility. Palliative Care was asked to follow this patient by consultation request of  Kari Mcdonald Mcdonald to address advance care planning and complex medical decision making. This is a follow up visit.                                  ASSESSMENT AND PLAN / RECOMMENDATIONS:  Symptom Management/Plan: 1. Advance Care Planning;   2. Goals of Care: Goals include to maximize quality of life and symptom management. Our advance care planning conversation included a discussion about:    The value and importance of advance care planning  Exploration of personal, cultural or spiritual beliefs that might influence medical decisions  Exploration of goals of care in the event of a sudden injury or illness  Identification and preparation of a healthcare agent  Review and updating or creation of an advance directive document. 3. Palliative care encounter; Palliative care encounter; Palliative medicine team will continue to support patient, patient's family, and medical team. Visit consisted of counseling and education dealing with the complex and emotionally intense issues of symptom management and palliative care in the setting of serious and potentially life-threatening illness  4. Debility secondary to COPD, cad, continue to encourage to be oob,  mobility, activities. Fall risk, restorative exercises.   Follow up Palliative Care Visit: Palliative care will continue to follow for complex medical decision making, advance care planning, and clarification of goals. Return 4 to 8 weeks or prn.  I spent 45 minutes providing this consultation starting at 1:50 pm. More than 50% of the time in this consultation was spent in counseling and care coordination. PPS: 50% Chief Complaint: Follow up palliative consult for complex medical decision making, address goals, manage ongoing symptoms  HISTORY OF PRESENT ILLNESS:  Kari Mcdonald is a 88 y.o. year old female  with multiple medical problems including PAD, CAD, HTN, COPD, GERD, h/o GI bleed, osteoporosis, anemia, HLD, h/o hyponatremia, protein calorie malnutrition, depression. [name] resides at [facility]. [name] requires assistance with transfers, mobility, adl's. [name] feeds herself with good appetite. Staff endorses no recent hospitalization, wound, infection, falls. [name] at present is sitting in the w/c in her room working on crafts, her room-mate has a sewing machine they do crafts. [name] endorses she has been doing well, reviewed ros, pmh, social/family hx. We talked about functional abilities. We talked about daily routine, what brings her joy, quality of life. We talked about role pc in poc. Medical goals, medications, poc reviewed. Therapeutic listening, emotional support provided. [name] is currently stable. Updated staff. Questions answered.   History obtained from review of EMR, discussion with primary team, and interview with family, facility staff/caregiver and/or [name].  I reviewed available labs,  medications, imaging, studies and related documents from the EMR.  Records reviewed and summarized above.   ROS 10 point system reviewed all negative except HPI  Physical Exam: Constitutional: NAD General: frail appearing, pleasant  female EYES:  lids intact ENMT: oral mucous membranes moist CV: S1S2, RRR Pulmonary: LCTA Abdomen: normo-active BS + 4 quadrants, soft and non tender MSK: w/c dependent Skin: warm and dry Neuro:  + generalized weakness,  + cognitive impairment Psych: non-anxious affect, A and Oriented to person Thank you for the opportunity to participate in the care of [name]. Please call our office at [phone] if we can be of additional assistance.   Luceal Hollibaugh Kari Gully, NP

## 2022-10-16 ENCOUNTER — Emergency Department: Payer: Medicare Other

## 2022-10-16 ENCOUNTER — Other Ambulatory Visit: Payer: Self-pay

## 2022-10-16 ENCOUNTER — Observation Stay
Admission: EM | Admit: 2022-10-16 | Discharge: 2022-10-17 | Disposition: A | Discharge status: Home Health | Payer: Medicare Other | Attending: Internal Medicine | Admitting: Internal Medicine

## 2022-10-16 DIAGNOSIS — J449 Chronic obstructive pulmonary disease, unspecified: Secondary | ICD-10-CM | POA: Insufficient documentation

## 2022-10-16 DIAGNOSIS — Z79899 Other long term (current) drug therapy: Secondary | ICD-10-CM | POA: Diagnosis not present

## 2022-10-16 DIAGNOSIS — S72402A Unspecified fracture of lower end of left femur, initial encounter for closed fracture: Secondary | ICD-10-CM

## 2022-10-16 DIAGNOSIS — I2510 Atherosclerotic heart disease of native coronary artery without angina pectoris: Secondary | ICD-10-CM | POA: Diagnosis not present

## 2022-10-16 DIAGNOSIS — S0181XA Laceration without foreign body of other part of head, initial encounter: Secondary | ICD-10-CM | POA: Diagnosis not present

## 2022-10-16 DIAGNOSIS — Z85828 Personal history of other malignant neoplasm of skin: Secondary | ICD-10-CM | POA: Insufficient documentation

## 2022-10-16 DIAGNOSIS — I739 Peripheral vascular disease, unspecified: Secondary | ICD-10-CM | POA: Diagnosis present

## 2022-10-16 DIAGNOSIS — K219 Gastro-esophageal reflux disease without esophagitis: Secondary | ICD-10-CM | POA: Diagnosis present

## 2022-10-16 DIAGNOSIS — Z23 Encounter for immunization: Secondary | ICD-10-CM | POA: Insufficient documentation

## 2022-10-16 DIAGNOSIS — Z720 Tobacco use: Secondary | ICD-10-CM | POA: Insufficient documentation

## 2022-10-16 DIAGNOSIS — I10 Essential (primary) hypertension: Secondary | ICD-10-CM | POA: Insufficient documentation

## 2022-10-16 DIAGNOSIS — W19XXXA Unspecified fall, initial encounter: Principal | ICD-10-CM

## 2022-10-16 DIAGNOSIS — J9601 Acute respiratory failure with hypoxia: Secondary | ICD-10-CM | POA: Diagnosis not present

## 2022-10-16 DIAGNOSIS — S728X2A Other fracture of left femur, initial encounter for closed fracture: Secondary | ICD-10-CM | POA: Diagnosis not present

## 2022-10-16 DIAGNOSIS — S72002A Fracture of unspecified part of neck of left femur, initial encounter for closed fracture: Secondary | ICD-10-CM | POA: Diagnosis present

## 2022-10-16 DIAGNOSIS — W06XXXA Fall from bed, initial encounter: Secondary | ICD-10-CM | POA: Insufficient documentation

## 2022-10-16 DIAGNOSIS — F17210 Nicotine dependence, cigarettes, uncomplicated: Secondary | ICD-10-CM | POA: Diagnosis not present

## 2022-10-16 DIAGNOSIS — F32A Depression, unspecified: Secondary | ICD-10-CM | POA: Diagnosis present

## 2022-10-16 DIAGNOSIS — E43 Unspecified severe protein-calorie malnutrition: Secondary | ICD-10-CM | POA: Diagnosis not present

## 2022-10-16 DIAGNOSIS — E782 Mixed hyperlipidemia: Secondary | ICD-10-CM | POA: Diagnosis present

## 2022-10-16 DIAGNOSIS — M2578 Osteophyte, vertebrae: Secondary | ICD-10-CM | POA: Diagnosis not present

## 2022-10-16 LAB — URINALYSIS, ROUTINE W REFLEX MICROSCOPIC
Bilirubin Urine: NEGATIVE
Glucose, UA: NEGATIVE mg/dL
Hgb urine dipstick: NEGATIVE
Ketones, ur: NEGATIVE mg/dL
Leukocytes,Ua: NEGATIVE
Nitrite: NEGATIVE
Protein, ur: NEGATIVE mg/dL
Specific Gravity, Urine: 1.009 (ref 1.005–1.030)
pH: 7 (ref 5.0–8.0)

## 2022-10-16 LAB — BASIC METABOLIC PANEL
Anion gap: 8 (ref 5–15)
BUN: 20 mg/dL (ref 8–23)
CO2: 28 mmol/L (ref 22–32)
Calcium: 9.8 mg/dL (ref 8.9–10.3)
Chloride: 98 mmol/L (ref 98–111)
Creatinine, Ser: 0.75 mg/dL (ref 0.44–1.00)
GFR, Estimated: >60 mL/min (ref >60)
Glucose, Bld: 113 mg/dL — ABNORMAL HIGH (ref 70–99)
Potassium: 4.7 mmol/L (ref 3.5–5.1)
Sodium: 134 mmol/L — ABNORMAL LOW (ref 135–145)

## 2022-10-16 LAB — CBC
HCT: 36.9 % (ref 36.0–46.0)
Hemoglobin: 11.5 g/dL — ABNORMAL LOW (ref 12.0–15.0)
MCH: 27.6 pg (ref 26.0–34.0)
MCHC: 31.2 g/dL (ref 30.0–36.0)
MCV: 88.5 fL (ref 80.0–100.0)
Platelets: 223 10*3/uL (ref 150–400)
RBC: 4.17 MIL/uL (ref 3.87–5.11)
RDW: 14.8 % (ref 11.5–15.5)
WBC: 8.9 10*3/uL (ref 4.0–10.5)
nRBC: 0 % (ref 0.0–0.2)

## 2022-10-16 MED ORDER — ACETAMINOPHEN 325 MG PO TABS: 650.0000 mg | ORAL_TABLET | Freq: Four times a day (QID) | ORAL | Status: DC | PRN

## 2022-10-16 MED ORDER — NICOTINE 21 MG/24HR TD PT24: 21.0000 mg | MEDICATED_PATCH | Freq: Every day | TRANSDERMAL | Status: DC | PRN

## 2022-10-16 MED ORDER — DIVALPROEX SODIUM 250 MG PO DR TAB
250.0000 mg | DELAYED_RELEASE_TABLET | Freq: Every day | ORAL | Status: DC
  Administered 2022-10-16: 250 mg via ORAL
  Filled 2022-10-16 (×2): qty 1

## 2022-10-16 MED ORDER — ACETAMINOPHEN 650 MG RE SUPP: 650.0000 mg | Freq: Four times a day (QID) | RECTAL | Status: DC | PRN

## 2022-10-16 MED ORDER — HYDROCODONE-ACETAMINOPHEN 5-325 MG PO TABS
1.0000 | ORAL_TABLET | Freq: Four times a day (QID) | ORAL | Status: AC | PRN
  Administered 2022-10-16 – 2022-10-17 (×4): 1 via ORAL
  Filled 2022-10-16 (×4): qty 1

## 2022-10-16 MED ORDER — MORPHINE SULFATE (PF) 2 MG/ML IV SOLN
2.0000 mg | Freq: Once | INTRAVENOUS | Status: DC
  Filled 2022-10-16: qty 1

## 2022-10-16 MED ORDER — DIVALPROEX SODIUM 125 MG PO DR TAB
125.0000 mg | DELAYED_RELEASE_TABLET | Freq: Two times a day (BID) | ORAL | Status: DC
  Administered 2022-10-16 – 2022-10-17 (×3): 125 mg via ORAL
  Filled 2022-10-16 (×3): qty 1

## 2022-10-16 MED ORDER — VITAMIN D 25 MCG (1000 UNIT) PO TABS
1000.0000 [IU] | ORAL_TABLET | Freq: Every day | ORAL | Status: DC
  Administered 2022-10-17: 1000 [IU] via ORAL
  Filled 2022-10-16: qty 1

## 2022-10-16 MED ORDER — OXYCODONE-ACETAMINOPHEN 5-325 MG PO TABS
1.0000 | ORAL_TABLET | Freq: Once | ORAL | Status: AC
  Administered 2022-10-16: 1 via ORAL
  Filled 2022-10-16: qty 1

## 2022-10-16 MED ORDER — ALBUTEROL SULFATE (2.5 MG/3ML) 0.083% IN NEBU: 3.0000 mL | INHALATION_SOLUTION | Freq: Four times a day (QID) | RESPIRATORY_TRACT | Status: DC | PRN

## 2022-10-16 MED ORDER — POLYETHYLENE GLYCOL 3350 17 G PO PACK: 17.0000 g | PACK | Freq: Every day | ORAL | Status: DC | PRN

## 2022-10-16 MED ORDER — ENALAPRIL MALEATE 10 MG PO TABS
10.0000 mg | ORAL_TABLET | Freq: Every day | ORAL | Status: DC
  Administered 2022-10-17: 10 mg via ORAL
  Filled 2022-10-16: qty 1

## 2022-10-16 MED ORDER — TETANUS-DIPHTH-ACELL PERTUSSIS 5-2.5-18.5 LF-MCG/0.5 IM SUSY
0.5000 mL | PREFILLED_SYRINGE | Freq: Once | INTRAMUSCULAR | Status: AC
  Administered 2022-10-16: 0.5 mL via INTRAMUSCULAR
  Filled 2022-10-16: qty 0.5

## 2022-10-16 MED ORDER — MORPHINE SULFATE (PF) 2 MG/ML IV SOLN
1.0000 mg | INTRAVENOUS | Status: DC | PRN
  Administered 2022-10-16 – 2022-10-17 (×2): 1 mg via INTRAVENOUS
  Filled 2022-10-16 (×2): qty 1

## 2022-10-16 MED ORDER — TRAZODONE HCL 50 MG PO TABS: 150.0000 mg | ORAL_TABLET | Freq: Every evening | ORAL | Status: DC | PRN

## 2022-10-16 MED ORDER — ENOXAPARIN SODIUM 40 MG/0.4ML IJ SOSY
40.0000 mg | PREFILLED_SYRINGE | Freq: Every day | INTRAMUSCULAR | Status: DC
  Administered 2022-10-16: 40 mg via SUBCUTANEOUS
  Filled 2022-10-16: qty 0.4

## 2022-10-16 MED ORDER — HYDRALAZINE HCL 10 MG PO TABS: 10.0000 mg | ORAL_TABLET | Freq: Four times a day (QID) | ORAL | Status: DC | PRN

## 2022-10-16 MED ORDER — MELATONIN 5 MG PO TABS
5.0000 mg | ORAL_TABLET | Freq: Every day | ORAL | Status: DC
  Administered 2022-10-16: 5 mg via ORAL
  Filled 2022-10-16: qty 1

## 2022-10-16 MED ORDER — MIRTAZAPINE 15 MG PO TABS
7.5000 mg | ORAL_TABLET | Freq: Every day | ORAL | Status: DC
  Administered 2022-10-16: 7.5 mg via ORAL
  Filled 2022-10-16: qty 1

## 2022-10-16 MED ORDER — AMLODIPINE BESYLATE 10 MG PO TABS
10.0000 mg | ORAL_TABLET | Freq: Every day | ORAL | Status: DC
  Administered 2022-10-17: 10 mg via ORAL
  Filled 2022-10-16 (×2): qty 1

## 2022-10-16 MED ORDER — CITALOPRAM HYDROBROMIDE 10 MG PO TABS
20.0000 mg | ORAL_TABLET | Freq: Every day | ORAL | Status: DC
  Administered 2022-10-17: 20 mg via ORAL
  Filled 2022-10-16: qty 2

## 2022-10-16 MED ORDER — PANTOPRAZOLE SODIUM 40 MG PO TBEC
80.0000 mg | DELAYED_RELEASE_TABLET | Freq: Every day | ORAL | Status: DC
  Administered 2022-10-17: 80 mg via ORAL
  Filled 2022-10-16: qty 2

## 2022-10-16 MED ORDER — SENNOSIDES-DOCUSATE SODIUM 8.6-50 MG PO TABS: 1.0000 | ORAL_TABLET | Freq: Two times a day (BID) | ORAL | Status: DC | PRN

## 2022-10-16 NOTE — Hospital Course (Addendum)
[name] is a 89 year old female with history of depression, hypertension, anxiety, GERD, history of COPD, CAD, PAD, anemia, current tobacco user, who presents to the ED for chief concern of falling out of bed in the AM, while trying to get out of bed, she fell on her face hitting the floor.  She denies loss of consciousness.  Experience severe left leg pain following the fall.  Patient is a long-term resident at [facility], at baseline can usually transfer to a wheelchair and take herself to the bathroom.  Initial vitals in the emergency department showed temperature 97.8, respirate 2020, heart rate 72, blood pressure 159/63, SpO2 of 94% on room air.  Serum sodium is 134, potassium 4.7, chloride 98, bicarb 28, BUN of 20, serum creatinine of 0.75, EGFR greater than 60, nonfasting blood glucose 113, WBC 8.9, hemoglobin 11.5, platelets of 223. UA was negative for nitrates and leukocytes. CXR with no acute abnormality, resolution of previously seen left mid and lower lobe opacities. DG left femur and left knee shows an acute nondisplaced periprosthetic fracture of distal anterior femur, at the level of the distal aspect of femoral stem.  A longitudinal fracture line lucency is seen at the far anterior femoral cortex at the level of distal femoral's prosthetic stem, and extending distally from the medial tip of the distal interlocking screw on the frontal view.  These lucencies are favored to represent the same fracture. CT head, neck and maxillofacial with no acute trauma.  Noted to have skin lacerations.  And inferior to the right orbit, no orbital injury or fracture.  No facial bone fracture.  ED treatment: Oxycodone-acetaminophen 5-325 mg p.o. one-time dose.  Morphine 2 g IV was ordered and patient declined stating that she was currently not in pain.  Orthopedic surgery was consulted and they are recommending conservative management with knee immobilizer and pain control, no surgical indication  at this time.  1/12: Vital stable.  CBC with decrease of hemoglobin to 8.8.  BMP stable with mild hyponatremia at 134 which is not medically significant.  Adding supplement. Patient has chronic left knee contracture and at baseline she is wheelchair-bound.  She was able to transfer herself from bed to wheelchair but recently had 2 back-to-back falls while doing so and daughter does not think that she is capable of doing that anymore. She has advanced dementia and lives at [facility] as long-term resident. Orthopedic surgery is trying to get a special immobilizer as she cannot straighten her knee for the conventional knee immobilizer.  Family is also looking at hospice option at her facility and was talking with some agency.  Per daughter her brother is handling all that and they do not want [place] to involve any hospice while she is in the hospital and they will do it as outpatient on their own end.  Patient really wants to go back to her facility and does not want to stay in the hospital.  Daughter would also want her to go back as she is more comfortable over there and becomes very confused and sundowning in the new atmosphere due to her history of advanced underlying dementia.  Patient will need PT, OT and a nurse at her facility to help during this healing time.  She will be nonweightbearing on left lower extremity due to this recent fracture.  She need to follow-up with orthopedic surgery for further recommendations.  Norco was prescribed but nursing home staff has to be vigilant to avoid excessive sedation or dizziness.  Patient  should not be getting out of bed herself without any supervision to avoid falls.  Patient will be high risk for deterioration and mortality based on advanced age and underlying comorbidities and with these recent fractures.

## 2022-10-16 NOTE — ED Notes (Addendum)
Ermalinda Barrios RN and this RN completed I&O cath. Pt tolerated well. Pt's home briefs were soaked with urine. Peri care provided and briefs changed. Pt repositioned on stretcher. Stretcher locked low, rail up. Family remains at bedside. L thigh and knee swelling noted.

## 2022-10-16 NOTE — ED Notes (Addendum)
Pt to room; assisted from wheelchair to stretcher; denies dizziness and nausea; pt c/o L knee pain; states can move it but not without inc pain; denies hip pain; pt hit face on trashcan when she fell; pt has bruise and swelling to R eye; pt currently A&Ox4. Pt's resp reg/unlabored, skin dry and sitting calmly. Call bell within reach, stretcher locked low, rail up, pt on monitor.

## 2022-10-16 NOTE — ED Triage Notes (Signed)
Pt presents to ED with c/o of falling out of bed this morning. No LOC noted. Pt has lac to forehead and lac to L knee, EMS states this is normally swollen. No blood thinner use noted. Pt is A&OX4.

## 2022-10-16 NOTE — ED Notes (Signed)
Pt states would prefer I&O cath rather than turning to use bedpan as states thinks she can provide urine sample now. Will collect I&O cath kit momentarily.

## 2022-10-16 NOTE — ED Notes (Signed)
Pt placed on 2L [place] due to O2 saturation

## 2022-10-16 NOTE — ED Notes (Signed)
Pt unable to straighten knee for knee immobilizer. DO notified.

## 2022-10-16 NOTE — ED Notes (Signed)
Called secretary for transport to the floor. 

## 2022-10-16 NOTE — Assessment & Plan Note (Signed)
-   Dietitian has been consulted ?

## 2022-10-16 NOTE — Assessment & Plan Note (Signed)
-   Patient is not ready to quit - Nicotine patch ordered for nicotine craving

## 2022-10-16 NOTE — TOC Initial Note (Signed)
Transition of Care [facility]) - Initial/Assessment Note    Patient Details  Name: Kari Mcdonald MRN: [id number] Date of Birth: [birth year]  Transition of Care [facility]) CM/SW Contact:    Shelbie Hutching, RN Phone Number: 10/16/2022, 4:29 PM  Clinical Narrative:                 Received a call from [facility] with [facility], he reports that patient is a long term care patient at [facility].  He says that patient can usually transfer to a wheelchair and take herself to the bathroom.  Expected Discharge Plan: Skilled Nursing Facility Barriers to Discharge: Continued Medical Work up   Patient Goals and CMS Choice            Expected Discharge Plan and Services   Discharge Planning Services: CM Consult Post Acute Care Choice: Resumption of Svcs/PTA Provider Living arrangements for the past 2 months: [place]                 DME Arranged: N/A                    Prior Living Arrangements/Services Living arrangements for the past 2 months: [place] Lives with:: Facility Resident Patient language and need for interpreter reviewed:: Yes Do you feel safe going back to the place where you live?: Yes      Need for Family Participation in Patient Care: Yes (Comment) Care giver support system in place?: Yes (comment)   Criminal Activity/Legal Involvement Pertinent to Current Situation/Hospitalization: No - Comment as needed  Activities of Daily Living      Permission Sought/Granted      Share Information with NAME: Jerilee Field     Permission granted to share info w Relationship: daughter  Permission granted to share info w Contact Information: [phone]  Emotional Assessment         Alcohol / Substance Use: Not Applicable Psych Involvement: No (comment)  Admission diagnosis:  Other fracture of left femur, initial encounter for closed fracture [facility]) [S72.8X2A] Patient Active Problem List   Diagnosis Date Noted   Other fracture of left  femur, initial encounter for closed fracture ([place]) 10/16/2022   Protein-calorie malnutrition, severe 04/30/2021   CAP (community acquired pneumonia) 04/28/2021   Lymphedema 11/01/2018   PAD (peripheral artery disease) ([place]) 11/01/2018   Hyponatremia 07/21/2018   Closed left hip fracture ([place]) 06/01/2017   Noninfectious diarrhea    Anemia 12/18/2016   COPD (chronic obstructive pulmonary disease) ([place]) 12/18/2016   Decreased hearing 12/18/2016   Depression 12/18/2016   GERD (gastroesophageal reflux disease) 12/18/2016   GI bleed 12/18/2016   CAD (coronary artery disease) 12/18/2016   Hypertension 12/18/2016   Knee osteoarthritis 12/18/2016   Osteoporosis 12/18/2016   Seasonal allergies 12/18/2016   Skin cancer 12/18/2016   AKI (acute kidney injury) ([place]) 11/17/2016   Chronic diarrhea    Loss of weight    Intractable cyclical vomiting with nausea    Hyperlipidemia, mixed 10/04/2014   PCP:  Alvester Morin, MD Pharmacy:   [place], [place] - [address] [place] [address] Phone: [phone] Fax: [phone]  [place], [place] - [address] [place] [address] Phone: [phone] Fax: [phone]     Social Determinants of Health (SDOH) Social History: SDOH Screenings   Tobacco Use: High Risk (10/16/2022)   SDOH Interventions:     Readmission Risk Interventions  04/30/2021    1:31 PM  Readmission Risk Prevention Plan  Transportation Screening Complete  HRI or Home Care Consult Complete  Social Work Consult for [place] Planning/Counseling Not Complete  SW consult not completed comments RNCM assigned to patient  Palliative Care Screening Not Applicable  Medication Review [employer]) Complete

## 2022-10-16 NOTE — Assessment & Plan Note (Signed)
-   Resumed home amlodipine 10 mg daily, enalapril 10 mg daily - Hydralazine 10 mg p.o. every 6 hours as needed for SBP greater than 175, 4 days ordered

## 2022-10-16 NOTE — H&P (Addendum)
History and Physical   Kari Mcdonald IDP:[id number] DOB: 30-Jun-1932 DOA: 10/16/2022  PCP: Alvester Morin, MD Outpatient Specialists: Dr. Garen Lah Patient coming from: home  I have personally briefly reviewed patient's old medical records in [place].  Chief Concern: fell out of bed  HPI: [name] is a 89 year old female with history of depression, hypertension, anxiety, GERD, history of COPD, CAD, PAD, anemia, current tobacco user, who presents to the ED for chief concern of falling out of bed in the AM.  Initial vitals in the emergency department showed temperature 97.8, respirate 2020, heart rate 72, blood pressure 159/63, SpO2 of 94% on room air.  Serum sodium is 134, potassium 4.7, chloride 98, bicarb 28, BUN of 20, serum creatinine of 0.75, EGFR greater than 60, nonfasting blood glucose 113, WBC 8.9, hemoglobin 11.5, platelets of 223.  UA was negative for nitrates and leukocytes.  ED treatment: Oxycodone-acetaminophen 5-325 mg p.o. one-time dose.  Morphine 2 g IV was ordered and patient declined stating that she was currently not in pain. ----------------------- At bedside, she is able to tell me her name, age, current calendar year, her kids at bedside.   She was getting up from bed this AM and she fell on her face hitting the floor. She tried to catch a trash can. She denies lost of consciousness.   She reports the pain is currently severe in her left leg. She denies chest pain, abdominal pain, dysuria, hematuria, diarrhea, cough, nausea, vomiting. She endoses shortness of breath from the pain.   Social history: She lives at [facility] nursing home. She is current tobacco user, smoking 2 packs per day. She has been smoking since age 30 and does not want to quit. She denies etoh and recreational drug use. She is retired and formerly worked as Scientist, water quality, Training and development officer.   ROS: Constitutional: no weight change, no fever ENT/Mouth: no sore throat, no  rhinorrhea Eyes: no eye pain, no vision changes Cardiovascular: no chest pain, no dyspnea,  no edema, no palpitations Respiratory: no cough, no sputum, no wheezing Gastrointestinal: no nausea, no vomiting, no diarrhea, no constipation Genitourinary: no urinary incontinence, no dysuria, no hematuria Musculoskeletal: no arthralgias, no myalgias. Left lower extremity pain Skin: no skin lesions, no pruritus, Neuro: + weakness, no loss of consciousness, no syncope Psych: no anxiety, no depression, + decrease appetite Heme/Lymph: no bruising, no bleeding  ED Course: Discussed with emergency medicine provider, patient requiring hospitalization for chief concerns of left acute nondisplaced periprosthetic fracture of the distal anterior femur, requiring pain control.  Assessment/Plan  Principal Problem:   Other fracture of left femur, initial encounter for closed fracture ([place]) Active Problems:   COPD (chronic obstructive pulmonary disease) (HCC)   Depression   GERD (gastroesophageal reflux disease)   CAD (coronary artery disease)   Hyperlipidemia, mixed   Hypertension   Closed left hip fracture (HCC)   PAD (peripheral artery disease) (HCC)   Protein-calorie malnutrition, severe   Acute respiratory failure with hypoxia (HCC)   Tobacco use   Assessment and Plan:  * Other fracture of left femur, initial encounter for closed fracture ([place]) - EDP discussed with orthopedic subspecialist who states that to place a knee immobilizer and pain control and no surgical indication at this time - Apply knee immobilizer order placed and apply ice to affected area - Fall precautions - Pain control: Hydrocodone-acetaminophen 5/325 mg p.o. every 6 hours as needed for moderate pain, 4 doses ordered; morphine 1 mg IV every 4 hours as  needed for severe pain, 4 doses ordered - PT, OT, TOC consulted - Admit to MedSurg, inpatient  Tobacco use - Patient is not ready to quit - Nicotine patch ordered for  nicotine craving  Acute respiratory failure with hypoxia ([place]) - Patient oxygen desatted to 80s on RA post morphine IV administration - I advised nursing to place patient on oxygen nasal cannula to maintain SpO2 greater than 92% when awake 90% when asleep  Protein-calorie malnutrition, severe - Dietitian has been consulted  Hypertension - Resumed home amlodipine 10 mg daily, enalapril 10 mg daily - Hydralazine 10 mg p.o. every 6 hours as needed for SBP greater than 175, 4 days ordered  Depression - Resumed home citalopram 20 mg daily, mirtazapine 7.5 mg nightly - Trazodone 150 mg p.o. nightly as needed for sleep  COPD (chronic obstructive pulmonary disease) ([place]) - Patient does not appear to be in acute exacerbation at this time  Chart reviewed.   DVT prophylaxis: Enoxaparin 40 mg subcutaneous Code Status: DNR, confirmed with patient and family at bedside Diet: Heart healthy Family Communication: Daughter, Kari Mcdonald and son, Kari Mcdonald were at bedside with patient's permission Disposition Plan: Pending clinical course Consults called: Orthopedic service by EDP Admission status: MedSurg, observation  Past Medical History:  Diagnosis Date   Anemia    Arthritis    left knee   Cancer ([place])    Skin Cancer   COPD (chronic obstructive pulmonary disease) ([place])    Coronary artery disease    Depression    Diverticulosis    Dysphagia    Dyspnea    GERD (gastroesophageal reflux disease)    GI bleed    Headache    from eye strain   History of hiatal hernia    HOH (hard of hearing)    Hyperlipidemia    Hypertension    Myocardial infarction ([place])    2006   Osteoporosis    Wears dentures    full upper and lower   Past Surgical History:  Procedure Laterality Date   ABDOMINAL HYSTERECTOMY     APPENDECTOMY     CATARACT EXTRACTION W/PHACO Left 08/06/2016   Procedure: CATARACT EXTRACTION PHACO AND INTRAOCULAR LENS PLACEMENT ([place]);  Surgeon: Leandrew Koyanagi, MD;  Location: [facility];  Service: Ophthalmology;  Laterality: Left;   CATARACT EXTRACTION W/PHACO Right 09/10/2016   Procedure: CATARACT EXTRACTION PHACO AND INTRAOCULAR LENS PLACEMENT ([facility]);  Surgeon: Leandrew Koyanagi, MD;  Location: [place];  Service: Ophthalmology;  Laterality: Right;   CHOLECYSTECTOMY     COLONOSCOPY     COLONOSCOPY WITH PROPOFOL N/A 12/22/2016   Procedure: COLONOSCOPY WITH PROPOFOL;  Surgeon: Lucilla Lame, MD;  Location: [facility];  Service: Endoscopy;  Laterality: N/A;   ESOPHAGOGASTRODUODENOSCOPY     ESOPHAGOGASTRODUODENOSCOPY (EGD) WITH PROPOFOL N/A 07/21/2016   Procedure: ESOPHAGOGASTRODUODENOSCOPY (EGD) WITH PROPOFOL;  Surgeon: Lollie Sails, MD;  Location: [facility] ENDOSCOPY;  Service: Endoscopy;  Laterality: N/A;   GANGLION CYST EXCISION Left    wrist (x2)   INTRAMEDULLARY (IM) NAIL INTERTROCHANTERIC Left 06/01/2017   Procedure: INTRAMEDULLARY (IM) NAIL INTERTROCHANTRIC;  Surgeon: Hessie Knows, MD;  Location: [facility];  Service: Orthopedics;  Laterality: Left;   PILONIDAL CYST EXCISION     Social History:  reports that she has been smoking cigarettes. She has a 70.00 pack-year smoking history. She has never used smokeless tobacco. She reports that she does not drink alcohol and does not use drugs.  Allergies  Allergen Reactions   Penicillins Rash    Has  patient had a PCN reaction causing immediate rash, facial/tongue/throat swelling, SOB or lightheadedness with hypotension: Unknown Has patient had a PCN reaction causing severe rash involving mucus membranes or skin necrosis: Unknown Has patient had a PCN reaction that required hospitalization: Unknown Has patient had a PCN reaction occurring within the last 10 years: Unknown If all of the above answers are "NO", then may proceed with Cephalosporin use.    Family History  Problem Relation Age of Onset   Alzheimer's disease Mother    Lymphoma Father    Family history: Family history reviewed and  not pertinent  Prior to Admission medications   Medication Sig Start Date End Date Taking? Authorizing Provider  amLODipine (NORVASC) 10 MG tablet Take 10 mg by mouth daily.   Yes [provider]  Baclofen 5 MG TABS Take 5 mg by mouth 2 (two) times daily.   Yes [provider]  carboxymethylcellulose (REFRESH PLUS) 0.5 % SOLN Place 1 drop into both eyes 2 (two) times daily.   Yes [provider]  cholecalciferol (VITAMIN D3) 25 MCG (1000 UNIT) tablet Take 1,000 Units by mouth daily.   Yes [provider]  citalopram (CELEXA) 20 MG tablet Take 20 mg by mouth daily.   Yes [provider]  divalproex (DEPAKOTE) 125 MG DR tablet Take 125 mg by mouth 2 (two) times daily. (0900 and 1400)   Yes [provider]  divalproex (DEPAKOTE) 125 MG DR tablet Take 250 mg by mouth at bedtime.   Yes [provider]  EARWAX REMOVAL 6.5 % OTIC solution SMARTSIG:In Ear(s) 09/19/22  Yes [provider]  enalapril (VASOTEC) 10 MG tablet Take 10 mg by mouth daily. Hold for SBP < 110 and pulse < 60, call provider   Yes [provider]  Fluticasone-Salmeterol (ADVAIR) 250-50 MCG/DOSE AEPB Inhale 1 puff into the lungs every 12 (twelve) hours.   Yes [provider]  latanoprost (XALATAN) 0.005 % ophthalmic solution Place 1 drop into both eyes at bedtime. 04/15/22  Yes [provider]  Liniments (DEEP BLUE RELIEF) GEL Apply topically. 06/26/22  Yes [provider]  mirtazapine (REMERON) 7.5 MG tablet Take 7.5 mg by mouth at bedtime.   Yes [provider]  omeprazole (PRILOSEC) 40 MG capsule Take 40 mg by mouth daily before breakfast.   Yes [provider]  SYSTANE PRESERVATIVE FREE 0.4-0.3 % SOLN Apply 1 drop to eye 2 (two) times daily. 03/11/22  Yes [provider]  tiotropium (SPIRIVA) 18 MCG inhalation capsule Place 18 mcg into inhaler and inhale daily.   Yes [provider]   acetaminophen (TYLENOL) 650 MG CR tablet Take 1,300 mg by mouth 2 (two) times daily.    [provider]  albuterol (PROVENTIL HFA;VENTOLIN HFA) 108 (90 Base) MCG/ACT inhaler Inhale 2 puffs into the lungs every 6 (six) hours as needed for wheezing or shortness of breath.    [provider]  cloNIDine (CATAPRES) 0.1 MG tablet Take 0.1 mg by mouth daily.    [provider]  diclofenac Sodium (VOLTAREN) 1 % GEL Apply 4 g topically 3 (three) times daily. (Apply to bilateral knees)    [provider]  HYDROcodone-acetaminophen (NORCO) 10-325 MG tablet Take 1 tablet by mouth every 6 (six) hours as needed for moderate pain.    [provider]  lidocaine (LIDODERM) 5 % Place 1 patch onto the skin daily.    [provider]  Melatonin 10 MG TABS Take 10 mg by mouth  at bedtime.    [provider]  Olopatadine HCl (PATADAY) 0.7 % SOLN Apply 1 drop to eye daily at 12 noon.    [provider]  polyethylene glycol (MIRALAX / GLYCOLAX) 17 g packet Take 17 g by mouth daily.    [provider]  sennosides-docusate sodium (SENOKOT-S) 8.6-50 MG tablet Take 1 tablet by mouth 2 (two) times daily.    [provider]  traZODone (DESYREL) 150 MG tablet Take 150 mg by mouth at bedtime.    [provider]   Physical Exam: Vitals:   10/16/22 1845 10/16/22 1857 10/16/22 1858 10/16/22 1900  BP:  (!) 140/70  (!) 144/65  Pulse: 90 93 83 90  Resp:  18  18  Temp:    100.3 F (37.9 C)  TempSrc:    Oral  SpO2: 92% (!) 89% 96% 97%   Constitutional: appears frail, NAD, calm, comfortable Eyes: PERRL, lids and conjunctivae normal ENMT: Mucous membranes are moist. Posterior pharynx clear of any exudate or lesions. Age-appropriate dentition. Hearing appropriate Neck: normal, supple, no masses, no thyromegaly Respiratory: clear to auscultation bilaterally, no wheezing, no crackles. Normal respiratory effort. No accessory muscle use.   Cardiovascular: Regular rate and rhythm, no murmurs / rubs / gallops. No extremity edema. 2+ pedal pulses. No carotid bruits.  Abdomen: no tenderness, no masses palpated, no hepatosplenomegaly. Bowel sounds positive.  Musculoskeletal: no clubbing / cyanosis. No joint deformity upper and lower extremities. Good ROM, no contractures, no atrophy. Normal muscle tone. Left lower extremity leg pain Skin: no rashes, lesions, ulcers. No induration. Ecchymosis of the skin surrounding the right eye, right cheek, and forehead. A small laceration is present on the forehead with tape over. Neurologic: Sensation intact. Strength 5/5 in all 4.  Psychiatric: Normal judgment and insight. Alert and oriented x 3. Normal mood.   EKG: independently reviewed, showing sinus rhythm with rate of 76, QTc 425  Chest x-ray on Admission: I personally reviewed and I agree with radiologist reading as below.  DG Chest 1 View  Result Date: 10/16/2022 CLINICAL DATA:  Fall.  Left leg pain. EXAM: CHEST  1 VIEW COMPARISON:  Chest two views 04/28/2021, AP chest 07/21/2018 FINDINGS: Cardiac silhouette is again moderately enlarged. Moderate calcification is again seen within the aortic arch. Resolution of the previously seen left mid and lower lung heterogeneous airspace opacities on 04/28/2021. The lungs now appear clear, more similar to 07/21/2018. Mild dextrocurvature of the midthoracic spine with moderate multilevel degenerative disc changes. IMPRESSION: Resolution of the previously seen left mid and lower lung heterogeneous airspace opacities. No acute cardiopulmonary process. Electronically Signed   By: Yvonne Kendall M.D.   On: 10/16/2022 14:52   DG Knee Complete 4 Views Left  Result Date: 10/16/2022 CLINICAL DATA:  Left lower leg pain after fall today. EXAM: LEFT FEMUR 2 VIEWS; LEFT KNEE - COMPLETE 4+ VIEW COMPARISON:  Left knee radiographs 04/28/2021, pelvis and left hip radiographs 06/01/2017 FINDINGS: There is diffuse  decreased bone mineralization. Long cephalomedullary nail fixation of a now remote intertrochanteric fracture, seen acutely on 06/01/2017 prior radiographs. No perihardware lucency is seen to indicate hardware failure or loosening. There is new approximate 3 mm cortical step-off at the distal anterior femur in the metadiaphyseal region, at the level of the distal aspect of the femoral stem. Associated longitudinal subcortical linear lucency. This is suspicious for a nondisplaced acute periprosthetic fracture. On oblique view of the knee there is also a longitudinal vertically-oriented lucency extending distally from the tip  of the distal femoral metadiaphyseal hardware interlocking screw, at the region of the distal femoral metadiaphyseal medial cortex, indicating an acute fracture that may represent the same for anterior fracture line seen on lateral view. Moderate patellofemoral joint space narrowing and peripheral osteophytosis. Mild-to-moderate knee joint effusion. Severe lateral compartment osteoarthritis. Moderate vascular calcifications. IMPRESSION: Acute nondisplaced periprosthetic fracture of the distal anterior femur, at the level of the distal aspect of the femoral stem. A longitudinal fracture line lucency is seen at the far anterior femoral cortex at the level of the distal femoral prosthetic stem, and extending distally from the medial tip of the distal interlocking screw (at the medial cortex of the distal femoral metadiaphysis) on frontal view. These lucencies are favored to represent the same fracture. Electronically Signed   By: Yvonne Kendall M.D.   On: 10/16/2022 14:50   DG FEMUR MIN 2 VIEWS LEFT  Result Date: 10/16/2022 CLINICAL DATA:  Left lower leg pain after fall today. EXAM: LEFT FEMUR 2 VIEWS; LEFT KNEE - COMPLETE 4+ VIEW COMPARISON:  Left knee radiographs 04/28/2021, pelvis and left hip radiographs 06/01/2017 FINDINGS: There is diffuse decreased bone mineralization. Long  cephalomedullary nail fixation of a now remote intertrochanteric fracture, seen acutely on 06/01/2017 prior radiographs. No perihardware lucency is seen to indicate hardware failure or loosening. There is new approximate 3 mm cortical step-off at the distal anterior femur in the metadiaphyseal region, at the level of the distal aspect of the femoral stem. Associated longitudinal subcortical linear lucency. This is suspicious for a nondisplaced acute periprosthetic fracture. On oblique view of the knee there is also a longitudinal vertically-oriented lucency extending distally from the tip of the distal femoral metadiaphyseal hardware interlocking screw, at the region of the distal femoral metadiaphyseal medial cortex, indicating an acute fracture that may represent the same for anterior fracture line seen on lateral view. Moderate patellofemoral joint space narrowing and peripheral osteophytosis. Mild-to-moderate knee joint effusion. Severe lateral compartment osteoarthritis. Moderate vascular calcifications. IMPRESSION: Acute nondisplaced periprosthetic fracture of the distal anterior femur, at the level of the distal aspect of the femoral stem. A longitudinal fracture line lucency is seen at the far anterior femoral cortex at the level of the distal femoral prosthetic stem, and extending distally from the medial tip of the distal interlocking screw (at the medial cortex of the distal femoral metadiaphysis) on frontal view. These lucencies are favored to represent the same fracture. Electronically Signed   By: Yvonne Kendall M.D.   On: 10/16/2022 14:50   CT HEAD WO CONTRAST  Result Date: 10/16/2022 CLINICAL DATA:  Fall from bed.  Laceration.  Forehead laceration EXAM: CT HEAD WITHOUT CONTRAST CT MAXILLOFACIAL WITHOUT CONTRAST CT CERVICAL SPINE WITHOUT CONTRAST TECHNIQUE: Multidetector CT imaging of the head, cervical spine, and maxillofacial structures were performed using the standard protocol without  intravenous contrast. Multiplanar CT image reconstructions of the cervical spine and maxillofacial structures were also generated. RADIATION DOSE REDUCTION: This exam was performed according to the departmental dose-optimization program which includes automated exposure control, adjustment of the mA and/or kV according to patient size and/or use of iterative reconstruction technique. COMPARISON:  None Available. FINDINGS: CT HEAD FINDINGS Brain: No acute intracranial hemorrhage. No focal mass lesion. No CT evidence of acute infarction. No midline shift or mass effect. No hydrocephalus. Basilar cisterns are patent. There are periventricular and subcortical white matter hypodensities. Generalized cortical atrophy. Vascular: No hyperdense vessel or unexpected calcification. Skull: Normal. Negative for fracture or focal lesion. Sinuses/Orbits: Paranasal sinuses and mastoid air  cells are clear. Orbits are clear. Other: Cutaneous laceration superior to the RIGHT orbit and inferior to the RIGHT orbit. Globes appear normal. No intraconal abnormality. CT MAXILLOFACIAL FINDINGS Osseous: No fracture or mandibular dislocation. No destructive process. Orbits: Negative. No traumatic or inflammatory finding. Sinuses: Clear. Soft tissues: Skin laceration superior and inferior to the RIGHT orbit. No orbital injury or fracture. CT CERVICAL SPINE FINDINGS Alignment: Normal alignment of the cervical vertebral bodies. Skull base and vertebrae: Normal craniocervical junction. No loss of vertebral body height or disc height. Normal facet articulation. No evidence of fracture. Soft tissues and spinal canal: No prevertebral soft tissue swelling. No perispinal or epidural hematoma. Disc levels:  Disc space narrowing and osteophytosis at C4-C5. Upper chest: Clear Other: None IMPRESSION: 1. No intracranial trauma. 2. Skin laceration superior and inferior to the RIGHT orbit. No orbital injury or fracture. 3. No facial bone fracture. 4. No  cervical spine fracture. 5. Disc osteophytic disease most severe at C4-C5. Electronically Signed   By: Suzy Bouchard M.D.   On: 10/16/2022 10:02   CT CERVICAL SPINE WO CONTRAST  Result Date: 10/16/2022 CLINICAL DATA:  Fall from bed.  Laceration.  Forehead laceration EXAM: CT HEAD WITHOUT CONTRAST CT MAXILLOFACIAL WITHOUT CONTRAST CT CERVICAL SPINE WITHOUT CONTRAST TECHNIQUE: Multidetector CT imaging of the head, cervical spine, and maxillofacial structures were performed using the standard protocol without intravenous contrast. Multiplanar CT image reconstructions of the cervical spine and maxillofacial structures were also generated. RADIATION DOSE REDUCTION: This exam was performed according to the departmental dose-optimization program which includes automated exposure control, adjustment of the mA and/or kV according to patient size and/or use of iterative reconstruction technique. COMPARISON:  None Available. FINDINGS: CT HEAD FINDINGS Brain: No acute intracranial hemorrhage. No focal mass lesion. No CT evidence of acute infarction. No midline shift or mass effect. No hydrocephalus. Basilar cisterns are patent. There are periventricular and subcortical white matter hypodensities. Generalized cortical atrophy. Vascular: No hyperdense vessel or unexpected calcification. Skull: Normal. Negative for fracture or focal lesion. Sinuses/Orbits: Paranasal sinuses and mastoid air cells are clear. Orbits are clear. Other: Cutaneous laceration superior to the RIGHT orbit and inferior to the RIGHT orbit. Globes appear normal. No intraconal abnormality. CT MAXILLOFACIAL FINDINGS Osseous: No fracture or mandibular dislocation. No destructive process. Orbits: Negative. No traumatic or inflammatory finding. Sinuses: Clear. Soft tissues: Skin laceration superior and inferior to the RIGHT orbit. No orbital injury or fracture. CT CERVICAL SPINE FINDINGS Alignment: Normal alignment of the cervical vertebral bodies. Skull  base and vertebrae: Normal craniocervical junction. No loss of vertebral body height or disc height. Normal facet articulation. No evidence of fracture. Soft tissues and spinal canal: No prevertebral soft tissue swelling. No perispinal or epidural hematoma. Disc levels:  Disc space narrowing and osteophytosis at C4-C5. Upper chest: Clear Other: None IMPRESSION: 1. No intracranial trauma. 2. Skin laceration superior and inferior to the RIGHT orbit. No orbital injury or fracture. 3. No facial bone fracture. 4. No cervical spine fracture. 5. Disc osteophytic disease most severe at C4-C5. Electronically Signed   By: Suzy Bouchard M.D.   On: 10/16/2022 10:02   CT MAXILLOFACIAL WO CONTRAST  Result Date: 10/16/2022 CLINICAL DATA:  Fall from bed.  Laceration.  Forehead laceration EXAM: CT HEAD WITHOUT CONTRAST CT MAXILLOFACIAL WITHOUT CONTRAST CT CERVICAL SPINE WITHOUT CONTRAST TECHNIQUE: Multidetector CT imaging of the head, cervical spine, and maxillofacial structures were performed using the standard protocol without intravenous contrast. Multiplanar CT image reconstructions of the cervical spine and maxillofacial  structures were also generated. RADIATION DOSE REDUCTION: This exam was performed according to the departmental dose-optimization program which includes automated exposure control, adjustment of the mA and/or kV according to patient size and/or use of iterative reconstruction technique. COMPARISON:  None Available. FINDINGS: CT HEAD FINDINGS Brain: No acute intracranial hemorrhage. No focal mass lesion. No CT evidence of acute infarction. No midline shift or mass effect. No hydrocephalus. Basilar cisterns are patent. There are periventricular and subcortical white matter hypodensities. Generalized cortical atrophy. Vascular: No hyperdense vessel or unexpected calcification. Skull: Normal. Negative for fracture or focal lesion. Sinuses/Orbits: Paranasal sinuses and mastoid air cells are clear. Orbits are  clear. Other: Cutaneous laceration superior to the RIGHT orbit and inferior to the RIGHT orbit. Globes appear normal. No intraconal abnormality. CT MAXILLOFACIAL FINDINGS Osseous: No fracture or mandibular dislocation. No destructive process. Orbits: Negative. No traumatic or inflammatory finding. Sinuses: Clear. Soft tissues: Skin laceration superior and inferior to the RIGHT orbit. No orbital injury or fracture. CT CERVICAL SPINE FINDINGS Alignment: Normal alignment of the cervical vertebral bodies. Skull base and vertebrae: Normal craniocervical junction. No loss of vertebral body height or disc height. Normal facet articulation. No evidence of fracture. Soft tissues and spinal canal: No prevertebral soft tissue swelling. No perispinal or epidural hematoma. Disc levels:  Disc space narrowing and osteophytosis at C4-C5. Upper chest: Clear Other: None IMPRESSION: 1. No intracranial trauma. 2. Skin laceration superior and inferior to the RIGHT orbit. No orbital injury or fracture. 3. No facial bone fracture. 4. No cervical spine fracture. 5. Disc osteophytic disease most severe at C4-C5. Electronically Signed   By: Suzy Bouchard M.D.   On: 10/16/2022 10:02    Labs on Admission: I have personally reviewed following labs  CBC: Recent Labs  Lab 10/16/22 0859  WBC 8.9  HGB 11.5*  HCT 36.9  MCV 88.5  PLT 161   Basic Metabolic Panel: Recent Labs  Lab 10/16/22 0859  NA 134*  K 4.7  CL 98  CO2 28  GLUCOSE 113*  BUN 20  CREATININE 0.75  CALCIUM 9.8   GFR: CrCl cannot be calculated (Unknown ideal weight.).  Urine analysis:    Component Value Date/Time   COLORURINE YELLOW (A) 10/16/2022 1353   APPEARANCEUR CLEAR (A) 10/16/2022 1353   APPEARANCEUR Clear 11/27/2013 1846   LABSPEC 1.009 10/16/2022 1353   LABSPEC 1.021 11/27/2013 1846   PHURINE 7.0 10/16/2022 1353   GLUCOSEU NEGATIVE 10/16/2022 1353   GLUCOSEU Negative 11/27/2013 1846   HGBUR NEGATIVE 10/16/2022 1353   BILIRUBINUR  NEGATIVE 10/16/2022 1353   BILIRUBINUR Negative 11/27/2013 1846   KETONESUR NEGATIVE 10/16/2022 1353   PROTEINUR NEGATIVE 10/16/2022 1353   NITRITE NEGATIVE 10/16/2022 1353   LEUKOCYTESUR NEGATIVE 10/16/2022 1353   LEUKOCYTESUR Negative 11/27/2013 1846   This document was prepared using Dragon Voice Recognition software and may include unintentional dictation errors.  Dr. Tobie Poet [facility]  If 7PM-7AM, please contact overnight-coverage provider If 7AM-7PM, please contact day coverage provider [email or web address]  10/16/2022, 7:12 PM

## 2022-10-16 NOTE — ED Notes (Signed)
Pt stable at time of departure 

## 2022-10-16 NOTE — ED Notes (Signed)
Pt's L leg appears shorter and is turned laterally in comparison to R leg; cannot tell if this is because pt won't fully straighten/relax L leg. L dorsalis pedis pulse confirmed with doppler; L foot warm; pt can move L foot. Bruises noted to pt's L leg.

## 2022-10-16 NOTE — Assessment & Plan Note (Addendum)
-   EDP discussed with orthopedic subspecialist who states that to place a knee immobilizer and pain control and no surgical indication at this time - Apply knee immobilizer order placed and apply ice to affected area - Fall precautions - Pain control: Hydrocodone-acetaminophen 5/325 mg p.o. every 6 hours as needed for moderate pain, 4 doses ordered; morphine 1 mg IV every 4 hours as needed for severe pain, 4 doses ordered - PT, OT, TOC consulted - Admit to [place], inpatient

## 2022-10-16 NOTE — Assessment & Plan Note (Signed)
-   Patient does not appear to be in acute exacerbation at this time

## 2022-10-16 NOTE — Assessment & Plan Note (Signed)
-   Resumed home citalopram 20 mg daily, mirtazapine 7.5 mg nightly - Trazodone 150 mg p.o. nightly as needed for sleep

## 2022-10-16 NOTE — ED Notes (Signed)
Provider BS notified via secure chat that pt currently requesting something for pain. Visitors to bedside.

## 2022-10-16 NOTE — ED Provider Notes (Signed)
[facility] Provider Note    Event Date/Time   First MD Initiated Contact with Patient 10/16/22 1215     (approximate)   History   Chief Complaint Fall   HPI Kari Mcdonald is a 89 y.o. female, history of COPD, GERD, CAD, hypertension, PAD, anemia, presents to the emergency department for evaluation of injury sustained from fall.  Patient states that she had a mechanical fall out of bed this morning.  Patient states that she lost balance expectantly for about and landed on her left lower extremity.  Sustained a laceration to the left lower extremity as well.  Current endorsing most of her pain in her left thigh/knee, as well as her maxillofacial region.  Denies fever/chills, chest pain, shortness of breath, abdominal pain, flank pain, nausea vomit, diarrhea, urinary symptoms, headache, vision change, hearing changes, paresthesias, or dizziness/lightheadedness.  History Limitations: No limitations.        Physical Exam  Triage Vital Signs: ED Triage Vitals  Enc Vitals Group     BP 10/16/22 0853 (!) 159/63     Pulse Rate 10/16/22 0853 (!) 118     Resp 10/16/22 0853 20     Temp 10/16/22 0853 97.8 F (36.6 C)     Temp Source 10/16/22 0853 Oral     SpO2 10/16/22 0859 94 %     Weight --      Height --      Head Circumference --      Peak Flow --      Pain Score 10/16/22 0856 10     Pain Loc --      Pain Edu? --      Excl. in [place]? --     Most recent vital signs: Vitals:   10/16/22 1400 10/16/22 1549  BP: (!) 146/74   Pulse: 85 98  Resp:    Temp:    SpO2: 95% 96%    General: Awake, NAD.  Skin: Warm, dry. No rashes or lesions.  Eyes: PERRL. Conjunctivae normal.  CV: Good peripheral perfusion.  Resp: Normal effort.  Abd: Soft, non-tender. No distention.  Neuro: At baseline. No gross neurological deficits.  Musculoskeletal: Normal ROM of all extremities.  Focused Exam: Ecchymosis present along the orbits bilaterally.  Small superficial  2 cm linear laceration between the eyebrows.  No active bleeding or discharge.  2 cm superficial laceration to the left knee.  Moderate joint swelling present.  No overlying warmth or erythema.  She does have significant tenderness along the distal femoral region.  PMS intact distally.  She is not able to ambulate on her own.  Physical Exam    ED Results / Procedures / Treatments  Labs (all labs ordered are listed, but only abnormal results are displayed) Labs Reviewed  BASIC METABOLIC PANEL - Abnormal; Notable for the following components:      Result Value   Sodium 134 (*)    Glucose, Bld 113 (*)    All other components within normal limits  CBC - Abnormal; Notable for the following components:   Hemoglobin 11.5 (*)    All other components within normal limits  URINALYSIS, ROUTINE W REFLEX MICROSCOPIC - Abnormal; Notable for the following components:   Color, Urine YELLOW (*)    APPearance CLEAR (*)    All other components within normal limits  CBG MONITORING, ED     EKG Sinus rhythm, rate of 77, right axis deviation present, no significant ST segment changes, normal QRS, no significant  QT prolongation.    RADIOLOGY  ED Provider Interpretation: I personally reviewed and interpreted these images.  Chest x-ray shows no acute cardiopulmonary process.  Left knee x-ray shows acute nondisplaced periprosthetic fracture of the distal anterior femur.  Head CT shows no acute intracranial abnormalities.  Cervical spine CT shows no acute fractures.  CT maxillofacial unremarkable.  DG Chest 1 View  Result Date: 10/16/2022 CLINICAL DATA:  Fall.  Left leg pain. EXAM: CHEST  1 VIEW COMPARISON:  Chest two views 04/28/2021, AP chest 07/21/2018 FINDINGS: Cardiac silhouette is again moderately enlarged. Moderate calcification is again seen within the aortic arch. Resolution of the previously seen left mid and lower lung heterogeneous airspace opacities on 04/28/2021. The lungs now appear clear,  more similar to 07/21/2018. Mild dextrocurvature of the midthoracic spine with moderate multilevel degenerative disc changes. IMPRESSION: Resolution of the previously seen left mid and lower lung heterogeneous airspace opacities. No acute cardiopulmonary process. Electronically Signed   By: Yvonne Kendall M.D.   On: 10/16/2022 14:52   DG Knee Complete 4 Views Left  Result Date: 10/16/2022 CLINICAL DATA:  Left lower leg pain after fall today. EXAM: LEFT FEMUR 2 VIEWS; LEFT KNEE - COMPLETE 4+ VIEW COMPARISON:  Left knee radiographs 04/28/2021, pelvis and left hip radiographs 06/01/2017 FINDINGS: There is diffuse decreased bone mineralization. Long cephalomedullary nail fixation of a now remote intertrochanteric fracture, seen acutely on 06/01/2017 prior radiographs. No perihardware lucency is seen to indicate hardware failure or loosening. There is new approximate 3 mm cortical step-off at the distal anterior femur in the metadiaphyseal region, at the level of the distal aspect of the femoral stem. Associated longitudinal subcortical linear lucency. This is suspicious for a nondisplaced acute periprosthetic fracture. On oblique view of the knee there is also a longitudinal vertically-oriented lucency extending distally from the tip of the distal femoral metadiaphyseal hardware interlocking screw, at the region of the distal femoral metadiaphyseal medial cortex, indicating an acute fracture that may represent the same for anterior fracture line seen on lateral view. Moderate patellofemoral joint space narrowing and peripheral osteophytosis. Mild-to-moderate knee joint effusion. Severe lateral compartment osteoarthritis. Moderate vascular calcifications. IMPRESSION: Acute nondisplaced periprosthetic fracture of the distal anterior femur, at the level of the distal aspect of the femoral stem. A longitudinal fracture line lucency is seen at the far anterior femoral cortex at the level of the distal femoral prosthetic  stem, and extending distally from the medial tip of the distal interlocking screw (at the medial cortex of the distal femoral metadiaphysis) on frontal view. These lucencies are favored to represent the same fracture. Electronically Signed   By: Yvonne Kendall M.D.   On: 10/16/2022 14:50   DG FEMUR MIN 2 VIEWS LEFT  Result Date: 10/16/2022 CLINICAL DATA:  Left lower leg pain after fall today. EXAM: LEFT FEMUR 2 VIEWS; LEFT KNEE - COMPLETE 4+ VIEW COMPARISON:  Left knee radiographs 04/28/2021, pelvis and left hip radiographs 06/01/2017 FINDINGS: There is diffuse decreased bone mineralization. Long cephalomedullary nail fixation of a now remote intertrochanteric fracture, seen acutely on 06/01/2017 prior radiographs. No perihardware lucency is seen to indicate hardware failure or loosening. There is new approximate 3 mm cortical step-off at the distal anterior femur in the metadiaphyseal region, at the level of the distal aspect of the femoral stem. Associated longitudinal subcortical linear lucency. This is suspicious for a nondisplaced acute periprosthetic fracture. On oblique view of the knee there is also a longitudinal vertically-oriented lucency extending distally from the tip of the  distal femoral metadiaphyseal hardware interlocking screw, at the region of the distal femoral metadiaphyseal medial cortex, indicating an acute fracture that may represent the same for anterior fracture line seen on lateral view. Moderate patellofemoral joint space narrowing and peripheral osteophytosis. Mild-to-moderate knee joint effusion. Severe lateral compartment osteoarthritis. Moderate vascular calcifications. IMPRESSION: Acute nondisplaced periprosthetic fracture of the distal anterior femur, at the level of the distal aspect of the femoral stem. A longitudinal fracture line lucency is seen at the far anterior femoral cortex at the level of the distal femoral prosthetic stem, and extending distally from the medial tip of  the distal interlocking screw (at the medial cortex of the distal femoral metadiaphysis) on frontal view. These lucencies are favored to represent the same fracture. Electronically Signed   By: Yvonne Kendall M.D.   On: 10/16/2022 14:50   CT HEAD WO CONTRAST  Result Date: 10/16/2022 CLINICAL DATA:  Fall from bed.  Laceration.  Forehead laceration EXAM: CT HEAD WITHOUT CONTRAST CT MAXILLOFACIAL WITHOUT CONTRAST CT CERVICAL SPINE WITHOUT CONTRAST TECHNIQUE: Multidetector CT imaging of the head, cervical spine, and maxillofacial structures were performed using the standard protocol without intravenous contrast. Multiplanar CT image reconstructions of the cervical spine and maxillofacial structures were also generated. RADIATION DOSE REDUCTION: This exam was performed according to the departmental dose-optimization program which includes automated exposure control, adjustment of the mA and/or kV according to patient size and/or use of iterative reconstruction technique. COMPARISON:  None Available. FINDINGS: CT HEAD FINDINGS Brain: No acute intracranial hemorrhage. No focal mass lesion. No CT evidence of acute infarction. No midline shift or mass effect. No hydrocephalus. Basilar cisterns are patent. There are periventricular and subcortical white matter hypodensities. Generalized cortical atrophy. Vascular: No hyperdense vessel or unexpected calcification. Skull: Normal. Negative for fracture or focal lesion. Sinuses/Orbits: Paranasal sinuses and mastoid air cells are clear. Orbits are clear. Other: Cutaneous laceration superior to the RIGHT orbit and inferior to the RIGHT orbit. Globes appear normal. No intraconal abnormality. CT MAXILLOFACIAL FINDINGS Osseous: No fracture or mandibular dislocation. No destructive process. Orbits: Negative. No traumatic or inflammatory finding. Sinuses: Clear. Soft tissues: Skin laceration superior and inferior to the RIGHT orbit. No orbital injury or fracture. CT CERVICAL SPINE  FINDINGS Alignment: Normal alignment of the cervical vertebral bodies. Skull base and vertebrae: Normal craniocervical junction. No loss of vertebral body height or disc height. Normal facet articulation. No evidence of fracture. Soft tissues and spinal canal: No prevertebral soft tissue swelling. No perispinal or epidural hematoma. Disc levels:  Disc space narrowing and osteophytosis at C4-C5. Upper chest: Clear Other: None IMPRESSION: 1. No intracranial trauma. 2. Skin laceration superior and inferior to the RIGHT orbit. No orbital injury or fracture. 3. No facial bone fracture. 4. No cervical spine fracture. 5. Disc osteophytic disease most severe at C4-C5. Electronically Signed   By: Suzy Bouchard M.D.   On: 10/16/2022 10:02   CT CERVICAL SPINE WO CONTRAST  Result Date: 10/16/2022 CLINICAL DATA:  Fall from bed.  Laceration.  Forehead laceration EXAM: CT HEAD WITHOUT CONTRAST CT MAXILLOFACIAL WITHOUT CONTRAST CT CERVICAL SPINE WITHOUT CONTRAST TECHNIQUE: Multidetector CT imaging of the head, cervical spine, and maxillofacial structures were performed using the standard protocol without intravenous contrast. Multiplanar CT image reconstructions of the cervical spine and maxillofacial structures were also generated. RADIATION DOSE REDUCTION: This exam was performed according to the departmental dose-optimization program which includes automated exposure control, adjustment of the mA and/or kV according to patient size and/or use of iterative reconstruction technique.  COMPARISON:  None Available. FINDINGS: CT HEAD FINDINGS Brain: No acute intracranial hemorrhage. No focal mass lesion. No CT evidence of acute infarction. No midline shift or mass effect. No hydrocephalus. Basilar cisterns are patent. There are periventricular and subcortical white matter hypodensities. Generalized cortical atrophy. Vascular: No hyperdense vessel or unexpected calcification. Skull: Normal. Negative for fracture or focal lesion.  Sinuses/Orbits: Paranasal sinuses and mastoid air cells are clear. Orbits are clear. Other: Cutaneous laceration superior to the RIGHT orbit and inferior to the RIGHT orbit. Globes appear normal. No intraconal abnormality. CT MAXILLOFACIAL FINDINGS Osseous: No fracture or mandibular dislocation. No destructive process. Orbits: Negative. No traumatic or inflammatory finding. Sinuses: Clear. Soft tissues: Skin laceration superior and inferior to the RIGHT orbit. No orbital injury or fracture. CT CERVICAL SPINE FINDINGS Alignment: Normal alignment of the cervical vertebral bodies. Skull base and vertebrae: Normal craniocervical junction. No loss of vertebral body height or disc height. Normal facet articulation. No evidence of fracture. Soft tissues and spinal canal: No prevertebral soft tissue swelling. No perispinal or epidural hematoma. Disc levels:  Disc space narrowing and osteophytosis at C4-C5. Upper chest: Clear Other: None IMPRESSION: 1. No intracranial trauma. 2. Skin laceration superior and inferior to the RIGHT orbit. No orbital injury or fracture. 3. No facial bone fracture. 4. No cervical spine fracture. 5. Disc osteophytic disease most severe at C4-C5. Electronically Signed   By: Suzy Bouchard M.D.   On: 10/16/2022 10:02   CT MAXILLOFACIAL WO CONTRAST  Result Date: 10/16/2022 CLINICAL DATA:  Fall from bed.  Laceration.  Forehead laceration EXAM: CT HEAD WITHOUT CONTRAST CT MAXILLOFACIAL WITHOUT CONTRAST CT CERVICAL SPINE WITHOUT CONTRAST TECHNIQUE: Multidetector CT imaging of the head, cervical spine, and maxillofacial structures were performed using the standard protocol without intravenous contrast. Multiplanar CT image reconstructions of the cervical spine and maxillofacial structures were also generated. RADIATION DOSE REDUCTION: This exam was performed according to the departmental dose-optimization program which includes automated exposure control, adjustment of the mA and/or kV according to  patient size and/or use of iterative reconstruction technique. COMPARISON:  None Available. FINDINGS: CT HEAD FINDINGS Brain: No acute intracranial hemorrhage. No focal mass lesion. No CT evidence of acute infarction. No midline shift or mass effect. No hydrocephalus. Basilar cisterns are patent. There are periventricular and subcortical white matter hypodensities. Generalized cortical atrophy. Vascular: No hyperdense vessel or unexpected calcification. Skull: Normal. Negative for fracture or focal lesion. Sinuses/Orbits: Paranasal sinuses and mastoid air cells are clear. Orbits are clear. Other: Cutaneous laceration superior to the RIGHT orbit and inferior to the RIGHT orbit. Globes appear normal. No intraconal abnormality. CT MAXILLOFACIAL FINDINGS Osseous: No fracture or mandibular dislocation. No destructive process. Orbits: Negative. No traumatic or inflammatory finding. Sinuses: Clear. Soft tissues: Skin laceration superior and inferior to the RIGHT orbit. No orbital injury or fracture. CT CERVICAL SPINE FINDINGS Alignment: Normal alignment of the cervical vertebral bodies. Skull base and vertebrae: Normal craniocervical junction. No loss of vertebral body height or disc height. Normal facet articulation. No evidence of fracture. Soft tissues and spinal canal: No prevertebral soft tissue swelling. No perispinal or epidural hematoma. Disc levels:  Disc space narrowing and osteophytosis at C4-C5. Upper chest: Clear Other: None IMPRESSION: 1. No intracranial trauma. 2. Skin laceration superior and inferior to the RIGHT orbit. No orbital injury or fracture. 3. No facial bone fracture. 4. No cervical spine fracture. 5. Disc osteophytic disease most severe at C4-C5. Electronically Signed   By: Suzy Bouchard M.D.   On: 10/16/2022 10:02  PROCEDURES:  Critical Care performed: N/A.  Marland Kitchen.Laceration Repair  Date/Time: 10/16/2022 4:55 PM  Performed by: Teodoro Spray, PA Authorized by: Teodoro Spray, PA   Consent:    Consent obtained:  Verbal   Consent given by:  Patient   Risks, benefits, and alternatives were discussed: yes     Risks discussed:  Infection, need for additional repair, nerve damage, pain, retained foreign body, tendon damage, vascular damage, poor wound healing and poor cosmetic result   Alternatives discussed:  No treatment Universal protocol:    Patient identity confirmed:  Verbally with patient Anesthesia:    Anesthesia method:  None Laceration details:    Location:  Face   Face location:  Forehead   Length (cm):  2   Depth (mm):  1 Exploration:    Hemostasis achieved with:  Direct pressure   Wound extent: areolar tissue not violated, fascia not violated, no foreign body, no signs of injury, no nerve damage, no tendon damage, no underlying fracture and no vascular damage   Treatment:    Amount of cleaning:  Standard   Irrigation solution:  Sterile saline   Irrigation volume:  200 ml   Irrigation method:  Pressure wash   Debridement:  None   Undermining:  None Skin repair:    Repair method:  Steri-Strips and tissue adhesive   Number of Steri-Strips:  1 Approximation:    Approximation:  Close Repair type:    Repair type:  Simple Post-procedure details:    Dressing:  Open (no dressing)   Procedure completion:  Tolerated well, no immediate complications .Marland KitchenLaceration Repair  Date/Time: 10/16/2022 4:56 PM  Performed by: Teodoro Spray, PA Authorized by: Teodoro Spray, PA   Consent:    Consent obtained:  Verbal   Consent given by:  Patient   Risks, benefits, and alternatives were discussed: yes     Risks discussed:  Infection, need for additional repair, nerve damage, poor wound healing, poor cosmetic result, pain, retained foreign body, tendon damage and vascular damage   Alternatives discussed:  No treatment Universal protocol:    Patient identity confirmed:  Verbally with patient Anesthesia:    Anesthesia method:   None Laceration details:    Location:  Leg   Leg location:  L lower leg   Length (cm):  2   Depth (mm):  1 Exploration:    Hemostasis achieved with:  Direct pressure   Wound extent: areolar tissue not violated, fascia not violated, no foreign body, no signs of injury, no nerve damage, no tendon damage, no underlying fracture and no vascular damage     Contaminated: no   Treatment:    Area cleansed with:  Saline   Amount of cleaning:  Standard   Irrigation solution:  Sterile saline   Irrigation volume:  200 ml   Irrigation method:  Pressure wash Skin repair:    Repair method:  Tissue adhesive and Steri-Strips   Number of Steri-Strips:  3 Repair type:    Repair type:  Simple Post-procedure details:    Dressing:  Open (no dressing)   Procedure completion:  Tolerated well, no immediate complications     MEDICATIONS ORDERED IN ED: Medications  HYDROcodone-acetaminophen (NORCO/VICODIN) 5-325 MG per tablet 1 tablet (1 tablet Oral Given 10/16/22 1643)  morphine (PF) 2 MG/ML injection 1 mg (has no administration in time range)  enoxaparin (LOVENOX) injection 40 mg (has no administration in time range)  hydrALAZINE (APRESOLINE) tablet 10 mg (has no administration in time  range)  amLODipine (NORVASC) tablet 10 mg (has no administration in time range)  enalapril (VASOTEC) tablet 10 mg (has no administration in time range)  citalopram (CELEXA) tablet 20 mg (has no administration in time range)  mirtazapine (REMERON) tablet 7.5 mg (has no administration in time range)  traZODone (DESYREL) tablet 150 mg (has no administration in time range)  pantoprazole (PROTONIX) EC tablet 80 mg (has no administration in time range)  senna-docusate (Senokot-S) tablet 1 tablet (has no administration in time range)  polyethylene glycol (MIRALAX / GLYCOLAX) packet 17 g (has no administration in time range)  melatonin tablet 5 mg (has no administration in time range)  divalproex (DEPAKOTE) DR tablet 125 mg  (125 mg Oral Given 10/16/22 1643)  divalproex (DEPAKOTE) DR tablet 250 mg (has no administration in time range)  cholecalciferol (VITAMIN D3) 25 MCG (1000 UNIT) tablet 1,000 Units (has no administration in time range)  albuterol (PROVENTIL) (2.5 MG/3ML) 0.083% nebulizer solution 3 mL (has no administration in time range)  Tdap (BOOSTRIX) injection 0.5 mL (has no administration in time range)  oxyCODONE-acetaminophen (PERCOCET/ROXICET) 5-325 MG per tablet 1 tablet (1 tablet Oral Given 10/16/22 1340)     IMPRESSION / MDM / ASSESSMENT AND PLAN / ED COURSE  I reviewed the triage vital signs and the nursing notes.                              Differential diagnosis includes, but is not limited to, femur fracture, ACL/PCL injury, joint effusion, epidural/subdural hematoma, concussion, laceration.   ED Course Patient appears well, but uncomfortable.  Vitals are within normal limits.  Will treat initially with oxycodone/acetaminophen.  CBC shows no leukocytosis.  Anemia present at 11.5 hemoglobin, consistent with previous values.  BMP shows no significant electrolyte abnormalities, AKI.  Urinalysis shows no evidence of infection.  Assessment/Plan Patient presents with injury sustained from mechanical fall after rolling out of bed.  She denies any preceding symptoms.  She is currently clinically stable at this time, though does have significant ecchymosis around the maxillofacial region with a superficial linear laceration between the eyebrows, as well as point tenderness along the distal femur and a 2.5 cm laceration below the knee.  I was able to successfully repair these lacerations utilizing combination of Steri-Strips and Dermabond glue.  She tolerated the procedures well.  Her x-ray did show an acute periprosthetic distal femur fracture, nondisplaced.  Spoke with on-call orthopedist, Dr. Posey Pronto, who recommended conservative management with knee immobilizer and nonweightbearing.  Spoke with the  patient and the family members about these findings and the plan.  We agreed that admission would be in her best interest for pain control and potentially PT/OT.  Spoke with the on-call hospitalist, Dr. Tobie Poet, who agreed to admission.  Patient's presentation is most consistent with acute presentation with potential threat to life or bodily function.       FINAL CLINICAL IMPRESSION(S) / ED DIAGNOSES   Final diagnoses:  Fall, initial encounter  Closed fracture of distal end of left femur, unspecified fracture morphology, initial encounter ([place])     Rx / DC Orders   ED Discharge Orders     None        Note:  This document was prepared using Dragon voice recognition software and may include unintentional dictation errors.   Teodoro Spray, [place] 10/16/22 1656    Blake Divine, MD 10/17/22 [phone]

## 2022-10-16 NOTE — Assessment & Plan Note (Addendum)
-   Patient oxygen desatted to 80s on RA post morphine IV administration - I advised nursing to place patient on oxygen nasal cannula to maintain SpO2 greater than 92% when awake 90% when asleep

## 2022-10-17 DIAGNOSIS — I25118 Atherosclerotic heart disease of native coronary artery with other forms of angina pectoris: Secondary | ICD-10-CM | POA: Diagnosis not present

## 2022-10-17 DIAGNOSIS — W19XXXA Unspecified fall, initial encounter: Secondary | ICD-10-CM | POA: Diagnosis not present

## 2022-10-17 DIAGNOSIS — S72402A Unspecified fracture of lower end of left femur, initial encounter for closed fracture: Secondary | ICD-10-CM

## 2022-10-17 DIAGNOSIS — S728X2A Other fracture of left femur, initial encounter for closed fracture: Secondary | ICD-10-CM | POA: Diagnosis not present

## 2022-10-17 DIAGNOSIS — E43 Unspecified severe protein-calorie malnutrition: Secondary | ICD-10-CM

## 2022-10-17 DIAGNOSIS — I10 Essential (primary) hypertension: Secondary | ICD-10-CM

## 2022-10-17 LAB — CBC
HCT: 27.4 % — ABNORMAL LOW (ref 36.0–46.0)
Hemoglobin: 8.8 g/dL — ABNORMAL LOW (ref 12.0–15.0)
MCH: 27.7 pg (ref 26.0–34.0)
MCHC: 32.1 g/dL (ref 30.0–36.0)
MCV: 86.2 fL (ref 80.0–100.0)
Platelets: 187 10*3/uL (ref 150–400)
RBC: 3.18 MIL/uL — ABNORMAL LOW (ref 3.87–5.11)
RDW: 14.8 % (ref 11.5–15.5)
WBC: 9 10*3/uL (ref 4.0–10.5)
nRBC: 0 % (ref 0.0–0.2)

## 2022-10-17 LAB — BASIC METABOLIC PANEL
Anion gap: 8 (ref 5–15)
BUN: 20 mg/dL (ref 8–23)
CO2: 26 mmol/L (ref 22–32)
Calcium: 9 mg/dL (ref 8.9–10.3)
Chloride: 100 mmol/L (ref 98–111)
Creatinine, Ser: 0.67 mg/dL (ref 0.44–1.00)
GFR, Estimated: >60 mL/min (ref >60)
Glucose, Bld: 107 mg/dL — ABNORMAL HIGH (ref 70–99)
Potassium: 4.3 mmol/L (ref 3.5–5.1)
Sodium: 134 mmol/L — ABNORMAL LOW (ref 135–145)

## 2022-10-17 MED ORDER — FE FUM-VIT C-VIT B12-FA 460-60-0.01-1 MG PO CAPS: 1.0000 | ORAL_CAPSULE | Freq: Every day | ORAL | 0 refills | Status: DC

## 2022-10-17 MED ORDER — HYDROCODONE-ACETAMINOPHEN 5-325 MG PO TABS: 1.0000 | ORAL_TABLET | ORAL | 0 refills | Status: AC | PRN

## 2022-10-17 MED ORDER — HYDROCODONE-ACETAMINOPHEN 5-325 MG PO TABS: 1.0000 | ORAL_TABLET | ORAL | 0 refills | Status: DC | PRN

## 2022-10-17 MED ORDER — ENSURE ENLIVE PO LIQD
237.0000 mL | Freq: Three times a day (TID) | ORAL | Status: DC
  Administered 2022-10-17: 237 mL via ORAL

## 2022-10-17 MED ORDER — ENSURE ENLIVE PO LIQD: 237.0000 mL | Freq: Three times a day (TID) | ORAL | 12 refills | Status: DC

## 2022-10-17 MED ORDER — FE FUM-VIT C-VIT B12-FA 460-60-0.01-1 MG PO CAPS
1.0000 | ORAL_CAPSULE | Freq: Every day | ORAL | Status: DC
  Administered 2022-10-17: 1 via ORAL
  Filled 2022-10-17: qty 1

## 2022-10-17 NOTE — Discharge Summary (Signed)
Physician Discharge Summary   Patient: Kari Mcdonald MRN: [id number] DOB: [birth year]  Admit date:     10/16/2022  Discharge date: 10/17/22  Discharge Physician: Lorella Nimrod   PCP: Alvester Morin, MD   Recommendations at discharge:  Please obtain CBC and BMP in 1 week Patient will need physical and Occupational Therapy at facility. She will need extra nursing care as she will not be able to transfer herself from bed to wheelchair and will be nonweightbearing on left lower extremity due to fracture. Patient is being discharged on a special knee brace as conventional knee immobilizer was not being able to apply due to chronic left knee contracture. Please ensure extra safety precautions to avoid falls. She will get benefit from involving hospice at facility, family is working on it and would like to take care on their end. Follow-up with orthopedic surgery  Discharge Diagnoses: Principal Problem:   Other fracture of left femur, initial encounter for closed fracture ([place]) Active Problems:   COPD (chronic obstructive pulmonary disease) (HCC)   Depression   GERD (gastroesophageal reflux disease)   CAD (coronary artery disease)   Hyperlipidemia, mixed   Hypertension   Closed left hip fracture (HCC)   PAD (peripheral artery disease) (HCC)   Protein-calorie malnutrition, severe   Acute respiratory failure with hypoxia (HCC)   Tobacco use   Closed fracture of left distal femur (HCC)  Resolved Problems:   * No resolved hospital problems. [facility] Course: Kari Mcdonald is a 89 year old female with history of depression, hypertension, anxiety, GERD, history of COPD, CAD, PAD, anemia, current tobacco user, who presents to the ED for chief concern of falling out of bed in the AM, while trying to get out of bed, she fell on her face hitting the floor.  She denies loss of consciousness.  Experience severe left leg pain following the fall.  Patient is a long-term  resident at [place], at baseline can usually transfer to a wheelchair and take herself to the bathroom.  Initial vitals in the emergency department showed temperature 97.8, respirate 2020, heart rate 72, blood pressure 159/63, SpO2 of 94% on room air.  Serum sodium is 134, potassium 4.7, chloride 98, bicarb 28, BUN of 20, serum creatinine of 0.75, EGFR greater than 60, nonfasting blood glucose 113, WBC 8.9, hemoglobin 11.5, platelets of 223. UA was negative for nitrates and leukocytes. CXR with no acute abnormality, resolution of previously seen left mid and lower lobe opacities. DG left femur and left knee shows an acute nondisplaced periprosthetic fracture of distal anterior femur, at the level of the distal aspect of femoral stem.  A longitudinal fracture line lucency is seen at the far anterior femoral cortex at the level of distal femoral's prosthetic stem, and extending distally from the medial tip of the distal interlocking screw on the frontal view.  These lucencies are favored to represent the same fracture. CT head, neck and maxillofacial with no acute trauma.  Noted to have skin lacerations.  And inferior to the right orbit, no orbital injury or fracture.  No facial bone fracture.  ED treatment: Oxycodone-acetaminophen 5-325 mg p.o. one-time dose.  Morphine 2 g IV was ordered and patient declined stating that she was currently not in pain.  Orthopedic surgery was consulted and they are recommending conservative management with knee immobilizer and pain control, no surgical indication at this time.  1/12: Vital stable.  CBC with decrease of hemoglobin to 8.8.  BMP stable with mild hyponatremia at 134  which is not medically significant.  Adding supplement. Patient has chronic left knee contracture and at baseline she is wheelchair-bound.  She was able to transfer herself from bed to wheelchair but recently had 2 back-to-back falls while doing so and daughter does not think that she is capable  of doing that anymore. She has advanced dementia and lives at [facility] as long-term resident. Orthopedic surgery is trying to get a special immobilizer as she cannot straighten her knee for the conventional knee immobilizer.  Family is also looking at hospice option at her facility and was talking with some agency.  Per daughter her brother is handling all that and they do not want [place] to involve any hospice while she is in the hospital and they will do it as outpatient on their own end.  Patient really wants to go back to her facility and does not want to stay in the hospital.  Daughter would also want her to go back as she is more comfortable over there and becomes very confused and sundowning in the new atmosphere due to her history of advanced underlying dementia.  Patient will need PT, OT and a nurse at her facility to help during this healing time.  She will be nonweightbearing on left lower extremity due to this recent fracture.  She need to follow-up with orthopedic surgery for further recommendations.  Norco was prescribed but nursing home staff has to be vigilant to avoid excessive sedation or dizziness.  Patient should not be getting out of bed herself without any supervision to avoid falls.  Patient will be high risk for deterioration and mortality based on advanced age and underlying comorbidities and with these recent fractures.  Assessment and Plan: * Other fracture of left femur, initial encounter for closed fracture [facility]) - EDP discussed with orthopedic subspecialist who states that to place a knee immobilizer and pain control and no surgical indication at this time - Apply knee immobilizer order placed and apply ice to affected area - Fall precautions - Pain control: Hydrocodone-acetaminophen 5/325 mg p.o. every 6 hours as needed for moderate pain, 4 doses ordered; morphine 1 mg IV every 4 hours as needed for severe pain, 4 doses ordered - PT, OT, TOC consulted - Admit to  MedSurg, inpatient  Tobacco use - Patient is not ready to quit - Nicotine patch ordered for nicotine craving  Acute respiratory failure with hypoxia ([place]) - Patient oxygen desatted to 80s on RA post morphine IV administration - I advised nursing to place patient on oxygen nasal cannula to maintain SpO2 greater than 92% when awake 90% when asleep  Protein-calorie malnutrition, severe - Dietitian has been consulted  Hypertension - Resumed home amlodipine 10 mg daily, enalapril 10 mg daily - Hydralazine 10 mg p.o. every 6 hours as needed for SBP greater than 175, 4 days ordered  Depression - Resumed home citalopram 20 mg daily, mirtazapine 7.5 mg nightly - Trazodone 150 mg p.o. nightly as needed for sleep  COPD (chronic obstructive pulmonary disease) ([place]) - Patient does not appear to be in acute exacerbation at this time    Pain control - [facility] Controlled Substance Reporting System database was reviewed. and patient was instructed, not to drive, operate heavy machinery, perform activities at heights, swimming or participation in water activities or provide baby-sitting services while on Pain, Sleep and Anxiety Medications; until their outpatient Physician has advised to do so again. Also recommended to not to take more than prescribed Pain, Sleep and Anxiety Medications.  Consultants: Orthopedic surgery Procedures performed: None Disposition: Skilled nursing facility Diet recommendation:  Discharge Diet Orders (From admission, onward)     Start     Ordered   10/17/22 0000  Diet - low sodium heart healthy        10/17/22 1405           Regular diet DISCHARGE MEDICATION: Allergies as of 10/17/2022       Reactions   Penicillins Rash   Has patient had a PCN reaction causing immediate rash, facial/tongue/throat swelling, SOB or lightheadedness with hypotension: Unknown Has patient had a PCN reaction causing severe rash involving mucus membranes or skin necrosis:  Unknown Has patient had a PCN reaction that required hospitalization: Unknown Has patient had a PCN reaction occurring within the last 10 years: Unknown If all of the above answers are "NO", then may proceed with Cephalosporin use.        Medication List     STOP taking these medications    HYDROcodone-acetaminophen 10-325 MG tablet Commonly known as: NORCO Replaced by: HYDROcodone-acetaminophen 5-325 MG tablet       TAKE these medications    acetaminophen 650 MG CR tablet Commonly known as: TYLENOL Take 1,300 mg by mouth 2 (two) times daily.   albuterol 108 (90 Base) MCG/ACT inhaler Commonly known as: VENTOLIN HFA Inhale 2 puffs into the lungs every 6 (six) hours as needed for wheezing or shortness of breath.   amLODipine 10 MG tablet Commonly known as: NORVASC Take 10 mg by mouth daily.   Baclofen 5 MG Tabs Take 5 mg by mouth 2 (two) times daily.   carboxymethylcellulose 0.5 % Soln Commonly known as: REFRESH PLUS Place 1 drop into both eyes 2 (two) times daily.   cholecalciferol 25 MCG (1000 UNIT) tablet Commonly known as: VITAMIN D3 Take 1,000 Units by mouth daily.   citalopram 20 MG tablet Commonly known as: CELEXA Take 20 mg by mouth daily.   cloNIDine 0.1 MG tablet Commonly known as: CATAPRES Take 0.1 mg by mouth daily.   Deep Blue Relief Gel Apply topically.   diclofenac Sodium 1 % Gel Commonly known as: VOLTAREN Apply 4 g topically 3 (three) times daily. (Apply to bilateral knees)   divalproex 125 MG DR tablet Commonly known as: DEPAKOTE Take 125 mg by mouth 2 (two) times daily. (0900 and 1400)   divalproex 125 MG DR tablet Commonly known as: DEPAKOTE Take 250 mg by mouth at bedtime.   Earwax Removal 6.5 % OTIC solution Generic drug: carbamide peroxide SMARTSIG:In Ear(s)   enalapril 10 MG tablet Commonly known as: VASOTEC Take 10 mg by mouth daily. Hold for SBP < 110 and pulse < 60, call provider   Fe Fum-Vit C-Vit B12-FA Caps  capsule Commonly known as: TRIGELS-F FORTE Take 1 capsule by mouth daily after breakfast. Start taking on: October 18, 2022   feeding supplement Liqd Take 237 mLs by mouth 3 (three) times daily between meals.   Fluticasone-Salmeterol 250-50 MCG/DOSE Aepb Commonly known as: ADVAIR Inhale 1 puff into the lungs every 12 (twelve) hours.   HYDROcodone-acetaminophen 5-325 MG tablet Commonly known as: NORCO/VICODIN Take 1 tablet by mouth every 4 (four) hours as needed for moderate pain. Replaces: HYDROcodone-acetaminophen 10-325 MG tablet   latanoprost 0.005 % ophthalmic solution Commonly known as: XALATAN Place 1 drop into both eyes at bedtime.   lidocaine 5 % Commonly known as: LIDODERM Place 1 patch onto the skin daily.   Melatonin 10 MG Tabs Take 10 mg by  mouth at bedtime.   mirtazapine 7.5 MG tablet Commonly known as: REMERON Take 7.5 mg by mouth at bedtime.   omeprazole 40 MG capsule Commonly known as: PRILOSEC Take 40 mg by mouth daily before breakfast.   Pataday 0.7 % Soln Generic drug: Olopatadine HCl Apply 1 drop to eye daily at 12 noon.   polyethylene glycol 17 g packet Commonly known as: MIRALAX / GLYCOLAX Take 17 g by mouth daily.   sennosides-docusate sodium 8.6-50 MG tablet Commonly known as: SENOKOT-S Take 1 tablet by mouth 2 (two) times daily.   Systane Preservative Free 0.4-0.3 % Soln Generic drug: Polyethyl Glyc-Propyl Glyc PF Apply 1 drop to eye 2 (two) times daily.   tiotropium 18 MCG inhalation capsule Commonly known as: SPIRIVA Place 18 mcg into inhaler and inhale daily.   traZODone 150 MG tablet Commonly known as: DESYREL Take 150 mg by mouth at bedtime.               Discharge Care Instructions  (From admission, onward)           Start     Ordered   10/17/22 0000  No dressing needed        10/17/22 1405            Discharge Exam: Filed Weights   10/16/22 1959  Weight: 54.9 kg   General.  Frail and malnourished  elderly lady, in no acute distress. Pulmonary.  Lungs clear bilaterally, normal respiratory effort. CV.  Regular rate and rhythm, no JVD, rub or murmur. Abdomen.  Soft, nontender, nondistended, BS positive. CNS.  Alert and oriented .  No focal neurologic deficit. Extremities.  Left knee with edema and remained in mildly flexed position.  Unable to straighten out that leg. Psychiatry.  Judgment and insight appears impaired  Condition at discharge: stable  The results of significant diagnostics from this hospitalization (including imaging, microbiology, ancillary and laboratory) are listed below for reference.   Imaging Studies: DG Chest 1 View  Result Date: 10/16/2022 CLINICAL DATA:  Fall.  Left leg pain. EXAM: CHEST  1 VIEW COMPARISON:  Chest two views 04/28/2021, AP chest 07/21/2018 FINDINGS: Cardiac silhouette is again moderately enlarged. Moderate calcification is again seen within the aortic arch. Resolution of the previously seen left mid and lower lung heterogeneous airspace opacities on 04/28/2021. The lungs now appear clear, more similar to 07/21/2018. Mild dextrocurvature of the midthoracic spine with moderate multilevel degenerative disc changes. IMPRESSION: Resolution of the previously seen left mid and lower lung heterogeneous airspace opacities. No acute cardiopulmonary process. Electronically Signed   By: Yvonne Kendall M.D.   On: 10/16/2022 14:52   DG Knee Complete 4 Views Left  Result Date: 10/16/2022 CLINICAL DATA:  Left lower leg pain after fall today. EXAM: LEFT FEMUR 2 VIEWS; LEFT KNEE - COMPLETE 4+ VIEW COMPARISON:  Left knee radiographs 04/28/2021, pelvis and left hip radiographs 06/01/2017 FINDINGS: There is diffuse decreased bone mineralization. Long cephalomedullary nail fixation of a now remote intertrochanteric fracture, seen acutely on 06/01/2017 prior radiographs. No perihardware lucency is seen to indicate hardware failure or loosening. There is new approximate 3 mm  cortical step-off at the distal anterior femur in the metadiaphyseal region, at the level of the distal aspect of the femoral stem. Associated longitudinal subcortical linear lucency. This is suspicious for a nondisplaced acute periprosthetic fracture. On oblique view of the knee there is also a longitudinal vertically-oriented lucency extending distally from the tip of the distal femoral metadiaphyseal hardware interlocking screw,  at the region of the distal femoral metadiaphyseal medial cortex, indicating an acute fracture that may represent the same for anterior fracture line seen on lateral view. Moderate patellofemoral joint space narrowing and peripheral osteophytosis. Mild-to-moderate knee joint effusion. Severe lateral compartment osteoarthritis. Moderate vascular calcifications. IMPRESSION: Acute nondisplaced periprosthetic fracture of the distal anterior femur, at the level of the distal aspect of the femoral stem. A longitudinal fracture line lucency is seen at the far anterior femoral cortex at the level of the distal femoral prosthetic stem, and extending distally from the medial tip of the distal interlocking screw (at the medial cortex of the distal femoral metadiaphysis) on frontal view. These lucencies are favored to represent the same fracture. Electronically Signed   By: Yvonne Kendall M.D.   On: 10/16/2022 14:50   DG FEMUR MIN 2 VIEWS LEFT  Result Date: 10/16/2022 CLINICAL DATA:  Left lower leg pain after fall today. EXAM: LEFT FEMUR 2 VIEWS; LEFT KNEE - COMPLETE 4+ VIEW COMPARISON:  Left knee radiographs 04/28/2021, pelvis and left hip radiographs 06/01/2017 FINDINGS: There is diffuse decreased bone mineralization. Long cephalomedullary nail fixation of a now remote intertrochanteric fracture, seen acutely on 06/01/2017 prior radiographs. No perihardware lucency is seen to indicate hardware failure or loosening. There is new approximate 3 mm cortical step-off at the distal anterior femur in  the metadiaphyseal region, at the level of the distal aspect of the femoral stem. Associated longitudinal subcortical linear lucency. This is suspicious for a nondisplaced acute periprosthetic fracture. On oblique view of the knee there is also a longitudinal vertically-oriented lucency extending distally from the tip of the distal femoral metadiaphyseal hardware interlocking screw, at the region of the distal femoral metadiaphyseal medial cortex, indicating an acute fracture that may represent the same for anterior fracture line seen on lateral view. Moderate patellofemoral joint space narrowing and peripheral osteophytosis. Mild-to-moderate knee joint effusion. Severe lateral compartment osteoarthritis. Moderate vascular calcifications. IMPRESSION: Acute nondisplaced periprosthetic fracture of the distal anterior femur, at the level of the distal aspect of the femoral stem. A longitudinal fracture line lucency is seen at the far anterior femoral cortex at the level of the distal femoral prosthetic stem, and extending distally from the medial tip of the distal interlocking screw (at the medial cortex of the distal femoral metadiaphysis) on frontal view. These lucencies are favored to represent the same fracture. Electronically Signed   By: Yvonne Kendall M.D.   On: 10/16/2022 14:50   CT HEAD WO CONTRAST  Result Date: 10/16/2022 CLINICAL DATA:  Fall from bed.  Laceration.  Forehead laceration EXAM: CT HEAD WITHOUT CONTRAST CT MAXILLOFACIAL WITHOUT CONTRAST CT CERVICAL SPINE WITHOUT CONTRAST TECHNIQUE: Multidetector CT imaging of the head, cervical spine, and maxillofacial structures were performed using the standard protocol without intravenous contrast. Multiplanar CT image reconstructions of the cervical spine and maxillofacial structures were also generated. RADIATION DOSE REDUCTION: This exam was performed according to the departmental dose-optimization program which includes automated exposure control,  adjustment of the mA and/or kV according to patient size and/or use of iterative reconstruction technique. COMPARISON:  None Available. FINDINGS: CT HEAD FINDINGS Brain: No acute intracranial hemorrhage. No focal mass lesion. No CT evidence of acute infarction. No midline shift or mass effect. No hydrocephalus. Basilar cisterns are patent. There are periventricular and subcortical white matter hypodensities. Generalized cortical atrophy. Vascular: No hyperdense vessel or unexpected calcification. Skull: Normal. Negative for fracture or focal lesion. Sinuses/Orbits: Paranasal sinuses and mastoid air cells are clear. Orbits are clear. Other: Cutaneous  laceration superior to the RIGHT orbit and inferior to the RIGHT orbit. Globes appear normal. No intraconal abnormality. CT MAXILLOFACIAL FINDINGS Osseous: No fracture or mandibular dislocation. No destructive process. Orbits: Negative. No traumatic or inflammatory finding. Sinuses: Clear. Soft tissues: Skin laceration superior and inferior to the RIGHT orbit. No orbital injury or fracture. CT CERVICAL SPINE FINDINGS Alignment: Normal alignment of the cervical vertebral bodies. Skull base and vertebrae: Normal craniocervical junction. No loss of vertebral body height or disc height. Normal facet articulation. No evidence of fracture. Soft tissues and spinal canal: No prevertebral soft tissue swelling. No perispinal or epidural hematoma. Disc levels:  Disc space narrowing and osteophytosis at C4-C5. Upper chest: Clear Other: None IMPRESSION: 1. No intracranial trauma. 2. Skin laceration superior and inferior to the RIGHT orbit. No orbital injury or fracture. 3. No facial bone fracture. 4. No cervical spine fracture. 5. Disc osteophytic disease most severe at C4-C5. Electronically Signed   By: Suzy Bouchard M.D.   On: 10/16/2022 10:02   CT CERVICAL SPINE WO CONTRAST  Result Date: 10/16/2022 CLINICAL DATA:  Fall from bed.  Laceration.  Forehead laceration EXAM: CT  HEAD WITHOUT CONTRAST CT MAXILLOFACIAL WITHOUT CONTRAST CT CERVICAL SPINE WITHOUT CONTRAST TECHNIQUE: Multidetector CT imaging of the head, cervical spine, and maxillofacial structures were performed using the standard protocol without intravenous contrast. Multiplanar CT image reconstructions of the cervical spine and maxillofacial structures were also generated. RADIATION DOSE REDUCTION: This exam was performed according to the departmental dose-optimization program which includes automated exposure control, adjustment of the mA and/or kV according to patient size and/or use of iterative reconstruction technique. COMPARISON:  None Available. FINDINGS: CT HEAD FINDINGS Brain: No acute intracranial hemorrhage. No focal mass lesion. No CT evidence of acute infarction. No midline shift or mass effect. No hydrocephalus. Basilar cisterns are patent. There are periventricular and subcortical white matter hypodensities. Generalized cortical atrophy. Vascular: No hyperdense vessel or unexpected calcification. Skull: Normal. Negative for fracture or focal lesion. Sinuses/Orbits: Paranasal sinuses and mastoid air cells are clear. Orbits are clear. Other: Cutaneous laceration superior to the RIGHT orbit and inferior to the RIGHT orbit. Globes appear normal. No intraconal abnormality. CT MAXILLOFACIAL FINDINGS Osseous: No fracture or mandibular dislocation. No destructive process. Orbits: Negative. No traumatic or inflammatory finding. Sinuses: Clear. Soft tissues: Skin laceration superior and inferior to the RIGHT orbit. No orbital injury or fracture. CT CERVICAL SPINE FINDINGS Alignment: Normal alignment of the cervical vertebral bodies. Skull base and vertebrae: Normal craniocervical junction. No loss of vertebral body height or disc height. Normal facet articulation. No evidence of fracture. Soft tissues and spinal canal: No prevertebral soft tissue swelling. No perispinal or epidural hematoma. Disc levels:  Disc space  narrowing and osteophytosis at C4-C5. Upper chest: Clear Other: None IMPRESSION: 1. No intracranial trauma. 2. Skin laceration superior and inferior to the RIGHT orbit. No orbital injury or fracture. 3. No facial bone fracture. 4. No cervical spine fracture. 5. Disc osteophytic disease most severe at C4-C5. Electronically Signed   By: Suzy Bouchard M.D.   On: 10/16/2022 10:02   CT MAXILLOFACIAL WO CONTRAST  Result Date: 10/16/2022 CLINICAL DATA:  Fall from bed.  Laceration.  Forehead laceration EXAM: CT HEAD WITHOUT CONTRAST CT MAXILLOFACIAL WITHOUT CONTRAST CT CERVICAL SPINE WITHOUT CONTRAST TECHNIQUE: Multidetector CT imaging of the head, cervical spine, and maxillofacial structures were performed using the standard protocol without intravenous contrast. Multiplanar CT image reconstructions of the cervical spine and maxillofacial structures were also generated. RADIATION DOSE REDUCTION: This  exam was performed according to the departmental dose-optimization program which includes automated exposure control, adjustment of the mA and/or kV according to patient size and/or use of iterative reconstruction technique. COMPARISON:  None Available. FINDINGS: CT HEAD FINDINGS Brain: No acute intracranial hemorrhage. No focal mass lesion. No CT evidence of acute infarction. No midline shift or mass effect. No hydrocephalus. Basilar cisterns are patent. There are periventricular and subcortical white matter hypodensities. Generalized cortical atrophy. Vascular: No hyperdense vessel or unexpected calcification. Skull: Normal. Negative for fracture or focal lesion. Sinuses/Orbits: Paranasal sinuses and mastoid air cells are clear. Orbits are clear. Other: Cutaneous laceration superior to the RIGHT orbit and inferior to the RIGHT orbit. Globes appear normal. No intraconal abnormality. CT MAXILLOFACIAL FINDINGS Osseous: No fracture or mandibular dislocation. No destructive process. Orbits: Negative. No traumatic or  inflammatory finding. Sinuses: Clear. Soft tissues: Skin laceration superior and inferior to the RIGHT orbit. No orbital injury or fracture. CT CERVICAL SPINE FINDINGS Alignment: Normal alignment of the cervical vertebral bodies. Skull base and vertebrae: Normal craniocervical junction. No loss of vertebral body height or disc height. Normal facet articulation. No evidence of fracture. Soft tissues and spinal canal: No prevertebral soft tissue swelling. No perispinal or epidural hematoma. Disc levels:  Disc space narrowing and osteophytosis at C4-C5. Upper chest: Clear Other: None IMPRESSION: 1. No intracranial trauma. 2. Skin laceration superior and inferior to the RIGHT orbit. No orbital injury or fracture. 3. No facial bone fracture. 4. No cervical spine fracture. 5. Disc osteophytic disease most severe at C4-C5. Electronically Signed   By: Suzy Bouchard M.D.   On: 10/16/2022 10:02    Microbiology: Results for orders placed or performed during the hospital encounter of 04/28/21  Urine Culture     Status: Abnormal   Collection Time: 04/28/21 12:30 PM   Specimen: Urine, Random  Result Value Ref Range Status   Specimen Description   Final    URINE, RANDOM Performed at [facility], [address] [place], [place] [address]    Special Requests   Final    NONE Performed at [facility], [address] [place], [place] [address]    Culture >=100,000 COLONIES/mL ESCHERICHIA COLI (A)  Final   Report Status 05/01/2021 FINAL  Final   Organism ID, Bacteria ESCHERICHIA COLI (A)  Final      Susceptibility   Escherichia coli - MIC*    AMPICILLIN <=2 SENSITIVE Sensitive     CEFAZOLIN <=4 SENSITIVE Sensitive     CEFEPIME <=0.12 SENSITIVE Sensitive     CEFTRIAXONE <=0.25 SENSITIVE Sensitive     CIPROFLOXACIN <=0.25 SENSITIVE Sensitive     GENTAMICIN <=1 SENSITIVE Sensitive     IMIPENEM <=0.25 SENSITIVE Sensitive     NITROFURANTOIN <=16 SENSITIVE Sensitive     TRIMETH/SULFA  <=20 SENSITIVE Sensitive     AMPICILLIN/SULBACTAM <=2 SENSITIVE Sensitive     PIP/TAZO <=4 SENSITIVE Sensitive     * >=100,000 COLONIES/mL ESCHERICHIA COLI  Resp Panel by RT-PCR (Flu A&B, Covid) Nasopharyngeal Swab     Status: None   Collection Time: 04/28/21  6:36 PM   Specimen: Nasopharyngeal Swab; Nasopharyngeal(NP) swabs in vial transport medium  Result Value Ref Range Status   SARS Coronavirus 2 by RT PCR NEGATIVE NEGATIVE Final    Comment: (NOTE) SARS-CoV-2 target nucleic acids are NOT DETECTED.  The SARS-CoV-2 RNA is generally detectable in upper respiratory specimens during the acute phase of infection. The lowest concentration of SARS-CoV-2 viral copies this assay can detect is  138 copies/mL. A negative result does not preclude SARS-Cov-2 infection and should not be used as the sole basis for treatment or other patient management decisions. A negative result may occur with  improper specimen collection/handling, submission of specimen other than nasopharyngeal swab, presence of viral mutation(s) within the areas targeted by this assay, and inadequate number of viral copies(<138 copies/mL). A negative result must be combined with clinical observations, patient history, and epidemiological information. The expected result is Negative.  Fact Sheet for Patients:  EntrepreneurPulse.com.au  Fact Sheet for Healthcare Providers:  IncredibleEmployment.be  This test is no t yet approved or cleared by the [place] FDA and  has been authorized for detection and/or diagnosis of SARS-CoV-2 by FDA under an Emergency Use Authorization (EUA). This EUA will remain  in effect (meaning this test can be used) for the duration of the COVID-19 declaration under Section 564(b)(1) of the Act, 21 U.S.C.section 360bbb-3(b)(1), unless the authorization is terminated  or revoked sooner.       Influenza A by PCR NEGATIVE NEGATIVE Final   Influenza B by PCR  NEGATIVE NEGATIVE Final    Comment: (NOTE) The Xpert Xpress SARS-CoV-2/FLU/RSV plus assay is intended as an aid in the diagnosis of influenza from Nasopharyngeal swab specimens and should not be used as a sole basis for treatment. Nasal washings and aspirates are unacceptable for Xpert Xpress SARS-CoV-2/FLU/RSV testing.  Fact Sheet for Patients: EntrepreneurPulse.com.au  Fact Sheet for Healthcare Providers: IncredibleEmployment.be  This test is not yet approved or cleared by the [place] FDA and has been authorized for detection and/or diagnosis of SARS-CoV-2 by FDA under an Emergency Use Authorization (EUA). This EUA will remain in effect (meaning this test can be used) for the duration of the COVID-19 declaration under Section 564(b)(1) of the Act, 21 U.S.C. section 360bbb-3(b)(1), unless the authorization is terminated or revoked.  Performed at [facility], [address] [place], [place] [address]   MRSA Next Gen by PCR, Nasal     Status: None   Collection Time: 04/29/21  5:39 AM   Specimen: Nasal Mucosa; Nasal Swab  Result Value Ref Range Status   MRSA by PCR Next Gen NOT DETECTED NOT DETECTED Final    Comment: (NOTE) The GeneXpert MRSA Assay (FDA approved for NASAL specimens only), is one component of a comprehensive MRSA colonization surveillance program. It is not intended to diagnose MRSA infection nor to guide or monitor treatment for MRSA infections. Test performance is not FDA approved in patients less than 33 years old. Performed at [facility], [place], [place] [address]   Resp Panel by RT-PCR (Flu A&B, Covid) Nasopharyngeal Swab     Status: None   Collection Time: 05/01/21 10:00 AM   Specimen: Nasopharyngeal Swab; Nasopharyngeal(NP) swabs in vial transport medium  Result Value Ref Range Status   SARS Coronavirus 2 by RT PCR NEGATIVE NEGATIVE Final    Comment: (NOTE) SARS-CoV-2  target nucleic acids are NOT DETECTED.  The SARS-CoV-2 RNA is generally detectable in upper respiratory specimens during the acute phase of infection. The lowest concentration of SARS-CoV-2 viral copies this assay can detect is 138 copies/mL. A negative result does not preclude SARS-Cov-2 infection and should not be used as the sole basis for treatment or other patient management decisions. A negative result may occur with  improper specimen collection/handling, submission of specimen other than nasopharyngeal swab, presence of viral mutation(s) within the areas targeted by this assay, and inadequate number of viral copies(<138 copies/mL).  A negative result must be combined with clinical observations, patient history, and epidemiological information. The expected result is Negative.  Fact Sheet for Patients:  EntrepreneurPulse.com.au  Fact Sheet for Healthcare Providers:  IncredibleEmployment.be  This test is no t yet approved or cleared by the [place] FDA and  has been authorized for detection and/or diagnosis of SARS-CoV-2 by FDA under an Emergency Use Authorization (EUA). This EUA will remain  in effect (meaning this test can be used) for the duration of the COVID-19 declaration under Section 564(b)(1) of the Act, 21 U.S.C.section 360bbb-3(b)(1), unless the authorization is terminated  or revoked sooner.       Influenza A by PCR NEGATIVE NEGATIVE Final   Influenza B by PCR NEGATIVE NEGATIVE Final    Comment: (NOTE) The Xpert Xpress SARS-CoV-2/FLU/RSV plus assay is intended as an aid in the diagnosis of influenza from Nasopharyngeal swab specimens and should not be used as a sole basis for treatment. Nasal washings and aspirates are unacceptable for Xpert Xpress SARS-CoV-2/FLU/RSV testing.  Fact Sheet for Patients: EntrepreneurPulse.com.au  Fact Sheet for Healthcare  Providers: IncredibleEmployment.be  This test is not yet approved or cleared by the [place] FDA and has been authorized for detection and/or diagnosis of SARS-CoV-2 by FDA under an Emergency Use Authorization (EUA). This EUA will remain in effect (meaning this test can be used) for the duration of the COVID-19 declaration under Section 564(b)(1) of the Act, 21 U.S.C. section 360bbb-3(b)(1), unless the authorization is terminated or revoked.  Performed at [facility], [address] [place], [place] [address]     Labs: CBC: Recent Labs  Lab 10/16/22 0859 10/17/22 0521  WBC 8.9 9.0  HGB 11.5* 8.8*  HCT 36.9 27.4*  MCV 88.5 86.2  PLT 223 941   Basic Metabolic Panel: Recent Labs  Lab 10/16/22 0859 10/17/22 0402  NA 134* 134*  K 4.7 4.3  CL 98 100  CO2 28 26  GLUCOSE 113* 107*  BUN 20 20  CREATININE 0.75 0.67  CALCIUM 9.8 9.0   Liver Function Tests: No results for input(s): "AST", "ALT", "ALKPHOS", "BILITOT", "PROT", "ALBUMIN" in the last 168 hours. CBG: No results for input(s): "GLUCAP" in the last 168 hours.  Discharge time spent: greater than 30 minutes.  This record has been created using Systems analyst. Errors have been sought and corrected,but may not always be located. Such creation errors do not reflect on the standard of care.   Signed: Lorella Nimrod, MD [facility] 10/17/2022

## 2022-10-17 NOTE — TOC Transition Note (Signed)
Transition of Care [facility]) - CM/SW Discharge Note   Patient Details  Name: Kari Mcdonald MRN: [id number] Date of Birth: June 24, 1932  [facility]) CM/SW Contact:  Quin Hoop, LCSW Phone Number: 10/17/2022, 2:53 PM   Clinical Narrative:   Patient to be transported back to [facility] where she is a LTC resident.  Pt's son, Chrissie Noa, notified by phone of discharge.    Final next level of care: Home w Home Health Services Barriers to Discharge: No Barriers Identified   Patient Goals and CMS Choice      Discharge Placement                  Patient to be transferred to facility by: [facility] Name of family member notified: Prudencio Burly, notified by phone Patient and family notified of of transfer: 10/17/22  Discharge Plan and Services Additional resources added to the After Visit Summary for     Discharge Planning Services: CM Consult Post Acute Care Choice: Resumption of Svcs/PTA Provider          DME Arranged: N/A DME Agency:  (Patient returning to LTC facility and OT/PT/RN to be provided by [facility])                  Social Determinants of Health (SDOH) Interventions SDOH Screenings   Transportation Needs: No Transportation Needs (10/17/2022)  Tobacco Use: High Risk (10/16/2022)     Readmission Risk Interventions    04/30/2021    1:31 PM  Readmission Risk Prevention Plan  Transportation Screening Complete  HRI or Home Care Consult Complete  Social Work Consult for [place] Planning/Counseling Not Complete  SW consult not completed comments RNCM assigned to patient  Palliative Care Screening Not Applicable  Medication Review Press photographer) Complete

## 2022-10-17 NOTE — Progress Notes (Addendum)
Initial Nutrition Assessment  DOCUMENTATION CODES:   Severe malnutrition in context of chronic illness  INTERVENTION:   -Ensure Enlive po TID, each supplement provides 350 kcal and 20 grams of protein.  -Liberalize diet to regular for widest variety of meal selections  NUTRITION DIAGNOSIS:   Severe Malnutrition related to chronic illness (COPD) as evidenced by moderate fat depletion, severe fat depletion, moderate muscle depletion, severe muscle depletion.  GOAL:   Patient will meet greater than or equal to 90% of their needs  MONITOR:   PO intake, Supplement acceptance  REASON FOR ASSESSMENT:   Consult Assessment of nutrition requirement/status, Hip fracture protocol  ASSESSMENT:   Pt with history of depression, hypertension, anxiety, GERD, history of COPD, CAD, PAD, anemia, current tobacco user, who presents for chief concern of falling out of bed in the AM.  Pt admitted with lt femur fracture s/p fall.   Reviewed I/O's: -300 ml x 24 hours  UOP: 300 ml x 24 hours  Per orthopedics notes, plan for non-surgical intervention.   Spoke with pt and daughter at bedside. Pt is hard of hearing, but can communicate if you speak clearly to her. Daughter shares that pt resides at [facility]. Pt with variable appetite PTA; many times it depends on what is served at [facility]. Per daughter, pt will always drink Ensure, especially is pt did not each much of the meal that was served. Pt ate a good breakfast this morning- 100% of oatmeal and boiled egg.   Pt denies any weight loss. No wt loss noted over the past year, however, pt with distant history of weight loss.   Discussed importance of good meal and supplement intake to promote healing. Pt amenable to diet liberalization and supplements.    Medications reviewed and include vitamin D3, celexa, depakote, melatonin, and remeron.   Labs reviewed: Na: 134.    NUTRITION - FOCUSED PHYSICAL EXAM:  Flowsheet Row Most Recent Value   Orbital Region Severe depletion  Upper Arm Region Severe depletion  Thoracic and Lumbar Region Moderate depletion  Buccal Region Moderate depletion  Temple Region Severe depletion  Clavicle Bone Region Severe depletion  Clavicle and Acromion Bone Region Severe depletion  Scapular Bone Region Severe depletion  Dorsal Hand Moderate depletion  Patellar Region Moderate depletion  Anterior Thigh Region Moderate depletion  Posterior Calf Region Moderate depletion  Edema (RD Assessment) None  Hair Reviewed  Eyes Reviewed  Mouth Reviewed  Skin Reviewed  Nails Reviewed       Diet Order:   Diet Order             Diet regular Fluid consistency: Thin  Diet effective now           Diet - low sodium heart healthy                   EDUCATION NEEDS:   Education needs have been addressed  Skin:  Skin Assessment: Reviewed RN Assessment  Last BM:  Unknown  Height:   Ht Readings from Last 1 Encounters:  10/16/22 '5\' 3"'$  (1.6 m)    Weight:   Wt Readings from Last 1 Encounters:  10/16/22 54.9 kg    Ideal Body Weight:  52.3 kg  BMI:  Body mass index is 21.43 kg/m.  Estimated Nutritional Needs:   Kcal:  1650-1850  Protein:  85-100 grams  Fluid:  > 1.6 L    Loistine Chance, RD, LDN, [place] Registered Dietitian II Certified Diabetes Care and Education Specialist Please  refer to [facility] for RD and/or RD on-call/weekend/after hours pager

## 2022-10-17 NOTE — Evaluation (Signed)
Occupational Therapy Evaluation Patient Details Name: Kari Mcdonald MRN: [id number] DOB: 09-13-1932 Today's Date: 10/17/2022   History of Present Illness Pt. is an 89 y.o female who was admitted to [facility] after sustaining a Left distal femur fracture during a fall when attempting to get out of bed. Pt. is LNWB. PMHx includes: Depression, HTN, Anxiety, GERD, COPD, CAD, PAD, Anemia   Clinical Impression   Pt. Presents with 9/10 left knee pain at rest, weakness, limited activity tolerance, and limited functional mobility which limits the ability to complete basic ADL and IADL functioning.  Pt. Resides at [facility] facility.  Pt. Was independent with transfers to the w/c, bed, and toilet. Pt. Was independent with feeding, grooming, and dressing. Pt. Required assist from staff for bathing. Pt. Spent most of the day in the w/c, does not ambulate per daughter. Pt. Was on O2 at night. Pt. Is currently Total Assist for LE ADLs, and all mobility.  Pt.'s SpO2 is 98%, and HR 81 bpms on 2LO2.  Pt. Has orders in place for a knee immobilizer, however the knee immobilizer is unable to achieve the proper fit, and alignment due to left knee flexion stiffness, and swelling. Pt./caregiver education was provided about positioning, and was repositioned with Total A x's 2.  Pt. Could benefit from OT services for ADL training, A/E training, there. Ex,  and pt. Education about positioning.        Recommendations for follow up therapy are one component of a multi-disciplinary discharge planning process, led by the attending physician.  Recommendations may be updated based on patient status, additional functional criteria and insurance authorization.   Follow Up Recommendations  Skilled nursing-short term rehab (<3 hours/day)     Assistance Recommended at Discharge    Patient can return home with the following Assist for transportation;Direct supervision/assist for medications management;Direct supervision/assist for  financial management;Two people to help with bathing/dressing/bathroom;Other (comment) (Pt. unsafe for tranfers at the time)    Functional Status Assessment  Patient has had a recent decline in their functional status and demonstrates the ability to make significant improvements in function in a reasonable and predictable amount of time.  Equipment Recommendations       Recommendations for Other Services       Precautions / Restrictions Precautions Precautions: Knee Required Braces or Orthoses: Knee Immobilizer - Left (Pt. unable to where Left knee immobilizer 2/2 knee flexion, and swelling) Restrictions Weight Bearing Restrictions: Yes LLE Weight Bearing: Non weight bearing      Mobility Bed Mobility               General bed mobility comments: total assist x's 2 to slide up to towards the [facility]    Transfers                   General transfer comment: Pt. unable/unsafe to perform      Balance                                           ADL either performed or assessed with clinical judgement   ADL Overall ADL's : Needs assistance/impaired Eating/Feeding: Independent;Set up;Bed level   Grooming: Minimal assistance;Set up;Bed level           Upper Body Dressing : Maximal assistance   Lower Body Dressing: Total assistance   Toilet Transfer: Total assistance  Vision Baseline Vision/History: 1 Wears glasses Patient Visual Report: No change from baseline       Perception     Praxis      Pertinent Vitals/Pain Pain Assessment Pain Assessment: 0-10 Pain Score: 9  Pain Location: left knee Pain Descriptors / Indicators: Discomfort Pain Intervention(s): Monitored during session, Patient requesting pain meds-RN notified, Repositioned     Hand Dominance Right   Extremity/Trunk Assessment Upper Extremity Assessment Upper Extremity Assessment: Generalized weakness           Communication  Communication Communication: HOH   Cognition Arousal/Alertness: Awake/alert Behavior During Therapy: Flat affect Overall Cognitive Status: Within Functional Limits for tasks assessed                                       General Comments       Exercises     Shoulder Instructions      Home Living Family/patient expects to be discharged to:: Skilled nursing facility                                        Prior Functioning/Environment Prior Level of Function : Needs assist             Mobility Comments: Used a w/c fpr  mobility in the facility ADLs Comments: Independent t/fs to the w/c, t/fs for toileting.and dressing, assist with showers.        OT Problem List: Decreased strength;Decreased range of motion;Decreased activity tolerance;Decreased knowledge of precautions;Decreased safety awareness;Pain;Decreased knowledge of use of DME or AE;Impaired balance (sitting and/or standing)      OT Treatment/Interventions: Self-care/ADL training;Therapeutic exercise;DME and/or AE instruction;Energy conservation;Patient/family education    OT Goals(Current goals can be found in the care plan section) Acute Rehab OT Goals OT Goal Formulation: Patient unable to participate in goal setting Time For Goal Achievement: 11/15/22 Potential to Achieve Goals: Fair  OT Frequency: Min 2X/week    Co-evaluation              AM-PAC OT "6 Clicks" Daily Activity     Outcome Measure Help from another person eating meals?: None Help from another person taking care of personal grooming?: A Little Help from another person toileting, which includes using toliet, bedpan, or urinal?: Total Help from another person bathing (including washing, rinsing, drying)?: Total Help from another person to put on and taking off regular upper body clothing?: A Lot Help from another person to put on and taking off regular lower body clothing?: Total 6 Click Score: 12   End  of Session    Activity Tolerance: Patient limited by pain Patient left: in bed;with family/visitor present  OT Visit Diagnosis: Muscle weakness (generalized) (M62.81);History of falling (Z91.81)                Time: 8889-1694 OT Time Calculation (min): 36 min Charges:  OT General Charges $OT Visit: 1 Visit OT Evaluation $OT Eval Moderate Complexity: 1 Mod  Harrel Carina, MS, OTR/L   Harrel Carina 10/17/2022, 11:35 AM

## 2022-10-17 NOTE — TOC Progression Note (Signed)
Transition of Care [facility]) - Progression Note    Patient Details  Name: Kari Mcdonald MRN: [id number] Date of Birth: [birth year]  Transition of [facility]) CM/SW [place], LCSW Phone Number: 10/17/2022, 12:10 PM  Clinical Narrative:    Pt is being recommended for rehab.  CSW reached out to [facility] (where pt is LTC) to see if they provide rehab for pt at facility.  Lvm.  Waiting to hear back from facility.   Expected Discharge Plan: [place] Barriers to Discharge: Continued Medical Work up  Expected Discharge Plan and Services   Discharge Planning Services: CM Consult Post Acute Care Choice: Resumption of Svcs/PTA Provider Living arrangements for the past 2 months: [place]                 DME Arranged: N/A                     Social Determinants of Health (SDOH) Interventions SDOH Screenings   Transportation Needs: No Transportation Needs (10/17/2022)  Tobacco Use: High Risk (10/16/2022)    Readmission Risk Interventions    04/30/2021    1:31 PM  Readmission Risk Prevention Plan  Transportation Screening Complete  HRI or Home Care Consult Complete  Social Work Consult for [place] Planning/Counseling Not Complete  SW consult not completed comments RNCM assigned to patient  Palliative Care Screening Not Applicable  Medication Review Press photographer) Complete

## 2022-10-17 NOTE — Care Management CC44 (Signed)
Condition Code 44 Documentation Completed  Patient Details  Name: CHLOEE TENA MRN: [id number] Date of Birth: Jun 24, 1932   Condition Code 44 given:  Yes Patient signature on Condition Code 44 notice:  Yes Documentation of 2 MD's agreement:  Yes Code 44 added to claim:  Yes    Judeen Hammans Elysabeth Aust, LCSW 10/17/2022, 3:44 PM

## 2022-10-17 NOTE — Consult Note (Signed)
ORTHOPAEDIC CONSULTATION  REQUESTING PHYSICIAN: Lorella Nimrod, MD  Chief Complaint:   Left distal femur periprosthetic fracture  History of Present Illness: Kari Mcdonald is a 89 y.o. female with history of depression, hypertension, anxiety, GERD, history of COPD, CAD, PAD, anemia, current tobacco user, who presents to the ED for chief concern of falling out of bed in the AM yesterday.  Patient's daughter is at bedside and assisting with history.  The patient is alert and oriented to person and age and place.  She denies any loss of consciousness from the fall and reports she fell when trying to get out of bed.  She reports she has had other similar falls in the past and normally does not ambulate except for transfers with a walker.  She reports she had immediate pain in her left leg and was unable to stand.  She denies any chest pain, abdominal pain, dysuria, hematuria, diet, vomiting, or shortness of breath.   Past Medical History:  Diagnosis Date   Anemia    Arthritis    left knee   Cancer ([place])    Skin Cancer   COPD (chronic obstructive pulmonary disease) ([place])    Coronary artery disease    Depression    Diverticulosis    Dysphagia    Dyspnea    GERD (gastroesophageal reflux disease)    GI bleed    Headache    from eye strain   History of hiatal hernia    HOH (hard of hearing)    Hyperlipidemia    Hypertension    Myocardial infarction [facility])    2006   Osteoporosis    Wears dentures    full upper and lower   Past Surgical History:  Procedure Laterality Date   ABDOMINAL HYSTERECTOMY     APPENDECTOMY     CATARACT EXTRACTION W/PHACO Left 08/06/2016   Procedure: CATARACT EXTRACTION PHACO AND INTRAOCULAR LENS PLACEMENT ([place]);  Surgeon: Leandrew Koyanagi, MD;  Location: [place];  Service: Ophthalmology;  Laterality: Left;   CATARACT EXTRACTION W/PHACO Right 09/10/2016   Procedure: CATARACT  EXTRACTION PHACO AND INTRAOCULAR LENS PLACEMENT ([facility]);  Surgeon: Leandrew Koyanagi, MD;  Location: [place];  Service: Ophthalmology;  Laterality: Right;   CHOLECYSTECTOMY     COLONOSCOPY     COLONOSCOPY WITH PROPOFOL N/A 12/22/2016   Procedure: COLONOSCOPY WITH PROPOFOL;  Surgeon: Lucilla Lame, MD;  Location: [place];  Service: Endoscopy;  Laterality: N/A;   ESOPHAGOGASTRODUODENOSCOPY     ESOPHAGOGASTRODUODENOSCOPY (EGD) WITH PROPOFOL N/A 07/21/2016   Procedure: ESOPHAGOGASTRODUODENOSCOPY (EGD) WITH PROPOFOL;  Surgeon: Lollie Sails, MD;  Location[facility];  Service: Endoscopy;  Laterality: N/A;   GANGLION CYST EXCISION Left    wrist (x2)   INTRAMEDULLARY (IM) NAIL INTERTROCHANTERIC Left 06/01/2017   Procedure: INTRAMEDULLARY (IM) NAIL INTERTROCHANTRIC;  Surgeon: Hessie Knows, MD;  Location: [facility];  Service: Orthopedics;  Laterality: Left;   PILONIDAL CYST EXCISION     Social History   Socioeconomic History   Marital status: Widowed    Spouse name: Not on file   Number of children: Not on file   Years of education: Not on file   Highest education level: Not on file  Occupational History   Not on file  Tobacco Use   Smoking status: Every Day    Packs/day: 1.00    Years: 70.00    Total pack years: 70.00    Types: Cigarettes   Smokeless tobacco: Never  Substance and Sexual Activity   Alcohol use:  No   Drug use: No   Sexual activity: Not on file  Other Topics Concern   Not on file  Social History Narrative   Not on file   Social Determinants of Health   Financial Resource Strain: Not on file  Food Insecurity: Not on file  Transportation Needs: No Transportation Needs (10/17/2022)   PRAPARE - Hydrologist (Medical): No    Lack of Transportation (Non-Medical): No  Physical Activity: Not on file  Stress: Not on file  Social Connections: Not on file   Family History  Problem Relation Age of Onset    Alzheimer's disease Mother    Lymphoma Father    Allergies  Allergen Reactions   Penicillins Rash    Has patient had a PCN reaction causing immediate rash, facial/tongue/throat swelling, SOB or lightheadedness with hypotension: Unknown Has patient had a PCN reaction causing severe rash involving mucus membranes or skin necrosis: Unknown Has patient had a PCN reaction that required hospitalization: Unknown Has patient had a PCN reaction occurring within the last 10 years: Unknown If all of the above answers are "NO", then may proceed with Cephalosporin use.    Prior to Admission medications   Medication Sig Start Date End Date Taking? Authorizing Provider  amLODipine (NORVASC) 10 MG tablet Take 10 mg by mouth daily.   Yes [provider]  Baclofen 5 MG TABS Take 5 mg by mouth 2 (two) times daily.   Yes [provider]  carboxymethylcellulose (REFRESH PLUS) 0.5 % SOLN Place 1 drop into both eyes 2 (two) times daily.   Yes [provider]  cholecalciferol (VITAMIN D3) 25 MCG (1000 UNIT) tablet Take 1,000 Units by mouth daily.   Yes [provider]  citalopram (CELEXA) 20 MG tablet Take 20 mg by mouth daily.   Yes [provider]  divalproex (DEPAKOTE) 125 MG DR tablet Take 125 mg by mouth 2 (two) times daily. (0900 and 1400)   Yes [provider]  divalproex (DEPAKOTE) 125 MG DR tablet Take 250 mg by mouth at bedtime.   Yes [provider]  EARWAX REMOVAL 6.5 % OTIC solution SMARTSIG:In Ear(s) 09/19/22  Yes [provider]  enalapril (VASOTEC) 10 MG tablet Take 10 mg by mouth daily. Hold for SBP < 110 and pulse < 60, call provider   Yes [provider]  Fluticasone-Salmeterol (ADVAIR) 250-50 MCG/DOSE AEPB Inhale 1 puff into the lungs every 12 (twelve) hours.   Yes [provider]  latanoprost (XALATAN) 0.005 % ophthalmic solution Place 1 drop into both eyes at bedtime. 04/15/22  Yes [provider]   Liniments (DEEP BLUE RELIEF) GEL Apply topically. 06/26/22  Yes [provider]  mirtazapine (REMERON) 7.5 MG tablet Take 7.5 mg by mouth at bedtime.   Yes [provider]  omeprazole (PRILOSEC) 40 MG capsule Take 40 mg by mouth daily before breakfast.   Yes [provider]  SYSTANE PRESERVATIVE FREE 0.4-0.3 % SOLN Apply 1 drop to eye 2 (two) times daily. 03/11/22  Yes [provider]  tiotropium (SPIRIVA) 18 MCG inhalation capsule Place 18 mcg into inhaler and inhale daily.   Yes [provider]  acetaminophen (TYLENOL) 650 MG CR tablet Take 1,300 mg by mouth 2 (two) times daily.    [provider]  albuterol (PROVENTIL HFA;VENTOLIN HFA) 108 (90 Base) MCG/ACT inhaler Inhale 2 puffs into the lungs every 6 (six) hours as needed for wheezing or shortness of breath.  [provider]  cloNIDine (CATAPRES) 0.1 MG tablet Take 0.1 mg by mouth daily.    [provider]  diclofenac Sodium (VOLTAREN) 1 % GEL Apply 4 g topically 3 (three) times daily. (Apply to bilateral knees)    [provider]  HYDROcodone-acetaminophen (NORCO) 10-325 MG tablet Take 1 tablet by mouth every 6 (six) hours as needed for moderate pain.    [provider]  lidocaine (LIDODERM) 5 % Place 1 patch onto the skin daily.    [provider]  Melatonin 10 MG TABS Take 10 mg by mouth at bedtime.    [provider]  Olopatadine HCl (PATADAY) 0.7 % SOLN Apply 1 drop to eye daily at 12 noon.    [provider]  polyethylene glycol (MIRALAX / GLYCOLAX) 17 g packet Take 17 g by mouth daily.    [provider]  sennosides-docusate sodium (SENOKOT-S) 8.6-50 MG tablet Take 1 tablet by mouth 2 (two) times daily.    [provider]  traZODone (DESYREL) 150 MG tablet Take 150 mg by mouth at bedtime.    [provider]   DG Chest 1 View  Result Date: 10/16/2022 CLINICAL DATA:  Fall.  Left leg pain. EXAM:  CHEST  1 VIEW COMPARISON:  Chest two views 04/28/2021, AP chest 07/21/2018 FINDINGS: Cardiac silhouette is again moderately enlarged. Moderate calcification is again seen within the aortic arch. Resolution of the previously seen left mid and lower lung heterogeneous airspace opacities on 04/28/2021. The lungs now appear clear, more similar to 07/21/2018. Mild dextrocurvature of the midthoracic spine with moderate multilevel degenerative disc changes. IMPRESSION: Resolution of the previously seen left mid and lower lung heterogeneous airspace opacities. No acute cardiopulmonary process. Electronically Signed   By: Yvonne Kendall M.D.   On: 10/16/2022 14:52   DG Knee Complete 4 Views Left  Result Date: 10/16/2022 CLINICAL DATA:  Left lower leg pain after fall today. EXAM: LEFT FEMUR 2 VIEWS; LEFT KNEE - COMPLETE 4+ VIEW COMPARISON:  Left knee radiographs 04/28/2021, pelvis and left hip radiographs 06/01/2017 FINDINGS: There is diffuse decreased bone mineralization. Long cephalomedullary nail fixation of a now remote intertrochanteric fracture, seen acutely on 06/01/2017 prior radiographs. No perihardware lucency is seen to indicate hardware failure or loosening. There is new approximate 3 mm cortical step-off at the distal anterior femur in the metadiaphyseal region, at the level of the distal aspect of the femoral stem. Associated longitudinal subcortical linear lucency. This is suspicious for a nondisplaced acute periprosthetic fracture. On oblique view of the knee there is also a longitudinal vertically-oriented lucency extending distally from the tip of the distal femoral metadiaphyseal hardware interlocking screw, at the region of the distal femoral metadiaphyseal medial cortex, indicating an acute fracture that may represent the same for anterior fracture line seen on lateral view. Moderate patellofemoral joint space narrowing and peripheral osteophytosis. Mild-to-moderate knee joint effusion. Severe lateral  compartment osteoarthritis. Moderate vascular calcifications. IMPRESSION: Acute nondisplaced periprosthetic fracture of the distal anterior femur, at the level of the distal aspect of the femoral stem. A longitudinal fracture line lucency is seen at the far anterior femoral cortex at the level of the distal femoral prosthetic stem, and extending distally from the medial tip of the distal interlocking screw (at the medial cortex of the distal femoral metadiaphysis) on frontal view. These lucencies are favored to represent the same fracture. Electronically Signed   By: Yvonne Kendall M.D.   On: 10/16/2022 14:50   DG FEMUR MIN 2 VIEWS  LEFT  Result Date: 10/16/2022 CLINICAL DATA:  Left lower leg pain after fall today. EXAM: LEFT FEMUR 2 VIEWS; LEFT KNEE - COMPLETE 4+ VIEW COMPARISON:  Left knee radiographs 04/28/2021, pelvis and left hip radiographs 06/01/2017 FINDINGS: There is diffuse decreased bone mineralization. Long cephalomedullary nail fixation of a now remote intertrochanteric fracture, seen acutely on 06/01/2017 prior radiographs. No perihardware lucency is seen to indicate hardware failure or loosening. There is new approximate 3 mm cortical step-off at the distal anterior femur in the metadiaphyseal region, at the level of the distal aspect of the femoral stem. Associated longitudinal subcortical linear lucency. This is suspicious for a nondisplaced acute periprosthetic fracture. On oblique view of the knee there is also a longitudinal vertically-oriented lucency extending distally from the tip of the distal femoral metadiaphyseal hardware interlocking screw, at the region of the distal femoral metadiaphyseal medial cortex, indicating an acute fracture that may represent the same for anterior fracture line seen on lateral view. Moderate patellofemoral joint space narrowing and peripheral osteophytosis. Mild-to-moderate knee joint effusion. Severe lateral compartment osteoarthritis. Moderate vascular  calcifications. IMPRESSION: Acute nondisplaced periprosthetic fracture of the distal anterior femur, at the level of the distal aspect of the femoral stem. A longitudinal fracture line lucency is seen at the far anterior femoral cortex at the level of the distal femoral prosthetic stem, and extending distally from the medial tip of the distal interlocking screw (at the medial cortex of the distal femoral metadiaphysis) on frontal view. These lucencies are favored to represent the same fracture. Electronically Signed   By: Yvonne Kendall M.D.   On: 10/16/2022 14:50   CT HEAD WO CONTRAST  Result Date: 10/16/2022 CLINICAL DATA:  Fall from bed.  Laceration.  Forehead laceration EXAM: CT HEAD WITHOUT CONTRAST CT MAXILLOFACIAL WITHOUT CONTRAST CT CERVICAL SPINE WITHOUT CONTRAST TECHNIQUE: Multidetector CT imaging of the head, cervical spine, and maxillofacial structures were performed using the standard protocol without intravenous contrast. Multiplanar CT image reconstructions of the cervical spine and maxillofacial structures were also generated. RADIATION DOSE REDUCTION: This exam was performed according to the departmental dose-optimization program which includes automated exposure control, adjustment of the mA and/or kV according to patient size and/or use of iterative reconstruction technique. COMPARISON:  None Available. FINDINGS: CT HEAD FINDINGS Brain: No acute intracranial hemorrhage. No focal mass lesion. No CT evidence of acute infarction. No midline shift or mass effect. No hydrocephalus. Basilar cisterns are patent. There are periventricular and subcortical white matter hypodensities. Generalized cortical atrophy. Vascular: No hyperdense vessel or unexpected calcification. Skull: Normal. Negative for fracture or focal lesion. Sinuses/Orbits: Paranasal sinuses and mastoid air cells are clear. Orbits are clear. Other: Cutaneous laceration superior to the RIGHT orbit and inferior to the RIGHT orbit. Globes  appear normal. No intraconal abnormality. CT MAXILLOFACIAL FINDINGS Osseous: No fracture or mandibular dislocation. No destructive process. Orbits: Negative. No traumatic or inflammatory finding. Sinuses: Clear. Soft tissues: Skin laceration superior and inferior to the RIGHT orbit. No orbital injury or fracture. CT CERVICAL SPINE FINDINGS Alignment: Normal alignment of the cervical vertebral bodies. Skull base and vertebrae: Normal craniocervical junction. No loss of vertebral body height or disc height. Normal facet articulation. No evidence of fracture. Soft tissues and spinal canal: No prevertebral soft tissue swelling. No perispinal or epidural hematoma. Disc levels:  Disc space narrowing and osteophytosis at C4-C5. Upper chest: Clear Other: None IMPRESSION: 1. No intracranial trauma. 2. Skin laceration superior and inferior to the RIGHT orbit. No orbital injury or fracture. 3. No facial  bone fracture. 4. No cervical spine fracture. 5. Disc osteophytic disease most severe at C4-C5. Electronically Signed   By: Suzy Bouchard M.D.   On: 10/16/2022 10:02   CT CERVICAL SPINE WO CONTRAST  Result Date: 10/16/2022 CLINICAL DATA:  Fall from bed.  Laceration.  Forehead laceration EXAM: CT HEAD WITHOUT CONTRAST CT MAXILLOFACIAL WITHOUT CONTRAST CT CERVICAL SPINE WITHOUT CONTRAST TECHNIQUE: Multidetector CT imaging of the head, cervical spine, and maxillofacial structures were performed using the standard protocol without intravenous contrast. Multiplanar CT image reconstructions of the cervical spine and maxillofacial structures were also generated. RADIATION DOSE REDUCTION: This exam was performed according to the departmental dose-optimization program which includes automated exposure control, adjustment of the mA and/or kV according to patient size and/or use of iterative reconstruction technique. COMPARISON:  None Available. FINDINGS: CT HEAD FINDINGS Brain: No acute intracranial hemorrhage. No focal mass  lesion. No CT evidence of acute infarction. No midline shift or mass effect. No hydrocephalus. Basilar cisterns are patent. There are periventricular and subcortical white matter hypodensities. Generalized cortical atrophy. Vascular: No hyperdense vessel or unexpected calcification. Skull: Normal. Negative for fracture or focal lesion. Sinuses/Orbits: Paranasal sinuses and mastoid air cells are clear. Orbits are clear. Other: Cutaneous laceration superior to the RIGHT orbit and inferior to the RIGHT orbit. Globes appear normal. No intraconal abnormality. CT MAXILLOFACIAL FINDINGS Osseous: No fracture or mandibular dislocation. No destructive process. Orbits: Negative. No traumatic or inflammatory finding. Sinuses: Clear. Soft tissues: Skin laceration superior and inferior to the RIGHT orbit. No orbital injury or fracture. CT CERVICAL SPINE FINDINGS Alignment: Normal alignment of the cervical vertebral bodies. Skull base and vertebrae: Normal craniocervical junction. No loss of vertebral body height or disc height. Normal facet articulation. No evidence of fracture. Soft tissues and spinal canal: No prevertebral soft tissue swelling. No perispinal or epidural hematoma. Disc levels:  Disc space narrowing and osteophytosis at C4-C5. Upper chest: Clear Other: None IMPRESSION: 1. No intracranial trauma. 2. Skin laceration superior and inferior to the RIGHT orbit. No orbital injury or fracture. 3. No facial bone fracture. 4. No cervical spine fracture. 5. Disc osteophytic disease most severe at C4-C5. Electronically Signed   By: Suzy Bouchard M.D.   On: 10/16/2022 10:02   CT MAXILLOFACIAL WO CONTRAST  Result Date: 10/16/2022 CLINICAL DATA:  Fall from bed.  Laceration.  Forehead laceration EXAM: CT HEAD WITHOUT CONTRAST CT MAXILLOFACIAL WITHOUT CONTRAST CT CERVICAL SPINE WITHOUT CONTRAST TECHNIQUE: Multidetector CT imaging of the head, cervical spine, and maxillofacial structures were performed using the standard  protocol without intravenous contrast. Multiplanar CT image reconstructions of the cervical spine and maxillofacial structures were also generated. RADIATION DOSE REDUCTION: This exam was performed according to the departmental dose-optimization program which includes automated exposure control, adjustment of the mA and/or kV according to patient size and/or use of iterative reconstruction technique. COMPARISON:  None Available. FINDINGS: CT HEAD FINDINGS Brain: No acute intracranial hemorrhage. No focal mass lesion. No CT evidence of acute infarction. No midline shift or mass effect. No hydrocephalus. Basilar cisterns are patent. There are periventricular and subcortical white matter hypodensities. Generalized cortical atrophy. Vascular: No hyperdense vessel or unexpected calcification. Skull: Normal. Negative for fracture or focal lesion. Sinuses/Orbits: Paranasal sinuses and mastoid air cells are clear. Orbits are clear. Other: Cutaneous laceration superior to the RIGHT orbit and inferior to the RIGHT orbit. Globes appear normal. No intraconal abnormality. CT MAXILLOFACIAL FINDINGS Osseous: No fracture or mandibular dislocation. No destructive process. Orbits: Negative. No traumatic or inflammatory finding. Sinuses:  Clear. Soft tissues: Skin laceration superior and inferior to the RIGHT orbit. No orbital injury or fracture. CT CERVICAL SPINE FINDINGS Alignment: Normal alignment of the cervical vertebral bodies. Skull base and vertebrae: Normal craniocervical junction. No loss of vertebral body height or disc height. Normal facet articulation. No evidence of fracture. Soft tissues and spinal canal: No prevertebral soft tissue swelling. No perispinal or epidural hematoma. Disc levels:  Disc space narrowing and osteophytosis at C4-C5. Upper chest: Clear Other: None IMPRESSION: 1. No intracranial trauma. 2. Skin laceration superior and inferior to the RIGHT orbit. No orbital injury or fracture. 3. No facial bone  fracture. 4. No cervical spine fracture. 5. Disc osteophytic disease most severe at C4-C5. Electronically Signed   By: Suzy Bouchard M.D.   On: 10/16/2022 10:02    Positive ROS: All other systems have been reviewed and were otherwise negative with the exception of those mentioned in the HPI and as above.  Physical Exam: General:  Alert, no acute distress Psychiatric:  Patient is competent for consent with normal mood and affect   Cardiovascular:  No pedal edema Respiratory:  No wheezing, non-labored breathing GI:  Abdomen is soft and non-tender Skin:  No lesions in the area of chief complaint Neurologic:  Sensation intact distally Lymphatic:  No axillary or cervical lymphadenopathy  Orthopedic Exam:  Left lower extremity Skin intact, Ecchymosis and swelling noted to the knee and distal femur Tenderness to palpation over the distal medial femur and with any motion of the knee Full range of motion testing deferred patient appears to have a 15 to 20 degree flexion contracture of the knee Sensation intact with good dorsiflexion plantarflexion of the left ankle and toes + Dorsalis pedis pulse Foot warm well-perfused with brisk capillary refill  Secondary survey No tenderness to palpation over other bony prominences in the lower extremities or bilateral upper extremities No pain with logroll or simulated axial loading of the right lower extremity All compartments soft No tenderness to palpation over the cervical or thoracic spine, no bony step-off Motor grossly intact throughout, no focal deficits Sensation grossly intact throughout, no focal deficits Good distal pulses and capillary refill on all extremities   X-rays:  X-rays reviewed reports above showing a minimally displaced left distal femur periprosthetic fracture around a previous intramedullary nail the fracture line exiting through the tip of the distal interlocking screw.  No other fracture dislocations noted no other  hardware complications noted.  Assessment: Closed left periprosthetic distal femur fracture around a previous intramedullary nail  Plan: Extensive conversation was had with the patient and the patient's daughter about treatment options both conservative and surgical.  Given the patient's minimally ambulatory baseline status with only standing for transfers the patient and family have elected to move forward with a nonoperative approach to this fracture.  I placed the patient in a hinged knee brace due to her flexion contracture which I locked at her native flexion of about 20 degrees.  The patient should maintain nonweightbearing on the left lower extremity in the brace when being moved or transferring.  She may take the brace off for comfort when laying in bed.  The patient may follow-up in our clinic in 2 weeks for repeat x-rays and further follow-up.  Given the patient's immobilization I recommend that she be started on a anticoagulant to prevent blood clot.  Questions answered to the patient and daughter and they agree with the above plan to proceed with a nonoperative course of management.  Thank you  Steffanie Rainwater MD  Beeper #:  [phone]  10/17/2022 12:59 PM

## 2022-10-17 NOTE — Plan of Care (Signed)

## 2022-10-17 NOTE — Evaluation (Signed)
Physical Therapy Evaluation Patient Details Name: Kari Mcdonald MRN: [id number] DOB: [birth year] Today's Date: 10/17/2022  History of Present Illness  Pt is an 89 y.o. female presenting to ED 10/16/22 s/p fall; laceration to forehead and L knee.  Imaging showing acute nondisplaced L periprosthetic fracture of the distal anterior femur, at the level of the distal aspect of the femoral stem; also skin laceration superior and inferior to the R orbit.  Per ED note 10/16/22 "Spoke with on-call orthopedist, Dr. Posey Pronto, who recommended conservative management with knee immobilizer and nonweightbearing".  PMH includes COPD, CAD, htn, PAD, anemia, MI, HOH, L IMN intertrochanteric 2018.  Clinical Impression  Prior to hospital admission, pt was modified independent with w/c level mobility/transfers; is a long term care resident; h/o falls.  L LE hinged knee brace (locked at 20 degrees) in place upon PT arrival.  Currently pt is min to mod assist with bed mobility and close SBA with sitting balance; pt declined further mobility d/t L knee pain with L LE movement but pt appearing comfortable at rest (minimal L knee pain at rest).  Pt would benefit from skilled PT to address noted impairments and functional limitations (see below for any additional details).  Upon hospital discharge, pt would benefit from SNF.    Recommendations for follow up therapy are one component of a multi-disciplinary discharge planning process, led by the attending physician.  Recommendations may be updated based on patient status, additional functional criteria and insurance authorization.  Follow Up Recommendations Skilled nursing-short term rehab (<3 hours/day) Can patient physically be transported by private vehicle: No    Assistance Recommended at Discharge Frequent or constant Supervision/Assistance  Patient can return home with the following  Two people to help with walking and/or transfers;A lot of help with  bathing/dressing/bathroom;Assistance with cooking/housework;Assist for transportation;Help with stairs or ramp for entrance    Equipment Recommendations BSC/3in1;Wheelchair (measurements PT);Wheelchair cushion (measurements PT)  Recommendations for Other Services       Functional Status Assessment Patient has had a recent decline in their functional status and demonstrates the ability to make significant improvements in function in a reasonable and predictable amount of time.     Precautions / Restrictions Precautions Precautions: Knee Precaution Comments: L LE hinged knee brace locked in 20 degrees; may take brace off for comfort when laying in bed Required Braces or Orthoses: Knee Immobilizer - Left Restrictions Weight Bearing Restrictions: Yes LLE Weight Bearing: Non weight bearing      Mobility  Bed Mobility Overal bed mobility: Needs Assistance Bed Mobility: Supine to Sit, Sit to Supine     Supine to sit: Min assist, Mod assist, HOB elevated Sit to supine: Mod assist   General bed mobility comments: assist for trunk and L LE; vc's for technique; 2 assist to boost pt up in bed using bed pad end of session    Transfers                   General transfer comment: Pt declined d/t L LE pain with mobility    Ambulation/Gait               General Gait Details: pt non-ambulatory baseline  Stairs            Wheelchair Mobility    Modified Rankin (Stroke Patients Only)       Balance Overall balance assessment: Needs assistance Sitting-balance support: Bilateral upper extremity supported, Feet unsupported Sitting balance-Leahy Scale: Fair Sitting balance - Comments: steady static sitting  edge of bed                                     Pertinent Vitals/Pain Pain Assessment Pain Assessment: Faces Faces Pain Scale: Hurts a little bit (2/10 at rest; 6-8/10 with L LE movement) Pain Location: L knee Pain Descriptors / Indicators:  Discomfort, Grimacing, Guarding, Operative site guarding, Tender Pain Intervention(s): Limited activity within patient's tolerance, Monitored during session, Premedicated before session, Repositioned Vitals (HR and O2 on 2 L via nasal cannula) stable and WFL throughout treatment session.    Home Living Family/patient expects to be discharged to:: Skilled nursing facility                   Additional Comments: Pt has been LTC resident at [facility]. Uses O2 at night.   Prior Function Prior Level of Function : Needs assist             Mobility Comments: Pt modified independent with w/c level transfers/mobility.  Takes self to bathroom on own. ADLs Comments: Assist with showers.     Hand Dominance   Dominant Hand: Right    Extremity/Trunk Assessment   Upper Extremity Assessment Upper Extremity Assessment: Generalized weakness    Lower Extremity Assessment Lower Extremity Assessment: LLE deficits/detail (generalized weakness R LE (at least 3/5 grossly)) LLE Deficits / Details: L LE in hinged knee brace locked at 20 degrees LLE: Unable to fully assess due to pain    Cervical / Trunk Assessment Cervical / Trunk Assessment: Normal  Communication   Communication: HOH  Cognition Arousal/Alertness: Awake/alert Behavior During Therapy: Flat affect Overall Cognitive Status: History of cognitive impairments - at baseline                                 General Comments: Oriented to self, place, and general situation.        General Comments General comments (skin integrity, edema, etc.): L LE in hinged knee brace locked in 20 degrees    Exercises     Assessment/Plan    PT Assessment Patient needs continued PT services  PT Problem List Decreased strength;Decreased range of motion;Decreased activity tolerance;Decreased balance;Decreased mobility;Decreased knowledge of use of DME;Decreased knowledge of precautions;Decreased skin  integrity;Pain       PT Treatment Interventions DME instruction;Functional mobility training;Therapeutic activities;Therapeutic exercise;Balance training;Patient/family education    PT Goals (Current goals can be found in the Care Plan section)  Acute Rehab PT Goals Patient Stated Goal: to improve mobility and pain PT Goal Formulation: With patient/family Time For Goal Achievement: 10/31/22 Potential to Achieve Goals: Good    Frequency 7X/week     Co-evaluation               AM-PAC PT "6 Clicks" Mobility  Outcome Measure Help needed turning from your back to your side while in a flat bed without using bedrails?: A Little Help needed moving from lying on your back to sitting on the side of a flat bed without using bedrails?: A Lot Help needed moving to and from a bed to a chair (including a wheelchair)?: Total Help needed standing up from a chair using your arms (e.g., wheelchair or bedside chair)?: Total Help needed to walk in hospital room?: Total Help needed climbing 3-5 steps with a railing? : Total 6 Click Score: 9  End of Session Equipment Utilized During Treatment: Left knee immobilizer Activity Tolerance: Patient limited by pain Patient left: in bed;with call bell/phone within reach;with bed alarm set;with family/visitor present;Other (comment) (B LE's elevated via pillows with heel floating) Nurse Communication: Mobility status;Precautions;Weight bearing status PT Visit Diagnosis: Other abnormalities of gait and mobility (R26.89);Muscle weakness (generalized) (M62.81);History of falling (Z91.81);Pain Pain - Right/Left: Left Pain - part of body: Knee    Time: 0102-7253 PT Time Calculation (min) (ACUTE ONLY): 23 min   Charges:   PT Evaluation $PT Eval Low Complexity: 1 Low PT Treatments $Therapeutic Activity: 8-22 mins       Leitha Bleak, PT 10/17/22, 3:18 PM

## 2022-11-14 ENCOUNTER — Non-Acute Institutional Stay: Payer: Medicare Other | Admitting: Nurse Practitioner

## 2022-11-14 DIAGNOSIS — R5381 Other malaise: Secondary | ICD-10-CM

## 2022-11-14 DIAGNOSIS — J449 Chronic obstructive pulmonary disease, unspecified: Secondary | ICD-10-CM

## 2022-11-14 DIAGNOSIS — R52 Pain, unspecified: Secondary | ICD-10-CM

## 2022-11-14 DIAGNOSIS — Z515 Encounter for palliative care: Secondary | ICD-10-CM

## 2022-11-14 NOTE — Progress Notes (Addendum)
[facility] Consult Note Telephone: [phone]  Fax: [phone]    Date of encounter: 11/14/22 12:06 PM PATIENT NAME: Kari Mcdonald [id number] Kari Mcdonald   [phone] (home)  DOB: [birth year] MRN: [id number] PRIMARY CARE PROVIDER:    Alvester Morin, MD,  [facility]  RESPONSIBLE PARTY:    Contact Information     Name Relation Home Work Mobile   Kari Mcdonald Daughter [phone]     Kari Mcdonald, [facility]) Kari Mcdonald [phone]     Kari Mcdonald    [phone]     I met face to face with patient in facility. Palliative Care was asked to follow this patient by consultation request of  [facility] to address advance care planning and complex medical decision making. This is a follow up visit.                                  ASSESSMENT AND PLAN / RECOMMENDATIONS:  Symptom Management/Plan: 1. Advance Care Planning;  DNR 2. Goals of Care: Goals include to maximize quality of life and symptom management. Our advance care planning conversation included a discussion about:    The value and importance of advance care planning  Exploration of personal, cultural or spiritual beliefs that might influence medical decisions  Exploration of goals of care in the event of a sudden injury or illness  Identification and preparation of a healthcare agent  Review and updating or creation of an advance directive document. 3. Palliative care encounter; Palliative care encounter; Palliative medicine team will continue to support patient, patient's family, and medical team. Visit consisted of counseling and education dealing with the complex and emotionally intense issues of symptom management and palliative care in the setting of serious and potentially life-threatening illness   4. Debility improving; secondary to COPD, CAD, continue to encourage to be oob, mobility, activities. Fall risk,  restorative exercises. We talked about smoking cessation, declined. Reviewed weights. 11/10/2022 weight 130 lbs  5. Pain controlled currently;  secondary to left femur fracture 10/16/1022 s/p sgy. Reviewed pain on pain scale, current pain regimen with comfortable pain level. Discussed with Kari Mcdonald pain modality with therapy, continues to work with therapy. Kari Mcdonald endorses therapy is helping strengthening, pain, weakness improving. She was pleased.    Follow up Palliative Care Visit: Palliative care will continue to follow for complex medical decision making, advance care planning, and clarification of goals. Return 4 to 8 weeks or prn.   I spent 47 minutes providing this consultation starting at 10:45 am. More than 50% of the time in this consultation was spent in counseling and care coordination. PPS: 50% Chief Complaint: Follow up palliative consult for complex medical decision making, address goals, manage ongoing symptoms   HISTORY OF PRESENT ILLNESS:  Kari Mcdonald is a 89 y.o. year old female  with multiple medical problems including PAD, CAD, HTN, COPD, GERD, h/o GI bleed, osteoporosis, anemia, HLD, h/o hyponatremia, protein calorie malnutrition, depression. Kari. Mcdonald resides at [facility]. Kari. Mcdonald requires assistance with transfers, mobility, adl's. Kari. Mcdonald feeds herself. Purpose of today PC f/u visit further discussion monitor trends of appetite, weights, monitor for functional, cognitive decline with chronic disease progression, assess any active symptoms, supportive role. Recent left femur fx on 10/16/2022 s/p sgy, currently on therapy. At present Kari Mcdonald is sitting in the w/c smoking on the smoking patio. I visited  and observed Kari Mcdonald, talked about purpose pc visit, ros, debility secondary to fall/fracture. We talked about pain at length, currently comfortable. We talked about functional abilities, work with therapy as Kari Mcdonald endorses she is improving. We  talked about importance of mobility, fall risk. We talked about smoking cessation which Kari Mcdonald declined. Medications, medical goals, poc reviewed. No new changes recommended today; Kari Mcdonald is stable, improving debility. Updated staff.  PC f/u visit further discussion monitor trends of appetite, weights, monitor for functional, cognitive decline with chronic disease progression, assess any active symptoms, supportive role.   History obtained from review of EMR, discussion with primary team, and interview with family, facility staff/caregiver and/or Kari. Mcdonald.  I reviewed available labs, medications, imaging, studies and related documents from the EMR.  Records reviewed and summarized above.   Physical Exam: Constitutional: NAD General: frail appearing, pleasant female EYES:  lids intact ENMT: oral mucous membranes moist CV: S1S2, RRR Pulmonary: LCTA Abdomen: normo-active BS + 4 quadrants, soft and non tender MSK: w/c dependent Skin: warm and dry Neuro:  + generalized weakness,  + cognitive impairment Psych: non-anxious affect, A and Oriented to person  Thank you for the opportunity to participate in the care of Kari Mcdonald. Please call our office at [phone] if we can be of additional assistance.   Kari Germani Ihor Gully, NP

## 2023-01-24 ENCOUNTER — Other Ambulatory Visit: Payer: Self-pay

## 2023-01-24 ENCOUNTER — Emergency Department: Payer: Medicare Other

## 2023-01-24 ENCOUNTER — Emergency Department
Admission: EM | Admit: 2023-01-24 | Discharge: 2023-01-24 | Disposition: A | Discharge status: Home / Self Care | Payer: Medicare Other | Attending: Emergency Medicine | Admitting: Emergency Medicine

## 2023-01-24 DIAGNOSIS — S0990XA Unspecified injury of head, initial encounter: Secondary | ICD-10-CM | POA: Diagnosis not present

## 2023-01-24 DIAGNOSIS — J449 Chronic obstructive pulmonary disease, unspecified: Secondary | ICD-10-CM | POA: Insufficient documentation

## 2023-01-24 DIAGNOSIS — I10 Essential (primary) hypertension: Secondary | ICD-10-CM | POA: Diagnosis not present

## 2023-01-24 DIAGNOSIS — I2510 Atherosclerotic heart disease of native coronary artery without angina pectoris: Secondary | ICD-10-CM | POA: Diagnosis not present

## 2023-01-24 DIAGNOSIS — S81012A Laceration without foreign body, left knee, initial encounter: Secondary | ICD-10-CM | POA: Diagnosis not present

## 2023-01-24 DIAGNOSIS — W06XXXA Fall from bed, initial encounter: Secondary | ICD-10-CM | POA: Diagnosis not present

## 2023-01-24 DIAGNOSIS — W19XXXA Unspecified fall, initial encounter: Secondary | ICD-10-CM

## 2023-01-24 DIAGNOSIS — Z23 Encounter for immunization: Secondary | ICD-10-CM | POA: Diagnosis not present

## 2023-01-24 LAB — CBC WITH DIFFERENTIAL/PLATELET
Abs Immature Granulocytes: 0.02 10*3/uL (ref 0.00–0.07)
Basophils Absolute: 0 10*3/uL (ref 0.0–0.1)
Basophils Relative: 0 %
Eosinophils Absolute: 0.5 10*3/uL (ref 0.0–0.5)
Eosinophils Relative: 8 %
HCT: 41.9 % (ref 36.0–46.0)
Hemoglobin: 12.9 g/dL (ref 12.0–15.0)
Immature Granulocytes: 0 %
Lymphocytes Relative: 19 %
Lymphs Abs: 1.2 10*3/uL (ref 0.7–4.0)
MCH: 28 pg (ref 26.0–34.0)
MCHC: 30.8 g/dL (ref 30.0–36.0)
MCV: 90.9 fL (ref 80.0–100.0)
Monocytes Absolute: 0.5 10*3/uL (ref 0.1–1.0)
Monocytes Relative: 7 %
Neutro Abs: 4.3 10*3/uL (ref 1.7–7.7)
Neutrophils Relative %: 66 %
Platelets: 196 10*3/uL (ref 150–400)
RBC: 4.61 MIL/uL (ref 3.87–5.11)
RDW: 15.5 % (ref 11.5–15.5)
WBC: 6.5 10*3/uL (ref 4.0–10.5)
nRBC: 0 % (ref 0.0–0.2)

## 2023-01-24 LAB — COMPREHENSIVE METABOLIC PANEL
ALT: 10 U/L (ref 0–44)
AST: 19 U/L (ref 15–41)
Albumin: 3.4 g/dL — ABNORMAL LOW (ref 3.5–5.0)
Alkaline Phosphatase: 52 U/L (ref 38–126)
Anion gap: 7 (ref 5–15)
BUN: 15 mg/dL (ref 8–23)
CO2: 28 mmol/L (ref 22–32)
Calcium: 9.7 mg/dL (ref 8.9–10.3)
Chloride: 101 mmol/L (ref 98–111)
Creatinine, Ser: 0.59 mg/dL (ref 0.44–1.00)
GFR, Estimated: >60 mL/min (ref >60)
Glucose, Bld: 92 mg/dL (ref 70–99)
Potassium: 4.3 mmol/L (ref 3.5–5.1)
Sodium: 136 mmol/L (ref 135–145)
Total Bilirubin: 0.6 mg/dL (ref 0.3–1.2)
Total Protein: 6.6 g/dL (ref 6.5–8.1)

## 2023-01-24 LAB — TROPONIN I (HIGH SENSITIVITY)
Troponin I (High Sensitivity): 19 ng/L — ABNORMAL HIGH (ref <18)
Troponin I (High Sensitivity): 19 ng/L — ABNORMAL HIGH (ref <18)

## 2023-01-24 MED ORDER — LIDOCAINE HCL (PF) 1 % IJ SOLN
10.0000 mL | Freq: Once | INTRAMUSCULAR | Status: AC
  Administered 2023-01-24: 10 mL
  Filled 2023-01-24: qty 10

## 2023-01-24 MED ORDER — TETANUS-DIPHTH-ACELL PERTUSSIS 5-2.5-18.5 LF-MCG/0.5 IM SUSY
0.5000 mL | PREFILLED_SYRINGE | Freq: Once | INTRAMUSCULAR | Status: AC
  Administered 2023-01-24: 0.5 mL via INTRAMUSCULAR
  Filled 2023-01-24: qty 0.5

## 2023-01-24 NOTE — ED Notes (Signed)
Pt taken to CT.

## 2023-01-24 NOTE — ED Provider Notes (Signed)
[facility] Provider Note    Event Date/Time   First MD Initiated Contact with Patient 01/24/23 0831     (approximate)   History   Fall and Laceration   HPI  Kari Mcdonald is a 89 y.o. female with history of hypertension, COPD, osteoporosis, CAD presents emergency department after a fall.  Patient resides at [facility] in all fields.  States that she was trying to get out of bed, became dizzy, and fell onto the floor landing on the left knee.  Has a large laceration.  Also hit her head on the table.  Patient has history of left hip fracture.  She denies any numbness or tingling.  No chest pain or shortness of breath.      Physical Exam   Triage Vital Signs: ED Triage Vitals [01/24/23 0835]  Enc Vitals Group     BP (!) 169/59     Pulse Rate 63     Resp 16     Temp 97.8 F (36.6 C)     Temp Source Oral     SpO2 95 %     Weight 124 lb (56.2 kg)     Height  (1.676 m)     Head Circumference      Peak Flow      Pain Score 0     Pain Loc      Pain Edu?      Excl. in GC?     Most recent vital signs: Vitals:   01/24/23 0835  BP: (!) 169/59  Pulse: 63  Resp: 16  Temp: 97.8 F (36.6 C)  SpO2: 95%     General: Awake, no distress.   CV:  Good peripheral perfusion. regular rate and  rhythm Resp:  Normal effort. Lungs cta Abd:  No distention.   Other:  Forehead tender palpation, C-spine mildly tender, left knee shows a very large laceration anteriorly along with large amount of swelling and tenderness, left hip is mildly tender   ED Results / Procedures / Treatments   Labs (all labs ordered are listed, but only abnormal results are displayed) Labs Reviewed  COMPREHENSIVE METABOLIC PANEL - Abnormal; Notable for the following components:      Result Value   Albumin 3.4 (*)    All other components within normal limits  TROPONIN I (HIGH SENSITIVITY) - Abnormal; Notable for the following components:   Troponin I (High Sensitivity)  19 (*)    All other components within normal limits  TROPONIN I (HIGH SENSITIVITY) - Abnormal; Notable for the following components:   Troponin I (High Sensitivity) 19 (*)    All other components within normal limits  CBC WITH DIFFERENTIAL/PLATELET  URINALYSIS, ROUTINE W REFLEX MICROSCOPIC     EKG  EKG   RADIOLOGY X-ray of the left knee, left hip CT of the head and cervical spine    PROCEDURES:   .Marland KitchenLaceration Repair  Date/Time: 01/24/2023 12:14 PM  Performed by: Faythe Ghee, PA-C Authorized by: Faythe Ghee, PA-C   Consent:    Consent obtained:  Verbal   Consent given by:  Patient   Risks, benefits, and alternatives were discussed: yes     Risks discussed:  Infection, pain, retained foreign body, tendon damage, poor cosmetic result, need for additional repair, nerve damage, poor wound healing and vascular damage   Alternatives discussed:  Delayed treatment Universal protocol:    Procedure explained and questions answered to patient or proxy's satisfaction: yes  Relevant documents present and verified: yes     Test results available: yes     Imaging studies available: yes     Site/side marked: yes     Immediately prior to procedure, a time out was called: yes     Patient identity confirmed:  Verbally with patient Anesthesia:    Anesthesia method:  Local infiltration   Local anesthetic:  Lidocaine 1% w/o epi Laceration details:    Location:  Leg   Leg location:  L knee   Length (cm):  10 Pre-procedure details:    Preparation:  Patient was prepped and draped in usual sterile fashion and imaging obtained to evaluate for foreign bodies Exploration:    Hemostasis achieved with:  Direct pressure   Imaging obtained: x-ray     Imaging outcome: foreign body not noted     Wound exploration: wound explored through full range of motion and entire depth of wound visualized     Wound extent: areolar tissue not violated, fascia not violated, no foreign body, no  signs of injury, no nerve damage, no tendon damage, no underlying fracture and no vascular damage     Contaminated: no   Treatment:    Area cleansed with:  Povidone-iodine and saline   Amount of cleaning:  Extensive   Irrigation solution:  Sterile saline   Irrigation method:  Syringe and tap   Debridement:  None   Undermining:  None   Scar revision: no   Skin repair:    Repair method:  Sutures   Suture size:  3-0   Suture material:  Nylon   Suture technique:  Simple interrupted   Number of sutures:  12 Approximation:    Approximation:  Close Repair type:    Repair type:  Simple Post-procedure details:    Dressing:  Non-adherent dressing   Procedure completion:  Tolerated well, no immediate complications .Marland KitchenLaceration Repair  Date/Time: 01/24/2023 12:16 PM  Performed by: Faythe Ghee, PA-C Authorized by: Faythe Ghee, PA-C   Consent:    Consent obtained:  Verbal   Consent given by:  Patient   Risks, benefits, and alternatives were discussed: yes     Risks discussed:  Infection, pain, retained foreign body, tendon damage, poor cosmetic result, need for additional repair, nerve damage, poor wound healing and vascular damage   Alternatives discussed:  No treatment Universal protocol:    Procedure explained and questions answered to patient or proxy's satisfaction: yes     Test results available: yes     Imaging studies available: yes     Site/side marked: yes     Immediately prior to procedure, a time out was called: yes     Patient identity confirmed:  Verbally with patient Anesthesia:    Anesthesia method:  None Laceration details:    Location:  Leg   Leg location:  L knee   Length (cm):  2 Pre-procedure details:    Preparation:  Patient was prepped and draped in usual sterile fashion and imaging obtained to evaluate for foreign bodies Exploration:    Hemostasis achieved with:  Direct pressure   Imaging obtained: x-ray     Imaging outcome: foreign body not noted      Wound exploration: wound explored through full range of motion and entire depth of wound visualized     Wound extent: areolar tissue not violated, fascia not violated, no foreign body, no signs of injury, no nerve damage, no tendon damage, no underlying fracture and no vascular damage  Treatment:    Area cleansed with:  Saline   Amount of cleaning:  Standard   Irrigation solution:  Sterile saline   Irrigation method:  Tap Skin repair:    Repair method:  Tissue adhesive and Steri-Strips   Number of Steri-Strips:  2 Approximation:    Approximation:  Close Repair type:    Repair type:  Simple Post-procedure details:    Dressing:  Non-adherent dressing   Procedure completion:  Tolerated well, no immediate complications    MEDICATIONS ORDERED IN ED: Medications  lidocaine (PF) (XYLOCAINE) 1 % injection 10 mL (10 mLs Infiltration Given by Other 01/24/23 1129)  Tdap (BOOSTRIX) injection 0.5 mL (0.5 mLs Intramuscular Given 01/24/23 1135)     IMPRESSION / MDM / ASSESSMENT AND PLAN / ED COURSE  I reviewed the triage vital signs and the nursing notes.                              Differential diagnosis includes, but is not limited to, dizziness, weakness, MI, subdural, cervical fracture, fracture of the left knee, fracture of the left hip, laceration, contusions,  Patient's presentation is most consistent with acute presentation with potential threat to life or bodily function.   Due to the patient's age along with dizziness and fall will do lab work, EKG and imaging.  EKG shows normal sinus rhythm, no STEMI, see physician read  CT of the head and cervical spine were independently reviewed and interpreted by me as being negative for any acute abnormality   X-ray of the left hip and left knee were independently reviewed and interpreted by me, they do show osteopenia but no fracture, hardware from previous surgeries are intact  Patient's labs are reassuring, we did repeat the  troponin to ensure she did not have an event which is also negative  I did consider admission for the patient but she does live at a SNF and feel that she is stable to be discharged to them.  She is not complaining of dizziness or chest pain at this time.  The laceration was repaired.  See procedure note.  Tdap was updated.  She will be transported via EMS back to the skilled nursing facility.  She is in stable condition at this time   FINAL CLINICAL IMPRESSION(S) / ED DIAGNOSES   Final diagnoses:  Injury of head, initial encounter  Fall, initial encounter  Knee laceration, left, initial encounter     Rx / DC Orders   ED Discharge Orders     None        Note:  This document was prepared using Dragon voice recognition software and may include unintentional dictation errors.    Faythe Ghee, PA-C 01/24/23 1220    Sharman Cheek, MD 01/24/23 [phone]

## 2023-01-24 NOTE — ED Notes (Signed)
EMS at bedside

## 2023-01-24 NOTE — ED Notes (Signed)
Pt given sandwich tray and water 

## 2023-01-24 NOTE — Discharge Instructions (Signed)
Keep the areas dry as possible. Keep the sutured area clean Only use soap and water if the area needs to be cleaned Sutures should be removed in 14 days Return emergency department or see regular doctor if any sign of infection

## 2023-01-24 NOTE — ED Triage Notes (Signed)
Pt to ED via [facility] from [facility]. Pt here today for a fall, per EMS pt has laceration to left knee and has very fragile skin. Pt denies LOC or any pain currently. Pt states she is unsure as to why she fell.

## 2023-01-24 NOTE — ED Notes (Signed)
Pt placed on 2L O2 for comfort.  

## 2023-01-24 NOTE — ED Notes (Signed)
Pt slid up in bed and hospital socks placed. Pt NAD without complaints at this time

## 2023-01-24 NOTE — ED Notes (Signed)
Report called to [facility] at [place], RN hung up before confirming her name.

## 2023-01-28 ENCOUNTER — Other Ambulatory Visit: Payer: Self-pay | Admitting: Orthopedic Surgery

## 2023-01-28 DIAGNOSIS — S72402D Unspecified fracture of lower end of left femur, subsequent encounter for closed fracture with routine healing: Secondary | ICD-10-CM

## 2023-02-19 ENCOUNTER — Ambulatory Visit
Admission: RE | Admit: 2023-02-19 | Discharge: 2023-02-19 | Disposition: A | Discharge status: Home / Self Care | Payer: Medicare Other | Source: Ambulatory Visit | Attending: Orthopedic Surgery | Admitting: Orthopedic Surgery

## 2023-02-19 DIAGNOSIS — S72402D Unspecified fracture of lower end of left femur, subsequent encounter for closed fracture with routine healing: Secondary | ICD-10-CM

## 2023-03-05 ENCOUNTER — Encounter: Payer: Self-pay | Admitting: Nurse Practitioner

## 2023-03-05 ENCOUNTER — Non-Acute Institutional Stay: Payer: Medicare Other | Admitting: Nurse Practitioner

## 2023-03-05 DIAGNOSIS — R634 Abnormal weight loss: Secondary | ICD-10-CM

## 2023-03-05 DIAGNOSIS — R5381 Other malaise: Secondary | ICD-10-CM

## 2023-03-05 DIAGNOSIS — E43 Unspecified severe protein-calorie malnutrition: Secondary | ICD-10-CM

## 2023-03-05 DIAGNOSIS — R630 Anorexia: Secondary | ICD-10-CM

## 2023-03-05 DIAGNOSIS — Z515 Encounter for palliative care: Secondary | ICD-10-CM

## 2023-03-05 DIAGNOSIS — J449 Chronic obstructive pulmonary disease, unspecified: Secondary | ICD-10-CM

## 2023-03-05 NOTE — Progress Notes (Addendum)
Therapist, nutritional Palliative Care Consult Note Telephone: [phone]  Fax: [phone]    Date of encounter: 03/05/23 2:33 PM PATIENT NAME: Kari Mcdonald [address] [place] [address]   [phone] (home)  DOB: May 27, 1932 MRN: [id number] PRIMARY CARE PROVIDER:    Keane Police, MD,  [facility]  RESPONSIBLE PARTY:    Contact Information     Name Relation Home Work Mobile   Kari Mcdonald Daughter [phone]     Kari Mcdonald, Kari Mcdonald [facility] FIRST) Kari Mcdonald [phone]     Kari Mcdonald, Kari Mcdonald    [phone]        I met face to face with patient in facility. Palliative Care was asked to follow this patient by consultation request of  Kari Mcdonald, [facility] to address advance care planning and complex medical decision making. This is a follow up visit.                                  ASSESSMENT AND PLAN / RECOMMENDATIONS:  Symptom Management/Plan: 1. Advance Care Planning;  DNR 2. Goals of Care: Goals include to maximize quality of life and symptom management. Our advance care planning conversation included a discussion about:    The value and importance of advance care planning  Exploration of personal, cultural or spiritual beliefs that might influence medical decisions  Exploration of goals of care in the event of a sudden injury or illness  Identification and preparation of a healthcare agent  Review and updating or creation of an advance directive document. 3. Palliative care encounter; Palliative care encounter; Palliative medicine team will continue to support patient, patient's family, and medical team. Visit consisted of counseling and education dealing with the complex and emotionally intense issues of symptom management and palliative care in the setting of serious and potentially life-threatening illness   4. Debility improving; secondary to COPD, CAD, continue to encourage to be oob, mobility, activities. Fall  risk, restorative exercises. We talked about smoking cessation, declined. Reviewed weights.  5. Anorexia; weight loss/PCM; noted below by facility weights; reviewed medication, continues weights, encourage Kari Mcdonald to eat, supplements, snacks, facility dietician. 11/10/2022 weight 130 lbs 02/26/2023 weight 116.4 lbs 13.6 lbs/4 months; 10.46% Follow up Palliative Care Visit: Palliative care will continue to follow for complex medical decision making, advance care planning, and clarification of goals. Return 2 to 8 weeks or prn.   I spent 45 minutes providing this consultation. More than 50% of the time in this consultation was spent in counseling and care coordination. PPS: 50% Chief Complaint: Follow up palliative consult for complex medical decision making, address goals, manage ongoing symptoms   HISTORY OF PRESENT ILLNESS:  Kari Mcdonald is a 89 y.o. year old female  with multiple medical problems including PAD, CAD, HTN, COPD, GERD, h/o GI bleed, osteoporosis, anemia, HLD, h/o hyponatremia, protein calorie malnutrition, depression. Kari. Mcdonald resides at [facility]. Kari. Mcdonald requires assistance with transfers, mobility, adl's. Kari. Mcdonald feeds herself. Purpose of today PC f/u visit further discussion monitor trends of appetite, weights, monitor for functional, cognitive decline with chronic disease progression, assess any active symptoms, supportive role. Recent left femur fx on 10/16/2022 s/p sgy, currently on therapy. At present Kari Mcdonald is lying in bed, endorses she just returned to bed from being up. We talked about how she has been feeling, denies symptoms of pain, or sob. We talked about functional abilities, daily routine, what brings her  joy, residing at facility. We talked about goals of being oob, We talked about appetite, weights, foods she likes, nutritional provided though limited. We talked about concern about weight loss. Support provided. Kari Mcdonald was engaging,  interactive during pc visit. Medications, medical goals, poc reviewed. Updated staff.  PC f/u visit further discussion monitor trends of appetite, weights, monitor for functional, cognitive decline with chronic disease progression, assess any active symptoms, supportive role.    History obtained from review of EMR, discussion with primary team, and interview with family, facility staff/caregiver and/or Kari. Goon.  I reviewed available labs, medications, imaging, studies and related documents from the EMR.  Records reviewed and summarized above.    Physical Exam: General: frail appearing, pleasant female ENMT: oral mucous membranes moist CV: S1S2, RRR Pulmonary: LCTA Abdomen: normo-active BS + 4 quadrants, soft and non tender Neuro:  + generalized weakness,  + cognitive impairment Psych: non-anxious affect, A and Oriented to person  Thank you for the opportunity to participate in the care of Kari Mcdonald. Please call our office at [phone] if we can be of additional assistance.   Kari Scioneaux [place], NP

## 2024-01-05 DEATH — deceased
